# Patient Record
Sex: Male | Born: 1950 | Race: White | Hispanic: No | Marital: Single | Smoking: Current every day smoker
Health system: Southern US, Community
[De-identification: ages and names within clinical notes are randomized; demographics above are authoritative.]

## PROBLEM LIST (undated history)

## (undated) DIAGNOSIS — J45909 Unspecified asthma, uncomplicated: Secondary | ICD-10-CM

## (undated) DIAGNOSIS — D649 Anemia, unspecified: Secondary | ICD-10-CM

## (undated) DIAGNOSIS — M549 Dorsalgia, unspecified: Secondary | ICD-10-CM

## (undated) DIAGNOSIS — Z5189 Encounter for other specified aftercare: Secondary | ICD-10-CM

## (undated) HISTORY — DX: Dorsalgia, unspecified: M54.9

## (undated) HISTORY — DX: Encounter for other specified aftercare: Z51.89

## (undated) HISTORY — DX: Unspecified asthma, uncomplicated: J45.909

## (undated) HISTORY — DX: Anemia, unspecified: D64.9

---

## 1998-10-01 HISTORY — PX: BACK SURGERY: SHX140

## 2006-08-30 ENCOUNTER — Ambulatory Visit: Payer: Self-pay

## 2009-07-15 ENCOUNTER — Emergency Department: Payer: Self-pay | Admitting: Emergency Medicine

## 2012-04-04 DIAGNOSIS — F5104 Psychophysiologic insomnia: Secondary | ICD-10-CM | POA: Insufficient documentation

## 2012-04-04 DIAGNOSIS — F329 Major depressive disorder, single episode, unspecified: Secondary | ICD-10-CM | POA: Insufficient documentation

## 2012-04-04 DIAGNOSIS — G8929 Other chronic pain: Secondary | ICD-10-CM | POA: Insufficient documentation

## 2019-03-30 ENCOUNTER — Inpatient Hospital Stay
Admission: EM | Admit: 2019-03-30 | Discharge: 2019-04-03 | DRG: 812 | Disposition: A | Discharge status: Home / Self Care | Payer: Medicare Other | Attending: Internal Medicine | Admitting: Internal Medicine

## 2019-03-30 ENCOUNTER — Other Ambulatory Visit: Payer: Self-pay

## 2019-03-30 DIAGNOSIS — K621 Rectal polyp: Secondary | ICD-10-CM | POA: Diagnosis present

## 2019-03-30 DIAGNOSIS — F17210 Nicotine dependence, cigarettes, uncomplicated: Secondary | ICD-10-CM | POA: Diagnosis present

## 2019-03-30 DIAGNOSIS — D649 Anemia, unspecified: Secondary | ICD-10-CM | POA: Diagnosis present

## 2019-03-30 DIAGNOSIS — Z79891 Long term (current) use of opiate analgesic: Secondary | ICD-10-CM | POA: Diagnosis not present

## 2019-03-30 DIAGNOSIS — Z79899 Other long term (current) drug therapy: Secondary | ICD-10-CM

## 2019-03-30 DIAGNOSIS — Z880 Allergy status to penicillin: Secondary | ICD-10-CM | POA: Diagnosis not present

## 2019-03-30 DIAGNOSIS — D490 Neoplasm of unspecified behavior of digestive system: Secondary | ICD-10-CM

## 2019-03-30 DIAGNOSIS — R531 Weakness: Secondary | ICD-10-CM | POA: Diagnosis present

## 2019-03-30 DIAGNOSIS — D62 Acute posthemorrhagic anemia: Secondary | ICD-10-CM

## 2019-03-30 DIAGNOSIS — K5730 Diverticulosis of large intestine without perforation or abscess without bleeding: Secondary | ICD-10-CM | POA: Diagnosis not present

## 2019-03-30 DIAGNOSIS — D500 Iron deficiency anemia secondary to blood loss (chronic): Secondary | ICD-10-CM | POA: Diagnosis present

## 2019-03-30 DIAGNOSIS — Z20828 Contact with and (suspected) exposure to other viral communicable diseases: Secondary | ICD-10-CM | POA: Diagnosis present

## 2019-03-30 DIAGNOSIS — K228 Other specified diseases of esophagus: Secondary | ICD-10-CM | POA: Diagnosis present

## 2019-03-30 DIAGNOSIS — K6289 Other specified diseases of anus and rectum: Secondary | ICD-10-CM | POA: Diagnosis not present

## 2019-03-30 DIAGNOSIS — K635 Polyp of colon: Secondary | ICD-10-CM | POA: Diagnosis not present

## 2019-03-30 DIAGNOSIS — D509 Iron deficiency anemia, unspecified: Secondary | ICD-10-CM | POA: Diagnosis not present

## 2019-03-30 LAB — PREPARE RBC (CROSSMATCH)

## 2019-03-30 LAB — CBC WITH DIFFERENTIAL/PLATELET
Abs Immature Granulocytes: 0.04 10*3/uL (ref 0.00–0.07)
Basophils Absolute: 0 10*3/uL (ref 0.0–0.1)
Basophils Relative: 0 %
Eosinophils Absolute: 0 10*3/uL (ref 0.0–0.5)
Eosinophils Relative: 0 %
HCT: 13 % — CL (ref 39.0–52.0)
Hemoglobin: 3.5 g/dL — CL (ref 13.0–17.0)
Immature Granulocytes: 1 %
Lymphocytes Relative: 7 %
Lymphs Abs: 0.5 10*3/uL — ABNORMAL LOW (ref 0.7–4.0)
MCH: 18 pg — ABNORMAL LOW (ref 26.0–34.0)
MCHC: 26.9 g/dL — ABNORMAL LOW (ref 30.0–36.0)
MCV: 67 fL — ABNORMAL LOW (ref 80.0–100.0)
Monocytes Absolute: 0.8 10*3/uL (ref 0.1–1.0)
Monocytes Relative: 11 %
Neutro Abs: 5.7 10*3/uL (ref 1.7–7.7)
Neutrophils Relative %: 81 %
Platelets: 270 10*3/uL (ref 150–400)
RBC: 1.94 MIL/uL — ABNORMAL LOW (ref 4.22–5.81)
RDW: 16.3 % — ABNORMAL HIGH (ref 11.5–15.5)
Smear Review: NORMAL
WBC: 7 10*3/uL (ref 4.0–10.5)
nRBC: 0 % (ref 0.0–0.2)

## 2019-03-30 LAB — COMPREHENSIVE METABOLIC PANEL
ALT: 17 U/L (ref 0–44)
AST: 19 U/L (ref 15–41)
Albumin: 3.5 g/dL (ref 3.5–5.0)
Alkaline Phosphatase: 68 U/L (ref 38–126)
Anion gap: 8 (ref 5–15)
BUN: 12 mg/dL (ref 8–23)
CO2: 22 mmol/L (ref 22–32)
Calcium: 8.3 mg/dL — ABNORMAL LOW (ref 8.9–10.3)
Chloride: 104 mmol/L (ref 98–111)
Creatinine, Ser: 1.07 mg/dL (ref 0.61–1.24)
GFR calc Af Amer: >60 mL/min (ref >60)
GFR calc non Af Amer: >60 mL/min (ref >60)
Glucose, Bld: 119 mg/dL — ABNORMAL HIGH (ref 70–99)
Potassium: 3.7 mmol/L (ref 3.5–5.1)
Sodium: 134 mmol/L — ABNORMAL LOW (ref 135–145)
Total Bilirubin: 0.3 mg/dL (ref 0.3–1.2)
Total Protein: 6.9 g/dL (ref 6.5–8.1)

## 2019-03-30 LAB — ABO/RH: ABO/RH(D): A POS

## 2019-03-30 MED ORDER — SODIUM CHLORIDE 0.9% FLUSH: 3.0000 mL | INTRAVENOUS | Status: DC | PRN

## 2019-03-30 MED ORDER — SODIUM CHLORIDE 0.9 % IV SOLN: INTRAVENOUS | Status: DC

## 2019-03-30 MED ORDER — PNEUMOCOCCAL VAC POLYVALENT 25 MCG/0.5ML IJ INJ: 0.5000 mL | INJECTION | INTRAMUSCULAR | Status: DC

## 2019-03-30 MED ORDER — ONDANSETRON HCL 4 MG/2ML IJ SOLN: 4.0000 mg | Freq: Four times a day (QID) | INTRAMUSCULAR | Status: DC | PRN

## 2019-03-30 MED ORDER — SODIUM CHLORIDE 0.9% FLUSH
3.0000 mL | Freq: Two times a day (BID) | INTRAVENOUS | Status: DC
  Administered 2019-03-30 – 2019-04-03 (×6): 3 mL via INTRAVENOUS

## 2019-03-30 MED ORDER — ONDANSETRON HCL 4 MG PO TABS: 4.0000 mg | ORAL_TABLET | Freq: Four times a day (QID) | ORAL | Status: DC | PRN

## 2019-03-30 MED ORDER — SENNOSIDES-DOCUSATE SODIUM 8.6-50 MG PO TABS: 1.0000 | ORAL_TABLET | Freq: Every evening | ORAL | Status: DC | PRN

## 2019-03-30 MED ORDER — ACETAMINOPHEN 325 MG PO TABS: 650.0000 mg | ORAL_TABLET | Freq: Four times a day (QID) | ORAL | Status: DC | PRN

## 2019-03-30 MED ORDER — ACETAMINOPHEN 325 MG PO TABS
650.0000 mg | ORAL_TABLET | Freq: Once | ORAL | Status: AC
  Administered 2019-03-30: 650 mg via ORAL
  Filled 2019-03-30: qty 2

## 2019-03-30 MED ORDER — ACETAMINOPHEN 650 MG RE SUPP: 650.0000 mg | Freq: Four times a day (QID) | RECTAL | Status: DC | PRN

## 2019-03-30 MED ORDER — PANTOPRAZOLE SODIUM 40 MG IV SOLR
40.0000 mg | Freq: Two times a day (BID) | INTRAVENOUS | Status: DC
  Administered 2019-03-30 – 2019-03-31 (×3): 40 mg via INTRAVENOUS
  Filled 2019-03-30 (×3): qty 40

## 2019-03-30 MED ORDER — SODIUM CHLORIDE 0.9% IV SOLUTION
Freq: Once | INTRAVENOUS | Status: AC
  Administered 2019-03-31: 100 mL via INTRAVENOUS
  Administered 2019-03-31: 150 mL via INTRAVENOUS

## 2019-03-30 MED ORDER — SODIUM CHLORIDE 0.9 % IV SOLN
250.0000 mL | INTRAVENOUS | Status: DC | PRN
  Administered 2019-03-31: 1000 mL via INTRAVENOUS

## 2019-03-30 MED ORDER — DIPHENHYDRAMINE HCL 25 MG PO CAPS
25.0000 mg | ORAL_CAPSULE | Freq: Once | ORAL | Status: AC
  Administered 2019-03-30: 25 mg via ORAL
  Filled 2019-03-30: qty 1

## 2019-03-30 MED ORDER — NICOTINE 21 MG/24HR TD PT24
21.0000 mg | MEDICATED_PATCH | Freq: Every day | TRANSDERMAL | Status: DC
  Administered 2019-03-30 – 2019-04-02 (×4): 21 mg via TRANSDERMAL
  Filled 2019-03-30 (×4): qty 1

## 2019-03-30 MED ORDER — SODIUM CHLORIDE 0.9 % IV SOLN
10.0000 mL/h | Freq: Once | INTRAVENOUS | Status: AC
  Administered 2019-03-30: 10 mL/h via INTRAVENOUS

## 2019-03-30 NOTE — Progress Notes (Signed)
Advanced care plan. Purpose of the Encounter: CODE STATUS Parties in Attendance:Patient Patient's Decision Capacity:Good Subjective/Patient's story: Roberto Collins  is a 68 y.o. male with no significant past medical history presented to the emergency room for generalized weakness.  Patient has fatigue and tiredness.  Also feels dizzy.  He was evaluated in the emergency room hemoglobin is 3.5.  Stool guaiac negative.  No vomiting of blood.  No rectal bleed.  No history of any anemia.  COVID-19 test pending.  No recent travel, sick contacts at home.  No fever chills or cough. Objective/Medical story Needs for blood transfusion intravenously and gastroenterology evaluation.  Will need endoscopy and colonoscopy. Goals of care determination:  Advance care directives and goals of care discussed. Patient wants everything done which includes cpr and intubation and ventilator if need arises CODE STATUS: Full code Time spent discussing advanced care planning: 16 minutes

## 2019-03-30 NOTE — Consult Note (Signed)
[name], MD [address] [place], [place], [place], [address] [id number], [place], [place], [place], [address] Phone: [phone]  Fax: [phone]  Consultation  Referring Provider:     Dr. Estanislado Pandy Primary Care Physician:  Patient, No Pcp Per Reason for Consultation:     Anemia  Date of Admission:  03/30/2019 Date of Consultation:  03/30/2019         HPI:   Roberto Collins is a 68 y.o. male presented with generalized weakness and found to have a hemoglobin of 3.5.  No previous records available. Denies any PMHx. Denies NSAID use.   The patient denies abdominal or flank pain, anorexia, nausea or vomiting, dysphagia, change in bowel habits or black or bloody stools or weight loss.  No prior upper or lower endoscopy.  No family history of colon cancer.  History reviewed. No pertinent past medical history.  Past Surgical History:  Procedure Laterality Date  . BACK SURGERY      Prior to Admission medications   Medication Sig Start Date End Date Taking? Authorizing Provider  chlorproMAZINE (THORAZINE) 100 MG tablet Take 300 mg by mouth Nightly.   Yes [provider]  Cholecalciferol (VITAMIN D-1000 MAX ST) 25 MCG (1000 UT) tablet Take 2,000 Units by mouth daily.   Yes [provider]  cyclobenzaprine (FLEXERIL) 10 MG tablet Take 10 mg by mouth at bedtime.   Yes [provider]  gabapentin (NEURONTIN) 600 MG tablet Take 1,200-1,800 mg by mouth at bedtime.   Yes [provider]  morphine (MS CONTIN) 60 MG 12 hr tablet Take 60 mg by mouth every 12 (twelve) hours.   Yes [provider]  oxyCODONE-acetaminophen (PERCOCET/ROXICET) 5-325 MG tablet Take 1 tablet by mouth 2 (two) times daily as needed. 06/27/09  Yes [provider]    No family history on file.   Social History   Tobacco Use  . Smoking status: Current Some Day Smoker    Packs/day: 0.50    Types: Cigarettes  . Smokeless tobacco: Never Used   Substance Use Topics  . Alcohol use: Never    Frequency: Never  . Drug use: Never    Allergies as of 03/30/2019 - Review Complete 03/30/2019  Allergen Reaction Noted  . Bee venom Anaphylaxis 04/04/2012  . Penicillins  03/30/2019    Review of Systems:    All systems reviewed and negative except where noted in HPI.   Physical Exam:  Vital signs in last 24 hours: Vitals:   03/30/19 1159 03/30/19 1300 03/30/19 1330 03/30/19 1400  BP:  115/60 115/69 118/61  Pulse:  78 73 72  Resp:  13 18 16   SpO2:  100% 100% 100%  Weight: 59 kg     Height: 5\' 9"  (1.753 m)        General:   Pleasant, cooperative in NAD Head:  Normocephalic and atraumatic. Eyes:   No icterus.   Conjunctiva pink. PERRLA. Ears:  Normal auditory acuity. Neck:  Supple; no masses or thyroidomegaly Lungs: Respirations even and unlabored. Lungs clear to auscultation bilaterally.   No wheezes, crackles, or rhonchi.  Abdomen:  Soft, nondistended, nontender. Normal bowel sounds. No appreciable masses or hepatomegaly.  No rebound or guarding.  Neurologic:  Alert and oriented x3;  grossly normal neurologically. Skin:  Intact without significant lesions or rashes. Cervical Nodes:  No significant cervical adenopathy. Psych:  Alert and cooperative. Normal affect.  LAB RESULTS: Recent Labs    03/30/19 1205  WBC 7.0  HGB 3.5*  HCT 13.0*  PLT 270   BMET Recent Labs    03/30/19 1205  NA 134*  K 3.7  CL 104  CO2 22  GLUCOSE 119*  BUN 12  CREATININE 1.07  CALCIUM 8.3*   LFT Recent Labs    03/30/19 1205  PROT 6.9  ALBUMIN 3.5  AST 19  ALT 17  ALKPHOS 68  BILITOT 0.3   PT/INR No results for input(s): LABPROT, INR in the last 72 hours.  STUDIES: No results found.    Impression / Plan:   Roberto Collins is a 68 y.o. y/o male with generalized weakness and admission, found to be acutely anemic with no signs of active GI bleeding  PPI IV twice daily  Continue serial CBCs and transfuse PRN Avoid NSAIDs  Maintain 2 large-bore IV lines Please page GI with any acute hemodynamic changes, or signs of active GI bleeding  Upper endoscopy tomorrow for further evaluation  To prevent dehydration in setting of severe acute anemia, will allow for medical resuscitation with PRBC today before starting colonoscopy prep.  If EGD negative tomorrow, colonoscopy will be needed the next day  I have discussed alternative options, risks & benefits,  which include, but are not limited to, bleeding, infection, perforation,respiratory complication & drug reaction.  The patient agrees with this plan & written consent will be obtained.      Thank you for involving me in the care of this patient.      LOS: 0 days   Virgel Manifold, MD  03/30/2019, 2:41 PM

## 2019-03-30 NOTE — ED Triage Notes (Signed)
Pt reports decreased ability to walk, weakness, dizziness, decreased appetite x2-3 months

## 2019-03-30 NOTE — ED Provider Notes (Addendum)
[facility] Emergency Department Provider Note    ____________________________________________   I have reviewed the triage vital signs and the nursing notes.   HISTORY  Chief Complaint Dizziness and Weakness   History limited by: Not Limited   HPI Roberto Collins is a 68 y.o. male who presents to the emergency department today because of concern for dizziness and weakness. He states the symptoms have been going on for the past 2-3 months. He has found that the symptoms are worse when he tries to walk around. It has been accompanied by shortness of breath. The patient has not had any fevers. Denies any associated chest pain. Has not noticed any bloody or black or tarry stool.    Records reviewed. Per medical record review patient has a history of chronic pain  History reviewed. No pertinent past medical history.  Patient Active Problem List   Diagnosis Date Noted  . Anemia 03/30/2019    Past Surgical History:  Procedure Laterality Date  . BACK SURGERY      Prior to Admission medications   Not on File    Allergies Penicillins  No family history on file.  Social History Social History   Tobacco Use  . Smoking status: Current Some Day Smoker    Packs/day: 0.50    Types: Cigarettes  . Smokeless tobacco: Never Used  Substance Use Topics  . Alcohol use: Never    Frequency: Never  . Drug use: Never    Review of Systems Constitutional: No fever/chills. Positive for weakness.  Eyes: No visual changes. ENT: No sore throat. Cardiovascular: Denies chest pain. Respiratory: Positive for shortness of breath. Gastrointestinal: No abdominal pain.  No nausea, no vomiting.  No diarrhea.   Genitourinary: Negative for dysuria. Musculoskeletal: Positive for chronic back pain. Skin: Negative for rash. Neurological: Negative for headaches, focal weakness or numbness.  ____________________________________________   PHYSICAL EXAM:  VITAL  SIGNS: ED Triage Vitals  Enc Vitals Group     BP 03/30/19 1300 115/60     Pulse Rate 03/30/19 1300 78     Resp 03/30/19 1300 13     Temp --      Temp src --      SpO2 03/30/19 1300 100 %     Weight 03/30/19 1159 130 lb (59 kg)     Height 03/30/19 1159 5\' 9"  (1.753 m)     Head Circumference --      Peak Flow --      Pain Score 03/30/19 1158 7   Constitutional: Alert and oriented.  Eyes: Conjunctivae are normal.  ENT      Head: Normocephalic and atraumatic.      Nose: No congestion/rhinnorhea.      Mouth/Throat: Mucous membranes are moist and pale.       Neck: No stridor. Hematological/Lymphatic/Immunilogical: No cervical lymphadenopathy. Cardiovascular: Normal rate, regular rhythm.  No murmurs, rubs, or gallops. Respiratory: Normal respiratory effort without tachypnea nor retractions. Breath sounds are clear and equal bilaterally. No wheezes/rales/rhonchi. Gastrointestinal: Soft and non tender. No rebound. No guarding.  Rectal: GUIAC negative Musculoskeletal: Normal range of motion in all extremities. No lower extremity edema. Neurologic:  Normal speech and language. No gross focal neurologic deficits are appreciated.  Skin:  Skin is warm, dry and intact. No rash noted. Psychiatric: Mood and affect are normal. Speech and behavior are normal. Patient exhibits appropriate insight and judgment.  ____________________________________________    LABS (pertinent positives/negatives)  CBC wbc 7.0, hgb 3.5, plt 270 CMP na 134,  k 3.7, glu 119, cr 1.07  ____________________________________________   EKG  I, Nance Pear, attending physician, personally viewed and interpreted this EKG  EKG Time: 1201 Rate: 77 Rhythm: sinus rhythm with sinus arrythmia Axis: normal Intervals: qtc 441 QRS: narrow ST changes: no st elevation Impression: abnormal ekg   ____________________________________________     RADIOLOGY  None  ____________________________________________   PROCEDURES  Procedures  CRITICAL CARE Performed by: Nance Pear   Total critical care time: 20 minutes  Critical care time was exclusive of separately billable procedures and treating other patients.  Critical care was necessary to treat or prevent imminent or life-threatening deterioration.  Critical care was time spent personally by me on the following activities: development of treatment plan with patient and/or surrogate as well as nursing, discussions with consultants, evaluation of patient's response to treatment, examination of patient, obtaining history from patient or surrogate, ordering and performing treatments and interventions, ordering and review of laboratory studies, ordering and review of radiographic studies, pulse oximetry and re-evaluation of patient's condition.  ____________________________________________   INITIAL IMPRESSION / ASSESSMENT AND PLAN / ED COURSE  Pertinent labs & imaging results that were available during my care of the patient were reviewed by me and considered in my medical decision making (see chart for details).   Patient presented to the emergency department today because of concerns for weakness and shortness of breath.  Patient's blood work was notable for severe anemia.  Guaiac negative.  Do think this likely is been ongoing for the past 2 to 3 months.  Discussed this finding with the patient.  Did consent patient for blood transfusion.  Will plan on admission. ____________________________________________   FINAL CLINICAL IMPRESSION(S) / ED DIAGNOSES  Final diagnoses:  Weakness  Anemia, unspecified type     Note: This dictation was prepared with Dragon dictation. Any transcriptional errors that result from this process are unintentional     Nance Pear, MD 03/30/19 1353    Nance Pear, MD 04/06/19 [phone]

## 2019-03-30 NOTE — Progress Notes (Signed)
Patient temp of 99.59F was communicated to Dr. Jannifer Franklin post 1 unit of RBC transfusion. Tylenol and Benadryl given per order before giving remaining 2 units of blood.

## 2019-03-30 NOTE — H&P (Addendum)
[facility] [place] NAME: Roberto Collins    MR#:  [id number]  DATE OF BIRTH:  [birth year]  DATE OF ADMISSION:  03/30/2019  PRIMARY CARE PHYSICIAN: No primary care provider on file.   REQUESTING/REFERRING PHYSICIAN:   CHIEF COMPLAINT:   Chief Complaint  Patient presents with  . Dizziness  . Weakness    HISTORY OF PRESENT ILLNESS: Roberto Collins  is a 68 y.o. male with no significant past medical history presented to the emergency room for generalized weakness.  Patient has fatigue and tiredness.  Also feels dizzy.  He was evaluated in the emergency room hemoglobin is 3.5.  Stool guaiac negative.  No vomiting of blood.  No rectal bleed.  No history of any anemia.  COVID-19 test pending.  No recent travel, sick contacts at home.  No fever chills or cough.  PAST MEDICAL HISTORY:  No history of copd, diabetes, hypertension  PAST SURGICAL HISTORY:  Past Surgical History:  Procedure Laterality Date  . BACK SURGERY      SOCIAL HISTORY:  Social History   Tobacco Use  . Smoking status: Current Some Day Smoker    Packs/day: 0.50    Types: Cigarettes  . Smokeless tobacco: Never Used  Substance Use Topics  . Alcohol use: Never    Frequency: Never    FAMILY HISTORY: No family history on file.  DRUG ALLERGIES:  Allergies  Allergen Reactions  . Penicillins     REVIEW OF SYSTEMS:   CONSTITUTIONAL: No fever, has fatigue and weakness.  EYES: No blurred or double vision.  EARS, NOSE, AND THROAT: No tinnitus or ear pain.  RESPIRATORY: No cough, shortness of breath, wheezing or hemoptysis.  CARDIOVASCULAR: No chest pain, orthopnea, edema.  GASTROINTESTINAL: No nausea, vomiting, diarrhea or abdominal pain.  GENITOURINARY: No dysuria, hematuria.  ENDOCRINE: No polyuria, nocturia,  HEMATOLOGY: No anemia, easy bruising or bleeding SKIN: No rash or lesion. MUSCULOSKELETAL: No joint pain or arthritis.   NEUROLOGIC: No tingling, numbness,  weakness.  PSYCHIATRY: No anxiety or depression.   MEDICATIONS AT HOME:  Prior to Admission medications   Not on File      PHYSICAL EXAMINATION:   VITAL SIGNS: Blood pressure 115/69, pulse 73, resp. rate 18, height 5\' 9"  (1.753 m), weight 59 kg, SpO2 100 %.  GENERAL:  69 y.o.-year-old patient lying in the bed with no acute distress.  EYES: Pupils equal, round, reactive to light and accommodation. No scleral icterus. Extraocular muscles intact.  HEENT: Head atraumatic, normocephalic. Oropharynx and nasopharynx clear.  Pallor present. NECK:  Supple, no jugular venous distention. No thyroid enlargement, no tenderness.  LUNGS: Normal breath sounds bilaterally, no wheezing, rales,rhonchi or crepitation. No use of accessory muscles of respiration.  CARDIOVASCULAR: S1, S2 normal. No murmurs, rubs, or gallops.  ABDOMEN: Soft, nontender, nondistended. Bowel sounds present. No organomegaly or mass.  EXTREMITIES: No pedal edema, cyanosis, or clubbing.  NEUROLOGIC: Cranial nerves II through XII are intact. Muscle strength 5/5 in all extremities. Sensation intact. Gait not checked.  PSYCHIATRIC: The patient is alert and oriented x 3.  SKIN: No obvious rash, lesion, or ulcer.   LABORATORY PANEL:   CBC Recent Labs  Lab 03/30/19 1205  WBC 7.0  HGB 3.5*  HCT 13.0*  PLT 270  MCV 67.0*  MCH 18.0*  MCHC 26.9*  RDW 16.3*  LYMPHSABS PENDING  MONOABS PENDING  EOSABS PENDING  BASOSABS PENDING   ------------------------------------------------------------------------------------------------------------------  Chemistries  Recent Labs  Lab 03/30/19 1205  NA 134*  K 3.7  CL 104  CO2 22  GLUCOSE 119*  BUN 12  CREATININE 1.07  CALCIUM 8.3*  AST 19  ALT 17  ALKPHOS 68  BILITOT 0.3   ------------------------------------------------------------------------------------------------------------------ estimated creatinine clearance is 56.7 mL/min (by C-G formula based on SCr of 1.07  mg/dL). ------------------------------------------------------------------------------------------------------------------ No results for input(s): TSH, T4TOTAL, T3FREE, THYROIDAB in the last 72 hours.  Invalid input(s): FREET3   Coagulation profile No results for input(s): INR, PROTIME in the last 168 hours. ------------------------------------------------------------------------------------------------------------------- No results for input(s): DDIMER in the last 72 hours. -------------------------------------------------------------------------------------------------------------------  Cardiac Enzymes No results for input(s): CKMB, TROPONINI, MYOGLOBIN in the last 168 hours.  Invalid input(s): CK ------------------------------------------------------------------------------------------------------------------ Invalid input(s): POCBNP  ---------------------------------------------------------------------------------------------------------------  Urinalysis No results found for: COLORURINE, APPEARANCEUR, LABSPEC, PHURINE, GLUCOSEU, HGBUR, BILIRUBINUR, KETONESUR, PROTEINUR, UROBILINOGEN, NITRITE, LEUKOCYTESUR   RADIOLOGY: No results found.  EKG: Orders placed or performed during the hospital encounter of 03/30/19  . EKG 12-Lead  . EKG 12-Lead  . ED EKG  . ED EKG    IMPRESSION AND PLAN: 67 year old male patient with no significant past medical history presented to the emergency room for generalized weakness.  Patient has fatigue and tiredness.  Also feels dizzy.  -Symptomatic anemia Admit patient to medical floor Transfused PRBC IV Monitor hemoglobin hematocrit Gastroenterology consult Will rule out GI loss Check with endoscopy colonoscopy  -Dizziness and giddiness secondary to anemia  -DVT prophylaxis subcu Lovenox daily  -Tobacco abuse Tobacco cessation counseled to the patient for 6 minutes Nicotine patch offered  -Covid 19 test pending results  All the  records are reviewed and case discussed with ED provider. Management plans discussed with the patient, family and they are in agreement.  CODE STATUS:Full code  TOTAL TIME TAKING CARE OF THIS PATIENT: 52 minutes.    Saundra Shelling M.D on 03/30/2019 at 1:47 PM  Between 7am to 6pm - Pager - [phone]  After 6pm go to [email or web address] - password [email or web address]  [facility]  Office  [phone]  CC: Primary care physician; No primary care provider on file.

## 2019-03-30 NOTE — ED Notes (Signed)
Date and time results received: 03/30/19   Test: hematocrit hemoglobin  Critical Value: hematocrit 13 Hemoglobin-3.5  Name of Provider Notified: goodman MD  Orders Received? Or Actions Taken?: MD notified

## 2019-03-30 NOTE — ED Notes (Signed)
ED TO INPATIENT HANDOFF REPORT  ED Nurse Name and Phone #: Trease Bremner [phone]  S Name/Age/Gender Roberto Collins 68 y.o. male Room/Bed: ED11A/ED11A  Code Status   Code Status: Not on file  Home/SNF/Other home Patient oriented x 4 Is this baseline? yes  Triage Complete: Triage complete  Chief Complaint dizziness, weakness  Triage Note Pt reports decreased ability to walk, weakness, dizziness, decreased appetite x2-3 months    Allergies Allergies  Allergen Reactions  . Bee Venom Anaphylaxis  . Penicillins     Level of Care/Admitting Diagnosis ED Disposition    ED Disposition Condition [facility] Area: [place] [100120]  Level of Care: Med-Surg [16]  Covid Evaluation: Person Under Investigation (PUI)  Isolation Risk Level: Low Risk/Droplet (Less than 4L [place] supplementation)  Diagnosis: Anemia [426834]  Admitting Physician: Saundra Shelling [[id number]]  Attending Physician: Saundra Shelling [[id number]]  Estimated length of stay: past midnight tomorrow  Certification:: I certify this patient will need inpatient services for at least 2 midnights  PT Class (Do Not Modify): Inpatient [101]  PT Acc Code (Do Not Modify): Private [1]       B Medical/Surgery History History reviewed. No pertinent past medical history. Past Surgical History:  Procedure Laterality Date  . BACK SURGERY       A IV Location/Drains/Wounds Patient Lines/Drains/Airways Status   Active Line/Drains/Airways    Name:   Placement date:   Placement time:   Site:   Days:   Peripheral IV 03/30/19 Left Forearm   03/30/19    1307    Forearm   less than 1   Peripheral IV 03/30/19 Right Forearm   03/30/19    1312    Forearm   less than 1          Intake/Output Last 24 hours No intake or output data in the 24 hours ending 03/30/19 1512  Labs/Imaging Results for orders placed or performed during the hospital encounter of 03/30/19 (from the past 48 hour(s))  CBC with  Differential     Status: Abnormal   Collection Time: 03/30/19 12:05 PM  Result Value Ref Range   WBC 7.0 4.0 - 10.5 K/uL   RBC 1.94 (L) 4.22 - 5.81 MIL/uL   Hemoglobin 3.5 (LL) 13.0 - 17.0 g/dL    Comment: Reticulocyte Hemoglobin testing may be clinically indicated, consider ordering this additional test [id number] THIS CRITICAL RESULT HAS VERIFIED AND BEEN CALLED TO LAURIE Maelynn Moroney BY TORITSEJU KPENOSEN ON 06 29 2020 AT 1238, AND HAS BEEN READ BACK.     HCT 13.0 (LL) 39.0 - 52.0 %    Comment: This critical result has verified and been called to LAURIE Isaic Syler by Toritseju Kpenosen on 06 29 2020 at 1238, and has been read back.    MCV 67.0 (L) 80.0 - 100.0 fL   MCH 18.0 (L) 26.0 - 34.0 pg   MCHC 26.9 (L) 30.0 - 36.0 g/dL   RDW 16.3 (H) 11.5 - 15.5 %   Platelets 270 150 - 400 K/uL   nRBC 0.0 0.0 - 0.2 %   Neutrophils Relative % 81 %   Neutro Abs 5.7 1.7 - 7.7 K/uL   Lymphocytes Relative 7 %   Lymphs Abs 0.5 (L) 0.7 - 4.0 K/uL   Monocytes Relative 11 %   Monocytes Absolute 0.8 0.1 - 1.0 K/uL   Eosinophils Relative 0 %   Eosinophils Absolute 0.0 0.0 - 0.5 K/uL   Basophils Relative 0 %   Basophils  Absolute 0.0 0.0 - 0.1 K/uL   WBC Morphology MORPHOLOGY UNREMARKABLE    Smear Review Normal platelet morphology    Immature Granulocytes 1 %   Abs Immature Granulocytes 0.04 0.00 - 0.07 K/uL   Schistocytes PRESENT    Stomatocytes PRESENT     Comment: Performed at [facility], [address] [place], [place] [address]  Comprehensive metabolic panel     Status: Abnormal   Collection Time: 03/30/19 12:05 PM  Result Value Ref Range   Sodium 134 (L) 135 - 145 mmol/L   Potassium 3.7 3.5 - 5.1 mmol/L   Chloride 104 98 - 111 mmol/L   CO2 22 22 - 32 mmol/L   Glucose, Bld 119 (H) 70 - 99 mg/dL   BUN 12 8 - 23 mg/dL   Creatinine, Ser 1.07 0.61 - 1.24 mg/dL   Calcium 8.3 (L) 8.9 - 10.3 mg/dL   Total Protein 6.9 6.5 - 8.1 g/dL   Albumin 3.5 3.5 - 5.0 g/dL   AST 19 15 - 41 U/L   ALT  17 0 - 44 U/L   Alkaline Phosphatase 68 38 - 126 U/L   Total Bilirubin 0.3 0.3 - 1.2 mg/dL   GFR calc non Af Amer >60 >60 mL/min   GFR calc Af Amer >60 >60 mL/min   Anion gap 8 5 - 15    Comment: Performed at [facility], [address], [place], [place] [address]  ABO/Rh     Status: None   Collection Time: 03/30/19 12:05 PM  Result Value Ref Range   ABO/RH(D)      A POS Performed at [facility], [address], [place] [address], [place] [address]   Prepare RBC     Status: None   Collection Time: 03/30/19  1:08 PM  Result Value Ref Range   Order Confirmation      ORDER PROCESSED BY BLOOD BANK Performed at [facility], [address] [place], [place] [address]   Type and screen [place]     Status: None (Preliminary result)   Collection Time: 03/30/19  1:08 PM  Result Value Ref Range   ABO/RH(D) A POS    Antibody Screen NEG    Sample Expiration 04/02/2019,2359    Unit Number [id number]    Blood Component Type RED CELLS,LR    Unit division 00    Status of Unit ALLOCATED    Transfusion Status OK TO TRANSFUSE    Crossmatch Result      Compatible Performed at [facility], [address] [place],  [address]    Unit Number [id number]    Blood Component Type RBC LR PHER2    Unit division 00    Status of Unit ALLOCATED    Transfusion Status OK TO TRANSFUSE    Crossmatch Result Compatible    No results found.  Pending Labs Unresulted Labs (From admission, onward)    Start     Ordered   03/30/19 1308  Novel Coronavirus,NAA,(SEND-OUT TO REF LAB - TAT 24-48 hrs); Hosp Order  (Symptomatic Patients Labs with Precautions )  Once,   STAT    Question:  Current symptoms  Answer:  Other (testing not indicated)   03/30/19 1308   03/30/19 1203  Urinalysis, Complete w Microscopic  ONCE - STAT,   STAT     03/30/19 1204   Signed and Held  HIV antibody (Routine Testing)  Once,   R     Signed and Held  Signed and  Held  Basic metabolic panel  Tomorrow morning,   R     Signed and Held   Signed and Held  CBC  Tomorrow morning,   R     Signed and Held          Vitals/Pain Today's Vitals   03/30/19 1159 03/30/19 1300 03/30/19 1330 03/30/19 1400  BP:  115/60 115/69 118/61  Pulse:  78 73 72  Resp:  13 18 16   SpO2:  100% 100% 100%  Weight: 59 kg     Height: 5\' 9"  (1.753 m)     PainSc:        Isolation Precautions Airborne and Contact precautions  Medications Medications  0.9 %  sodium chloride infusion (has no administration in time range)  nicotine (NICODERM CQ - dosed in mg/24 hours) patch 21 mg (has no administration in time range)  pantoprazole (PROTONIX) injection 40 mg (has no administration in time range)    Mobility High fall risk   Focused Assessments    R Recommendations: See Admitting Provider Note  Report given to:   Additional Notes:

## 2019-03-30 NOTE — ED Notes (Signed)
Attempted to call report, unable to give report at this time  

## 2019-03-31 ENCOUNTER — Inpatient Hospital Stay: Payer: Medicare Other | Admitting: Anesthesiology

## 2019-03-31 ENCOUNTER — Encounter
Admission: EM | Disposition: A | Discharge status: Home / Self Care | Payer: Self-pay | Source: Home / Self Care | Attending: Internal Medicine

## 2019-03-31 ENCOUNTER — Encounter: Payer: Self-pay | Admitting: Anesthesiology

## 2019-03-31 DIAGNOSIS — D509 Iron deficiency anemia, unspecified: Secondary | ICD-10-CM

## 2019-03-31 HISTORY — PX: ESOPHAGOGASTRODUODENOSCOPY: SHX5428

## 2019-03-31 LAB — BASIC METABOLIC PANEL
Anion gap: 5 (ref 5–15)
BUN: 16 mg/dL (ref 8–23)
CO2: 24 mmol/L (ref 22–32)
Calcium: 8 mg/dL — ABNORMAL LOW (ref 8.9–10.3)
Chloride: 105 mmol/L (ref 98–111)
Creatinine, Ser: 0.87 mg/dL (ref 0.61–1.24)
GFR calc Af Amer: >60 mL/min (ref >60)
GFR calc non Af Amer: >60 mL/min (ref >60)
Glucose, Bld: 94 mg/dL (ref 70–99)
Potassium: 3.9 mmol/L (ref 3.5–5.1)
Sodium: 134 mmol/L — ABNORMAL LOW (ref 135–145)

## 2019-03-31 LAB — NOVEL CORONAVIRUS, NAA (HOSP ORDER, SEND-OUT TO REF LAB; TAT 18-24 HRS): SARS-CoV-2, NAA: NOT DETECTED

## 2019-03-31 LAB — CBC
HCT: 21.7 % — ABNORMAL LOW (ref 39.0–52.0)
Hemoglobin: 6.9 g/dL — ABNORMAL LOW (ref 13.0–17.0)
MCH: 24.3 pg — ABNORMAL LOW (ref 26.0–34.0)
MCHC: 31.8 g/dL (ref 30.0–36.0)
MCV: 76.4 fL — ABNORMAL LOW (ref 80.0–100.0)
Platelets: 163 10*3/uL (ref 150–400)
RBC: 2.84 MIL/uL — ABNORMAL LOW (ref 4.22–5.81)
RDW: 21.3 % — ABNORMAL HIGH (ref 11.5–15.5)
WBC: 4.7 10*3/uL (ref 4.0–10.5)
nRBC: 0.6 % — ABNORMAL HIGH (ref 0.0–0.2)

## 2019-03-31 LAB — RETICULOCYTES
Immature Retic Fract: 10 % (ref 2.3–15.9)
RBC.: 2.83 MIL/uL — ABNORMAL LOW (ref 4.22–5.81)
Retic Count, Absolute: 37.4 10*3/uL (ref 19.0–186.0)
Retic Ct Pct: 1.3 % (ref 0.4–3.1)

## 2019-03-31 LAB — IRON AND TIBC
Iron: 343 ug/dL — ABNORMAL HIGH (ref 45–182)
Saturation Ratios: 89 % — ABNORMAL HIGH (ref 17.9–39.5)
TIBC: 383 ug/dL (ref 250–450)
UIBC: 41 ug/dL

## 2019-03-31 LAB — VITAMIN B12: Vitamin B-12: 386 pg/mL (ref 180–914)

## 2019-03-31 LAB — FERRITIN: Ferritin: 10 ng/mL — ABNORMAL LOW (ref 24–336)

## 2019-03-31 LAB — FOLATE: Folate: 6.8 ng/mL (ref >5.9)

## 2019-03-31 LAB — PREPARE RBC (CROSSMATCH)

## 2019-03-31 SURGERY — EGD (ESOPHAGOGASTRODUODENOSCOPY)
Anesthesia: General | Laterality: Left

## 2019-03-31 MED ORDER — PROPOFOL 500 MG/50ML IV EMUL
INTRAVENOUS | Status: AC
  Filled 2019-03-31: qty 50

## 2019-03-31 MED ORDER — SODIUM CHLORIDE 0.9 % IV SOLN
INTRAVENOUS | Status: DC
  Administered 2019-03-31: 14:00:00 via INTRAVENOUS

## 2019-03-31 MED ORDER — CYCLOBENZAPRINE HCL 10 MG PO TABS
10.0000 mg | ORAL_TABLET | Freq: Every day | ORAL | Status: DC
  Administered 2019-03-31 – 2019-04-02 (×3): 10 mg via ORAL
  Filled 2019-03-31 (×3): qty 1

## 2019-03-31 MED ORDER — GABAPENTIN 600 MG PO TABS
1200.0000 mg | ORAL_TABLET | Freq: Every day | ORAL | Status: DC
  Administered 2019-03-31 – 2019-04-02 (×3): 1800 mg via ORAL
  Filled 2019-03-31 (×3): qty 3

## 2019-03-31 MED ORDER — VITAMIN D3 25 MCG (1000 UNIT) PO TABS
2000.0000 [IU] | ORAL_TABLET | Freq: Every day | ORAL | Status: DC
  Administered 2019-04-01 – 2019-04-03 (×3): 2000 [IU] via ORAL
  Filled 2019-03-31 (×6): qty 2

## 2019-03-31 MED ORDER — SODIUM CHLORIDE 0.9 % IV SOLN
200.0000 mg | Freq: Once | INTRAVENOUS | Status: AC
  Administered 2019-03-31: 16:00:00 200 mg via INTRAVENOUS
  Filled 2019-03-31: qty 10

## 2019-03-31 MED ORDER — PEG 3350-KCL-NA BICARB-NACL 420 G PO SOLR
4000.0000 mL | Freq: Once | ORAL | Status: AC
  Administered 2019-03-31: 4000 mL via ORAL
  Filled 2019-03-31: qty 4000

## 2019-03-31 MED ORDER — FOLIC ACID 1 MG PO TABS
1.0000 mg | ORAL_TABLET | Freq: Every day | ORAL | Status: DC
  Administered 2019-03-31 – 2019-04-03 (×4): 1 mg via ORAL
  Filled 2019-03-31 (×4): qty 1

## 2019-03-31 MED ORDER — PROPOFOL 10 MG/ML IV BOLUS
INTRAVENOUS | Status: DC | PRN
  Administered 2019-03-31: 60 mg via INTRAVENOUS
  Administered 2019-03-31: 40 mg via INTRAVENOUS

## 2019-03-31 MED ORDER — CHLORPROMAZINE HCL 50 MG PO TABS
300.0000 mg | ORAL_TABLET | Freq: Every day | ORAL | Status: DC
  Administered 2019-03-31 – 2019-04-02 (×3): 300 mg via ORAL
  Filled 2019-03-31: qty 6
  Filled 2019-03-31: qty 3
  Filled 2019-03-31: qty 6
  Filled 2019-03-31 (×2): qty 3
  Filled 2019-03-31: qty 6

## 2019-03-31 MED ORDER — SODIUM CHLORIDE 0.9% IV SOLUTION
Freq: Once | INTRAVENOUS | Status: AC
  Administered 2019-04-02: 1000 mL via INTRAVENOUS

## 2019-03-31 MED ORDER — OXYCODONE-ACETAMINOPHEN 5-325 MG PO TABS: 1.0000 | ORAL_TABLET | Freq: Four times a day (QID) | ORAL | Status: DC | PRN

## 2019-03-31 MED ORDER — BISACODYL 5 MG PO TBEC
10.0000 mg | DELAYED_RELEASE_TABLET | Freq: Once | ORAL | Status: AC
  Administered 2019-03-31: 14:00:00 10 mg via ORAL
  Filled 2019-03-31 (×2): qty 2

## 2019-03-31 MED ORDER — PROPOFOL 500 MG/50ML IV EMUL
INTRAVENOUS | Status: DC | PRN
  Administered 2019-03-31: 160 ug/kg/min via INTRAVENOUS

## 2019-03-31 MED ORDER — PROPOFOL 10 MG/ML IV BOLUS
INTRAVENOUS | Status: AC
  Filled 2019-03-31: qty 20

## 2019-03-31 MED ORDER — MORPHINE SULFATE ER 15 MG PO TBCR
60.0000 mg | EXTENDED_RELEASE_TABLET | Freq: Two times a day (BID) | ORAL | Status: DC
  Administered 2019-03-31 – 2019-04-03 (×7): 60 mg via ORAL
  Filled 2019-03-31 (×7): qty 4

## 2019-03-31 NOTE — Anesthesia Preprocedure Evaluation (Signed)
Anesthesia Evaluation  Patient identified by MRN, date of birth, ID band Patient awake    Reviewed: Allergy & Precautions, NPO status , Patient's Chart, lab work & pertinent test results, reviewed documented beta blocker date and time   Airway Mallampati: II  TM Distance: >3 FB     Dental  (+) Chipped   Pulmonary Current Smoker,           Cardiovascular      Neuro/Psych    GI/Hepatic   Endo/Other    Renal/GU      Musculoskeletal   Abdominal   Peds  Hematology  (+) anemia ,   Anesthesia Other Findings   Reproductive/Obstetrics                             Anesthesia Physical Anesthesia Plan  ASA: II  Anesthesia Plan: General   Post-op Pain Management:    Induction: Intravenous  PONV Risk Score and Plan:   Airway Management Planned:   Additional Equipment:   Intra-op Plan:   Post-operative Plan:   Informed Consent: I have reviewed the patients History and Physical, chart, labs and discussed the procedure including the risks, benefits and alternatives for the proposed anesthesia with the patient or authorized representative who has indicated his/her understanding and acceptance.       Plan Discussed with: CRNA  Anesthesia Plan Comments:         Anesthesia Quick Evaluation

## 2019-03-31 NOTE — Anesthesia Postprocedure Evaluation (Signed)
Anesthesia Post Note  Patient: Roberto Collins  Procedure(s) Performed: ESOPHAGOGASTRODUODENOSCOPY (EGD) (Left )  Patient location during evaluation: Endoscopy Anesthesia Type: General Level of consciousness: awake and alert Pain management: pain level controlled Vital Signs Assessment: post-procedure vital signs reviewed and stable Respiratory status: spontaneous breathing, nonlabored ventilation, respiratory function stable and patient connected to nasal cannula oxygen Cardiovascular status: blood pressure returned to baseline and stable Postop Assessment: no apparent nausea or vomiting Anesthetic complications: no     Last Vitals:  Vitals:   03/31/19 1058 03/31/19 1205  BP: 124/63 (!) 105/49  Pulse: (!) 59 61  Resp: 20 16  Temp: 36.9 C (!) 36.2 C  SpO2: 98% 99%    Last Pain:  Vitals:   03/31/19 1215  TempSrc:   PainSc: 0-No pain                 Leane Loring S

## 2019-03-31 NOTE — Op Note (Signed)
[facility] Gastroenterology Patient Name: Roberto Collins Procedure Date: 03/31/2019 11:34 AM MRN: [id number] Account #: [id number] Date of Birth: 12-01-1950 Admit Type: Inpatient Age: 68 Room: [facility] ENDO ROOM 1 Gender: Male Note Status: Finalized Procedure:            Upper GI endoscopy Indications:          Unexplained iron deficiency anemia Providers:            Noemy Hallmon B. Bonna Gains MD, MD Referring MD:         Forest Gleason Md, MD (Referring MD) Medicines:            Monitored Anesthesia Care Complications:        No immediate complications. Procedure:            Pre-Anesthesia Assessment:                       - Prior to the procedure, a History and Physical was                        performed, and patient medications, allergies and                        sensitivities were reviewed. The patient's tolerance of                        previous anesthesia was reviewed.                       - The risks and benefits of the procedure and the                        sedation options and risks were discussed with the                        patient. All questions were answered and informed                        consent was obtained.                       - Patient identification and proposed procedure were                        verified prior to the procedure by the physician, the                        nurse, the anesthesiologist, the anesthetist and the                        technician. The procedure was verified in the procedure                        room.                       - ASA Grade Assessment: II - A patient with mild                        systemic disease.  After obtaining informed consent, the endoscope was                        passed under direct vision. Throughout the procedure,                        the patient's blood pressure, pulse, and oxygen                        saturations were monitored continuously. The Endoscope                   was introduced through the mouth, and advanced to the                        second part of duodenum. The upper GI endoscopy was                        accomplished with ease. The patient tolerated the                        procedure well. Findings:      The examined esophagus was normal.      The Z-line was irregular. Guidelines recommend biopsy of salmon colored       mucosa extending 1 cm or more. The Z-line in this case was just       irregular and was not 1 cm or more in length and did not need to be       biopsied.      The entire examined stomach was normal.      The duodenal bulb, second portion of the duodenum and examined duodenum       were normal. Impression:           - Normal esophagus.                       - Z-line irregular.                       - Normal stomach.                       - Normal duodenal bulb, second portion of the duodenum                        and examined duodenum.                       - No specimens collected. Recommendation:       - Perform a colonoscopy tomorrow.                       - Clear liquid diet.                       - Continue present medications.                       - Patient has a contact number available for                        emergencies. The signs and symptoms of potential  delayed complications were discussed with the patient.                        Return to normal activities tomorrow. Written discharge                        instructions were provided to the patient.                       - The findings and recommendations were discussed with                        the patient.                       - Continue Serial CBCs and transfuse PRN                       - D/c PPI Procedure Code(s):    --- Professional ---                       [phone], Esophagogastroduodenoscopy, flexible, transoral;                        diagnostic, including collection of specimen(s) by                         brushing or washing, when performed (separate procedure) Diagnosis Code(s):    --- Professional ---                       K22.8, Other specified diseases of esophagus                       D50.9, Iron deficiency anemia, unspecified CPT copyright 2019 American Medical Association. All rights reserved. The codes documented in this report are preliminary and upon coder review may  be revised to meet current compliance requirements.  [name], MD Margretta Sidle B. Bonna Gains MD, MD 03/31/2019 12:13:24 PM This report has been signed electronically. Number of Addenda: 0 Note Initiated On: 03/31/2019 11:34 AM Estimated Blood Loss: Estimated blood loss: none.      [facility]

## 2019-03-31 NOTE — Anesthesia Post-op Follow-up Note (Signed)
Anesthesia QCDR form completed.        

## 2019-03-31 NOTE — TOC Initial Note (Signed)
Transition of Care [facility]) - Initial/Assessment Note    Patient Details  Name: Roberto Collins MRN: [id number] Date of Birth: 27-Jan-1951  Transition of Care [facility]) CM/SW Contact:    Elza Rafter, RN Phone Number: 03/31/2019, 2:28 PM  Clinical Narrative:   Patient is from home alone.  He states he has been going down hill and is not able to care for himself at home.  He has chronic back problems and is current with his pain doctor-Dr. Luetta Nutting.  [facility] [place] [facility] difficulty.  He does not have any DME at home.  It was reported from RN that patient has bed bugs in the home and his home is being exterminated this week.  His Niece is his support person.  Obtained PASSR and completed FL2.  Pending PT evaluation.                       Patient Goals and CMS Choice        Expected Discharge Plan and Services                                                Prior Living Arrangements/Services                       Activities of Daily Living Home Assistive Devices/Equipment: None ADL Screening (condition at time of admission) Patient's cognitive ability adequate to safely complete daily activities?: Yes Is the patient deaf or have difficulty hearing?: No Does the patient have difficulty seeing, even when wearing glasses/contacts?: No Does the patient have difficulty concentrating, remembering, or making decisions?: No Patient able to express need for assistance with ADLs?: Yes Does the patient have difficulty dressing or bathing?: Yes Independently performs ADLs?: No Communication: Independent Is this a change from baseline?: Change from baseline, expected to last >3 days Dressing (OT): Needs assistance Is this a change from baseline?: Change from baseline, expected to last >3 days Grooming: Needs assistance Is this a change from baseline?: Change from baseline, expected to last >3 days Feeding: Independent Bathing: Needs  assistance Is this a change from baseline?: Change from baseline, expected to last >3 days Toileting: Needs assistance Is this a change from baseline?: Change from baseline, expected to last >3days In/Out Bed: Needs assistance Is this a change from baseline?: Change from baseline, expected to last >3 days Walks in Home: Needs assistance Is this a change from baseline?: Change from baseline, expected to last >3 days Does the patient have difficulty walking or climbing stairs?: Yes Weakness of Legs: Both Weakness of Arms/Hands: None  Permission Sought/Granted                  Emotional Assessment              Admission diagnosis:  Weakness [R53.1] Anemia, unspecified type [D64.9] Patient Active Problem List   Diagnosis Date Noted  . Anemia 03/30/2019   PCP:  Patient, No Pcp Per Pharmacy:   [id number] [place], [place] [place] [address] Phone: [phone] Fax: [phone]     Social Determinants of Health (SDOH) Interventions    Readmission Risk Interventions No flowsheet data found.

## 2019-03-31 NOTE — Progress Notes (Addendum)
Patient was admitted with c/o dizziness and weakness. Blood work revealed H/H=3.5/13. A the beginning of the shift patient has a unit of blood transfusing and was completed . Patient tolerated the transfusion. Per order another 2 units of RBC was transfused. Patient was able to tolerate the transfusion w/o any suspected reactions. All blood was started and completed per policy.  Patient has no acute event overnight, patient remained NPO per order.

## 2019-03-31 NOTE — Plan of Care (Signed)

## 2019-03-31 NOTE — Transfer of Care (Signed)
Immediate Anesthesia Transfer of Care Note  Patient: Roberto Collins  Procedure(s) Performed: ESOPHAGOGASTRODUODENOSCOPY (EGD) (Left )  Patient Location: PACU  Anesthesia Type:General  Level of Consciousness: drowsy  Airway & Oxygen Therapy: Patient Spontanous Breathing and Patient connected to nasal cannula oxygen  Post-op Assessment: Report given to RN and Post -op Vital signs reviewed and stable  Post vital signs: Reviewed and stable  Last Vitals:  Vitals Value Taken Time  BP 105/49 03/31/19 1206  Temp 36.2 C 03/31/19 1205  Pulse 60 03/31/19 1208  Resp 16 03/31/19 1208  SpO2 99 % 03/31/19 1208  Vitals shown include unvalidated device data.  Last Pain:  Vitals:   03/31/19 1205  TempSrc: Tympanic  PainSc:          Complications: No apparent anesthesia complications

## 2019-03-31 NOTE — NC FL2 (Signed)
[place] LEVEL OF CARE SCREENING TOOL     IDENTIFICATION  Patient Name: Roberto Collins Birthdate: 1950/10/03 Sex: male Admission Date (Current Location): 03/30/2019  [place] and [place] Number:  Engineering geologist and Address:  [facility], [address] [place], [place] [address]      Provider Number: [id number]  Attending Physician Name and Address:  [name], MD  Relative Name and Phone Number:  [name] Relative [phone]    Current Level of Care: Hospital Recommended Level of Care: [place] Prior Approval Number:    Date Approved/Denied:   PASRR Number: [id number]  Discharge Plan: SNF    Current Diagnoses: Patient Active Problem List   Diagnosis Date Noted  . Anemia 03/30/2019    Orientation RESPIRATION BLADDER Height & Weight     Self, Time, Situation  Normal Continent Weight: 52.2 kg Height:  5\' 9"  (175.3 cm)  BEHAVIORAL SYMPTOMS/MOOD NEUROLOGICAL BOWEL NUTRITION STATUS      Continent Diet(Currently on Clears)  AMBULATORY STATUS COMMUNICATION OF NEEDS Skin   Limited Assist Verbally Normal                       Personal Care Assistance Level of Assistance  Bathing, Feeding, Dressing Bathing Assistance: Limited assistance Feeding assistance: Independent Dressing Assistance: Limited assistance     Functional Limitations Info  Sight, Hearing, Speech Sight Info: Adequate Hearing Info: Adequate Speech Info: Adequate    SPECIAL CARE FACTORS FREQUENCY  PT (By licensed PT)     PT Frequency: 5x week              Contractures Contractures Info: Not present    Additional Factors Info  Code Status, Allergies Code Status Info: full Allergies Info: bees; PCN           Current Medications (03/31/2019):  This is the current hospital active medication list Current Facility-Administered Medications  Medication Dose Route Frequency Provider Last Rate Last Dose  . 0.9  %  sodium chloride infusion (Manually program via Guardrails IV Fluids)   Intravenous Once [name], MD      . 0.9 %  sodium chloride infusion  250 mL Intravenous PRN Saundra Shelling, MD 50 mL/hr at 03/31/19 1104 1,000 mL at 03/31/19 1104  . acetaminophen (TYLENOL) tablet 650 mg  650 mg Oral Q6H PRN Saundra Shelling, MD       Or  . acetaminophen (TYLENOL) suppository 650 mg  650 mg Rectal Q6H PRN Pyreddy, Reatha Harps, MD      . chlorproMAZINE (THORAZINE) tablet 300 mg  300 mg Oral QHS [name], MD      . Derrill Memo ON 04/01/2019] cholecalciferol (VITAMIN D) tablet 2,000 Units  2,000 Units Oral Daily [name], MD      . cyclobenzaprine (FLEXERIL) tablet 10 mg  10 mg Oral QHS [name], MD      . folic acid (FOLVITE) tablet 1 mg  1 mg Oral Daily [name], MD      . gabapentin (NEURONTIN) tablet 1,200-1,800 mg  1,200-1,800 mg Oral QHS [name], MD      . iron sucrose (VENOFER) 200 mg in sodium chloride 0.9 % 150 mL IVPB  200 mg Intravenous Once [name], MD      . morphine (MS CONTIN) 12 hr tablet 60 mg  60 mg Oral Q12H [name], MD      . nicotine (NICODERM CQ - dosed in mg/24 hours) patch 21 mg  21 mg Transdermal Daily Saundra Shelling, MD   21 mg at 03/30/19 1616  . ondansetron (ZOFRAN) tablet 4 mg  4 mg Oral Q6H PRN Pyreddy, Reatha Harps, MD       Or  . ondansetron (ZOFRAN) injection 4 mg  4 mg Intravenous Q6H PRN Pyreddy, Pavan, MD      . oxyCODONE-acetaminophen (PERCOCET/ROXICET) 5-325 MG per tablet 1 tablet  1 tablet Oral Q6H PRN [name], MD      . pneumococcal 23 valent vaccine (PNU-IMMUNE) injection 0.5 mL  0.5 mL Intramuscular Tomorrow-1000 Pyreddy, Pavan, MD      . polyethylene glycol-electrolytes (NuLYTELY/GoLYTELY) solution 4,000 mL  4,000 mL Oral Once [name] B, MD      . senna-docusate (Senokot-S) tablet 1 tablet  1 tablet Oral QHS PRN Pyreddy, Reatha Harps, MD      . sodium chloride flush (NS) 0.9 % injection 3 mL  3 mL Intravenous Q12H  Saundra Shelling, MD   3 mL at 03/30/19 2119  . sodium chloride flush (NS) 0.9 % injection 3 mL  3 mL Intravenous PRN Saundra Shelling, MD         Discharge Medications: Please see discharge summary for a list of discharge medications.  Relevant Imaging Results:  Relevant Lab Results:   Additional Information Geoffry Paradise, RN  Elza Rafter, RN

## 2019-03-31 NOTE — Progress Notes (Signed)
Initial Nutrition Assessment  DOCUMENTATION CODES:   Underweight  INTERVENTION:   RD will add supplements once diet advanced  Pt likely at refeed risk   NUTRITION DIAGNOSIS:   Inadequate oral intake related to acute illness as evidenced by NPO status  GOAL:   Patient will meet greater than or equal to 90% of their needs  MONITOR:   Diet advancement, Labs, Weight trends, Skin, I & O's  REASON FOR ASSESSMENT:   Malnutrition Screening Tool    ASSESSMENT:   68 y.o. y/o male with generalized weakness and admission, found to be acutely anemic with no signs of active GI bleeding  RD working remotely.  Pt reports decreased appetite and oral intake for the past 2-3 months. Pt currently NPO for EGD/colonoscopy today. Pt eating 100% of meals yesterday prior to NPO. Pt denies any recent weight loss. There is no recent weight history in chart to determine if any significant weight loss. RD will add supplements once diet advanced. Pt likely at refeed risk; recommend monitor electrolytes.   Pt at high risk for malnutrition but unable to diagnose at this time as NFPE cannot be performed.   Medications reviewed and include: nicotine, protonix  Labs reviewed: Na 134(L) Hgb 6.9(L), Hct 21.7(L), MCV 76.4(L), MCH 24.3(L)  Unable to complete Nutrition-Focused physical exam at this time.   Diet Order:   Diet Order            Diet NPO time specified Except for: Sips with Meds  Diet effective midnight             EDUCATION NEEDS:   No education needs have been identified at this time  Skin:  Skin Assessment: Reviewed RN Assessment  Last BM:  6/30- type 4  Height:   Ht Readings from Last 1 Encounters:  03/30/19 5\' 9"  (1.753 m)    Weight:   Wt Readings from Last 1 Encounters:  03/30/19 52.2 kg    Ideal Body Weight:  72.7 kg  BMI:  Body mass index is 17 kg/m.  Estimated Nutritional Needs:   Kcal:  1700-2000kcal/day  Protein:  78-88g/day  Fluid:   >1.3L/day  Koleen Distance MS, RD, LDN Pager #- [phone] Office#- [phone] After Hours Pager: [phone]

## 2019-03-31 NOTE — Progress Notes (Signed)
[place] at [facility]                                                                                                                                                                                  Patient Demographics   Fredi Geiler, is a 68 y.o. male, DOB - 1951/04/08, KTG:[id number]  Admit date - 03/30/2019   Admitting Physician Saundra Shelling, MD  Outpatient Primary MD for the patient is Patient, No Pcp Per   LOS - 1  Subjective: Pt feeling better, denies any chest pain or shortness of breath    Review of Systems:   CONSTITUTIONAL: No documented fever. No fatigue, weakness. No weight gain, no weight loss.  EYES: No blurry or double vision.  ENT: No tinnitus. No postnasal drip. No redness of the oropharynx.  RESPIRATORY: No cough, no wheeze, no hemoptysis. No dyspnea.  CARDIOVASCULAR: No chest pain. No orthopnea. No palpitations. No syncope.  GASTROINTESTINAL: No nausea, no vomiting or diarrhea. No abdominal pain. No melena or hematochezia.  GENITOURINARY: No dysuria or hematuria.  ENDOCRINE: No polyuria or nocturia. No heat or cold intolerance.  HEMATOLOGY: No anemia. No bruising. No bleeding.  INTEGUMENTARY: No rashes. No lesions.  MUSCULOSKELETAL: No arthritis. No swelling. No gout.  NEUROLOGIC: No numbness, tingling, or ataxia. No seizure-type activity.  PSYCHIATRIC: No anxiety. No insomnia. No ADD.    Vitals:   Vitals:   03/31/19 0359 03/31/19 0735 03/31/19 1058 03/31/19 1205  BP: 127/70 120/68 124/63 (!) 105/49  Pulse: (!) 59 65 (!) 59 61  Resp: 18  20 16   Temp: 98.9 F (37.2 C) 97.6 F (36.4 C) 98.4 F (36.9 C) (!) 97.2 F (36.2 C)  TempSrc: Oral Oral Tympanic Tympanic  SpO2: 100% 99% 98% 99%  Weight:      Height:        Wt Readings from Last 3 Encounters:  03/30/19 52.2 kg     Intake/Output Summary (Last 24 hours) at 03/31/2019 1415 Last data filed at 03/31/2019 1151 Gross per 24 hour  Intake 830 ml  Output 401 ml  Net  429 ml    Physical Exam:   GENERAL: Pleasant-appearing in no apparent distress.  HEAD, EYES, EARS, NOSE AND THROAT: Atraumatic, normocephalic. Extraocular muscles are intact. Pupils equal and reactive to light. Sclerae anicteric. No conjunctival injection. No oro-pharyngeal erythema.  NECK: Supple. There is no jugular venous distention. No bruits, no lymphadenopathy, no thyromegaly.  HEART: Regular rate and rhythm,. No murmurs, no rubs, no clicks.  LUNGS: Clear to auscultation bilaterally. No rales or rhonchi. No wheezes.  ABDOMEN: Soft, flat, nontender, nondistended. Has good bowel sounds. No hepatosplenomegaly appreciated.  EXTREMITIES: No evidence  of any cyanosis, clubbing, or peripheral edema.  +2 pedal and radial pulses bilaterally.  NEUROLOGIC: The patient is alert, awake, and oriented x3 with no focal motor or sensory deficits appreciated bilaterally.  SKIN: Moist and warm with no rashes appreciated.  Psych: Not anxious, depressed LN: No inguinal LN enlargement    Antibiotics   Anti-infectives (From admission, onward)   None      Medications   Scheduled Meds: . sodium chloride   Intravenous Once  . chlorproMAZINE  300 mg Oral QHS  . [START ON 04/01/2019] cholecalciferol  2,000 Units Oral Daily  . cyclobenzaprine  10 mg Oral QHS  . gabapentin  1,200-1,800 mg Oral QHS  . morphine  60 mg Oral Q12H  . nicotine  21 mg Transdermal Daily  . pneumococcal 23 valent vaccine  0.5 mL Intramuscular Tomorrow-1000  . polyethylene glycol-electrolytes  4,000 mL Oral Once  . sodium chloride flush  3 mL Intravenous Q12H   Continuous Infusions: . sodium chloride 1,000 mL (03/31/19 1104)   PRN Meds:.sodium chloride, acetaminophen **OR** acetaminophen, ondansetron **OR** ondansetron (ZOFRAN) IV, oxyCODONE-acetaminophen, senna-docusate, sodium chloride flush   Data Review:   Micro Results Recent Results (from the past 240 hour(s))  Novel Coronavirus,NAA,(SEND-OUT TO REF LAB - TAT 24-48  hrs); Hosp Order     Status: None   Collection Time: 03/30/19  1:08 PM   Specimen: Nasopharyngeal Swab; Respiratory  Result Value Ref Range Status   SARS-CoV-2, NAA NOT DETECTED NOT DETECTED Final    Comment: (NOTE) This test was developed and its performance characteristics determined by [employer]. This test has not been FDA cleared or approved. This test has been authorized by FDA under an Emergency Use Authorization (EUA). This test is only authorized for the duration of time the declaration that circumstances exist justifying the authorization of the emergency use of in vitro diagnostic tests for detection of SARS-CoV-2 virus and/or diagnosis of COVID-19 infection under section 564(b)(1) of the Act, 21 U.S.C. 627OJJ-0(K)(9), unless the authorization is terminated or revoked sooner. When diagnostic testing is negative, the possibility of a false negative result should be considered in the context of a patient's recent exposures and the presence of clinical signs and symptoms consistent with COVID-19. An individual without symptoms of COVID-19 and who is not shedding SARS-CoV-2 virus would expect to have a negative (not detected) result in this assay. Performed  At: [facility] [place], [place] [id number] [name] MD JI:[id number]    [place]  Final    Comment: Performed at [facility], [address], [place], [place] [address]    Radiology Reports No results found.   CBC Recent Labs  Lab 03/30/19 1205 03/31/19 0624  WBC 7.0 4.7  HGB 3.5* 6.9*  HCT 13.0* 21.7*  PLT 270 163  MCV 67.0* 76.4*  MCH 18.0* 24.3*  MCHC 26.9* 31.8  RDW 16.3* 21.3*  LYMPHSABS 0.5*  --   MONOABS 0.8  --   EOSABS 0.0  --   BASOSABS 0.0  --     Chemistries  Recent Labs  Lab 03/30/19 1205 03/31/19 0624  NA 134* 134*  K 3.7 3.9  CL 104 105  CO2 22 24  GLUCOSE 119* 94  BUN 12 16  CREATININE 1.07 0.87   CALCIUM 8.3* 8.0*  AST 19  --   ALT 17  --   ALKPHOS 68  --   BILITOT 0.3  --    ------------------------------------------------------------------------------------------------------------------ estimated creatinine clearance is 61.7  mL/min (by C-G formula based on SCr of 0.87 mg/dL). ------------------------------------------------------------------------------------------------------------------ No results for input(s): HGBA1C in the last 72 hours. ------------------------------------------------------------------------------------------------------------------ No results for input(s): CHOL, HDL, LDLCALC, TRIG, CHOLHDL, LDLDIRECT in the last 72 hours. ------------------------------------------------------------------------------------------------------------------ No results for input(s): TSH, T4TOTAL, T3FREE, THYROIDAB in the last 72 hours.  Invalid input(s): FREET3 ------------------------------------------------------------------------------------------------------------------ Recent Labs    03/31/19 0624  FOLATE 6.8  FERRITIN 10*  TIBC 383  IRON 343*  RETICCTPCT 1.3    Coagulation profile No results for input(s): INR, PROTIME in the last 168 hours.  No results for input(s): DDIMER in the last 72 hours.  Cardiac Enzymes No results for input(s): CKMB, TROPONINI, MYOGLOBIN in the last 168 hours.  Invalid input(s): CK ------------------------------------------------------------------------------------------------------------------ Invalid input(s): POCBNP    Assessment & Plan   IMPRESSION AND PLAN: 68 year old male patient with no significant past medical history presented to the emergency room for generalized weakness.  Patient has fatigue and tiredness.  Also feels dizzy.  -Symptomatic anemia Patient is, however EGD.  I have sent an anemia panel. He will need to have 1 more unit of transfusion  -Dizziness and giddiness secondary to anemia  -DVT  prophylaxis subcu Lovenox daily  -Tobacco abuse Tobacco cessation       Code Status Orders  (From admission, onward)         Start     Ordered   03/30/19 1604  Full code  Continuous     03/30/19 1603        Code Status History    This patient has a current code status but no historical code status.   Advance Care Planning Activity           Consults GI  DVT Prophylaxis SCDs Lab Results  Component Value Date   PLT 163 03/31/2019     Time Spent in minutes   35 minutes greater than 50% of time spent in care coordination and counseling patient regarding the condition and plan of care.   [name] M.D on 03/31/2019 at 2:15 PM  Between 7am to 6pm - Pager - [phone]  After 6pm go to [email or web address] - Proofreader  [employer]   Office  [phone]

## 2019-03-31 NOTE — OR Nursing (Signed)
REPORT CALLED TO PRIMARY RN ANGELA ON FLOOR. UPPER GI EXAM WNL. PT SCHEDULED FOR COLONOSCOPY IN AM. TO DRINK COLON PREP THIS EVENING AND WITH RESUME CLEAR LIQUIDS WITH NPO AFTER MIDNIGHT . NURSE AND PT INFORMED

## 2019-04-01 ENCOUNTER — Encounter: Payer: Self-pay | Admitting: Anesthesiology

## 2019-04-01 ENCOUNTER — Encounter
Admission: EM | Disposition: A | Discharge status: Home / Self Care | Payer: Self-pay | Source: Home / Self Care | Attending: Internal Medicine

## 2019-04-01 DIAGNOSIS — D649 Anemia, unspecified: Secondary | ICD-10-CM

## 2019-04-01 LAB — URINALYSIS, COMPLETE (UACMP) WITH MICROSCOPIC
Bacteria, UA: NONE SEEN
Bilirubin Urine: NEGATIVE
Glucose, UA: NEGATIVE mg/dL
Hgb urine dipstick: NEGATIVE
Ketones, ur: NEGATIVE mg/dL
Leukocytes,Ua: NEGATIVE
Nitrite: NEGATIVE
Protein, ur: NEGATIVE mg/dL
Specific Gravity, Urine: 1.009 (ref 1.005–1.030)
Squamous Epithelial / HPF: NONE SEEN (ref 0–5)
pH: 7 (ref 5.0–8.0)

## 2019-04-01 LAB — TYPE AND SCREEN
ABO/RH(D): A POS
Antibody Screen: NEGATIVE
Unit division: 0
Unit division: 0
Unit division: 0
Unit division: 0

## 2019-04-01 LAB — CBC WITH DIFFERENTIAL/PLATELET
Abs Immature Granulocytes: 0.03 10*3/uL (ref 0.00–0.07)
Basophils Absolute: 0 10*3/uL (ref 0.0–0.1)
Basophils Relative: 1 %
Eosinophils Absolute: 0 10*3/uL (ref 0.0–0.5)
Eosinophils Relative: 0 %
HCT: 26.2 % — ABNORMAL LOW (ref 39.0–52.0)
Hemoglobin: 8.3 g/dL — ABNORMAL LOW (ref 13.0–17.0)
Immature Granulocytes: 1 %
Lymphocytes Relative: 21 %
Lymphs Abs: 0.8 10*3/uL (ref 0.7–4.0)
MCH: 24.4 pg — ABNORMAL LOW (ref 26.0–34.0)
MCHC: 31.7 g/dL (ref 30.0–36.0)
MCV: 77.1 fL — ABNORMAL LOW (ref 80.0–100.0)
Monocytes Absolute: 0.5 10*3/uL (ref 0.1–1.0)
Monocytes Relative: 13 %
Neutro Abs: 2.5 10*3/uL (ref 1.7–7.7)
Neutrophils Relative %: 64 %
Platelets: 155 10*3/uL (ref 150–400)
RBC: 3.4 MIL/uL — ABNORMAL LOW (ref 4.22–5.81)
RDW: 20.9 % — ABNORMAL HIGH (ref 11.5–15.5)
WBC: 3.9 10*3/uL — ABNORMAL LOW (ref 4.0–10.5)
nRBC: 0 % (ref 0.0–0.2)

## 2019-04-01 LAB — BPAM RBC
Blood Product Expiration Date: 202006302359
Blood Product Expiration Date: 202006302359
Blood Product Expiration Date: 202007042359
Blood Product Expiration Date: 202007162359
ISSUE DATE / TIME: 202006291644
ISSUE DATE / TIME: 202006292155
ISSUE DATE / TIME: 202006300049
ISSUE DATE / TIME: 202006301511
Unit Type and Rh: 600
Unit Type and Rh: 6200
Unit Type and Rh: 9500
Unit Type and Rh: 9500

## 2019-04-01 LAB — HIV ANTIBODY (ROUTINE TESTING W REFLEX): HIV Screen 4th Generation wRfx: NONREACTIVE

## 2019-04-01 SURGERY — COLONOSCOPY
Anesthesia: General

## 2019-04-01 NOTE — Plan of Care (Signed)
  Problem: Education: Goal: Ability to identify signs and symptoms of gastrointestinal bleeding will improve Outcome: Progressing   Problem: Bowel/Gastric: Goal: Will show no signs and symptoms of gastrointestinal bleeding Outcome: Progressing   Problem: Fluid Volume: Goal: Will show no signs and symptoms of excessive bleeding Outcome: Progressing   Problem: Clinical Measurements: Goal: Complications related to the disease process, condition or treatment will be avoided or minimized Outcome: Progressing   Problem: Education: Goal: Knowledge of General Education information will improve Description: Including pain rating scale, medication(s)/side effects and non-pharmacologic comfort measures Outcome: Progressing   Problem: Health Behavior/Discharge Planning: Goal: Ability to manage health-related needs will improve Outcome: Progressing Note: Patient is now willing to drink his old Golytely prep from last night throughout the remainder of the shift. They will re-attempt his colonoscopy tomorrow AM after a better bowel prep. Patient has made significant progress. Wenda Low [facility]

## 2019-04-01 NOTE — Progress Notes (Signed)
[place] at [facility]                                                                                                                                                                                  Patient Demographics   Roberto Collins, is a 69 y.o. male, DOB - Apr 02, 1951, EXB:[id number]  Admit date - 03/30/2019   Admitting Physician Saundra Shelling, MD  Outpatient Primary MD for the patient is Patient, No Pcp Per   LOS - 2  Subjective: Patient is feeling better trying to drink contrast for his colonoscopy    Review of Systems:   CONSTITUTIONAL: No documented fever. No fatigue, weakness. No weight gain, no weight loss.  EYES: No blurry or double vision.  ENT: No tinnitus. No postnasal drip. No redness of the oropharynx.  RESPIRATORY: No cough, no wheeze, no hemoptysis. No dyspnea.  CARDIOVASCULAR: No chest pain. No orthopnea. No palpitations. No syncope.  GASTROINTESTINAL: No nausea, no vomiting or diarrhea. No abdominal pain. No melena or hematochezia.  GENITOURINARY: No dysuria or hematuria.  ENDOCRINE: No polyuria or nocturia. No heat or cold intolerance.  HEMATOLOGY: No anemia. No bruising. No bleeding.  INTEGUMENTARY: No rashes. No lesions.  MUSCULOSKELETAL: No arthritis. No swelling. No gout.  NEUROLOGIC: No numbness, tingling, or ataxia. No seizure-type activity.  PSYCHIATRIC: No anxiety. No insomnia. No ADD.    Vitals:   Vitals:   03/31/19 1824 03/31/19 2123 04/01/19 0413 04/01/19 0756  BP: 125/66 133/70 (!) 106/59 104/60  Pulse: 70 67 67 79  Resp:  18 16   Temp: 98.5 F (36.9 C) 98.6 F (37 C) 98.5 F (36.9 C) 98.5 F (36.9 C)  TempSrc: Oral Oral Oral Oral  SpO2:  100% 96% 95%  Weight:      Height:        Wt Readings from Last 3 Encounters:  03/30/19 52.2 kg     Intake/Output Summary (Last 24 hours) at 04/01/2019 1429 Last data filed at 04/01/2019 0120 Gross per 24 hour  Intake -  Output 600 ml  Net -600 ml    Physical  Exam:   GENERAL: Pleasant-appearing in no apparent distress.  HEAD, EYES, EARS, NOSE AND THROAT: Atraumatic, normocephalic. Extraocular muscles are intact. Pupils equal and reactive to light. Sclerae anicteric. No conjunctival injection. No oro-pharyngeal erythema.  NECK: Supple. There is no jugular venous distention. No bruits, no lymphadenopathy, no thyromegaly.  HEART: Regular rate and rhythm,. No murmurs, no rubs, no clicks.  LUNGS: Clear to auscultation bilaterally. No rales or rhonchi. No wheezes.  ABDOMEN: Soft, flat, nontender, nondistended. Has good bowel sounds. No hepatosplenomegaly appreciated.  EXTREMITIES: No evidence of any cyanosis, clubbing,  or peripheral edema.  +2 pedal and radial pulses bilaterally.  NEUROLOGIC: The patient is alert, awake, and oriented x3 with no focal motor or sensory deficits appreciated bilaterally.  SKIN: Moist and warm with no rashes appreciated.  Psych: Not anxious, depressed LN: No inguinal LN enlargement    Antibiotics   Anti-infectives (From admission, onward)   None      Medications   Scheduled Meds: . sodium chloride   Intravenous Once  . chlorproMAZINE  300 mg Oral QHS  . cholecalciferol  2,000 Units Oral Daily  . cyclobenzaprine  10 mg Oral QHS  . folic acid  1 mg Oral Daily  . gabapentin  1,200-1,800 mg Oral QHS  . morphine  60 mg Oral Q12H  . nicotine  21 mg Transdermal Daily  . pneumococcal 23 valent vaccine  0.5 mL Intramuscular Tomorrow-1000  . sodium chloride flush  3 mL Intravenous Q12H   Continuous Infusions: . sodium chloride 1,000 mL (03/31/19 1104)   PRN Meds:.sodium chloride, acetaminophen **OR** acetaminophen, ondansetron **OR** ondansetron (ZOFRAN) IV, oxyCODONE-acetaminophen, senna-docusate, sodium chloride flush   Data Review:   Micro Results Recent Results (from the past 240 hour(s))  Novel Coronavirus,NAA,(SEND-OUT TO REF LAB - TAT 24-48 hrs); Hosp Order     Status: None   Collection Time: 03/30/19   1:08 PM   Specimen: Nasopharyngeal Swab; Respiratory  Result Value Ref Range Status   SARS-CoV-2, NAA NOT DETECTED NOT DETECTED Final    Comment: (NOTE) This test was developed and its performance characteristics determined by [employer]. This test has not been FDA cleared or approved. This test has been authorized by FDA under an Emergency Use Authorization (EUA). This test is only authorized for the duration of time the declaration that circumstances exist justifying the authorization of the emergency use of in vitro diagnostic tests for detection of SARS-CoV-2 virus and/or diagnosis of COVID-19 infection under section 564(b)(1) of the Act, 21 U.S.C. 924QAS-3(M)(1), unless the authorization is terminated or revoked sooner. When diagnostic testing is negative, the possibility of a false negative result should be considered in the context of a patient's recent exposures and the presence of clinical signs and symptoms consistent with COVID-19. An individual without symptoms of COVID-19 and who is not shedding SARS-CoV-2 virus would expect to have a negative (not detected) result in this assay. Performed  At: [facility] [place], [place] [id number] [name] MD XQ:[id number]    [place]  Final    Comment: Performed at [facility], [address] [place],  [address]    Radiology Reports No results found.   CBC Recent Labs  Lab 03/30/19 1205 03/31/19 0624 04/01/19 0843  WBC 7.0 4.7 3.9*  HGB 3.5* 6.9* 8.3*  HCT 13.0* 21.7* 26.2*  PLT 270 163 155  MCV 67.0* 76.4* 77.1*  MCH 18.0* 24.3* 24.4*  MCHC 26.9* 31.8 31.7  RDW 16.3* 21.3* 20.9*  LYMPHSABS 0.5*  --  0.8  MONOABS 0.8  --  0.5  EOSABS 0.0  --  0.0  BASOSABS 0.0  --  0.0    Chemistries  Recent Labs  Lab 03/30/19 1205 03/31/19 0624  NA 134* 134*  K 3.7 3.9  CL 104 105  CO2 22 24  GLUCOSE 119* 94  BUN 12 16  CREATININE 1.07  0.87  CALCIUM 8.3* 8.0*  AST 19  --   ALT 17  --   ALKPHOS 68  --   BILITOT 0.3  --    ------------------------------------------------------------------------------------------------------------------  estimated creatinine clearance is 61.7 mL/min (by C-G formula based on SCr of 0.87 mg/dL). ------------------------------------------------------------------------------------------------------------------ No results for input(s): HGBA1C in the last 72 hours. ------------------------------------------------------------------------------------------------------------------ No results for input(s): CHOL, HDL, LDLCALC, TRIG, CHOLHDL, LDLDIRECT in the last 72 hours. ------------------------------------------------------------------------------------------------------------------ No results for input(s): TSH, T4TOTAL, T3FREE, THYROIDAB in the last 72 hours.  Invalid input(s): FREET3 ------------------------------------------------------------------------------------------------------------------ Recent Labs    03/31/19 0624  VITAMINB12 386  FOLATE 6.8  FERRITIN 10*  TIBC 383  IRON 343*  RETICCTPCT 1.3    Coagulation profile No results for input(s): INR, PROTIME in the last 168 hours.  No results for input(s): DDIMER in the last 72 hours.  Cardiac Enzymes No results for input(s): CKMB, TROPONINI, MYOGLOBIN in the last 168 hours.  Invalid input(s): CK ------------------------------------------------------------------------------------------------------------------ Invalid input(s): POCBNP    Assessment & Plan   IMPRESSION AND PLAN: 68 year old male patient with no significant past medical history presented to the emergency room for generalized weakness.  Patient has fatigue and tiredness.  Also feels dizzy.  -Symptomatic anemia EGD is negative for any acute source of GI blood loss Patient will have colonoscopy if negative will need hematology evaluation I have given patient  IV iron  -Dizziness and giddiness secondary to anemia  -DVT prophylaxis subcu Lovenox daily  -Tobacco abuse Tobacco cessation       Code Status Orders  (From admission, onward)         Start     Ordered   03/30/19 1604  Full code  Continuous     03/30/19 1603        Code Status History    This patient has a current code status but no historical code status.   Advance Care Planning Activity           Consults GI  DVT Prophylaxis SCDs Lab Results  Component Value Date   PLT 155 04/01/2019     Time Spent in minutes   35 minutes greater than 50% of time spent in care coordination and counseling patient regarding the condition and plan of care.   [name] M.D on 04/01/2019 at 2:29 PM  Between 7am to 6pm - Pager - [phone]  After 6pm go to [email or web address] - Proofreader  [employer]   Office  [phone]

## 2019-04-01 NOTE — Progress Notes (Signed)
[name], MD [address], [place], [place], [place], [address] [address], [place], [place], [place], [address] Phone: [phone]  Fax: [phone]   Subjective: No signs of active GI bleeding.  Hemoglobin improved   Objective: Exam: Vital signs in last 24 hours: Vitals:   03/31/19 2123 04/01/19 0413 04/01/19 0756 04/01/19 1611  BP: 133/70 (!) 106/59 104/60 118/68  Pulse: 67 67 79 75  Resp: 18 16    Temp: 98.6 F (37 C) 98.5 F (36.9 C) 98.5 F (36.9 C) 97.6 F (36.4 C)  TempSrc: Oral Oral Oral Oral  SpO2: 100% 96% 95% 100%  Weight:      Height:       Weight change:   Intake/Output Summary (Last 24 hours) at 04/01/2019 1718 Last data filed at 04/01/2019 0120 Gross per 24 hour  Intake -  Output 600 ml  Net -600 ml    General: No acute distress, AAO x3 Abd: Soft, NT/ND, No HSM Skin: Warm, no rashes Neck: Supple, Trachea midline   Lab Results: Lab Results  Component Value Date   WBC 3.9 (L) 04/01/2019   HGB 8.3 (L) 04/01/2019   HCT 26.2 (L) 04/01/2019   MCV 77.1 (L) 04/01/2019   PLT 155 04/01/2019   Micro Results: Recent Results (from the past 240 hour(s))  Novel Coronavirus,NAA,(SEND-OUT TO REF LAB - TAT 24-48 hrs); Hosp Order     Status: None   Collection Time: 03/30/19  1:08 PM   Specimen: Nasopharyngeal Swab; Respiratory  Result Value Ref Range Status   SARS-CoV-2, NAA NOT DETECTED NOT DETECTED Final    Comment: (NOTE) This test was developed and its performance characteristics determined by [employer]. This test has not been FDA cleared or approved. This test has been authorized by FDA under an Emergency Use Authorization (EUA). This test is only authorized for the duration of time the declaration that circumstances exist justifying the authorization of the emergency use of in vitro diagnostic tests for detection of SARS-CoV-2 virus and/or diagnosis of COVID-19 infection under section 564(b)(1) of the Act, 21 U.S.C.  614ERX-5(Q)(0), unless the authorization is terminated or revoked sooner. When diagnostic testing is negative, the possibility of a false negative result should be considered in the context of a patient's recent exposures and the presence of clinical signs and symptoms consistent with COVID-19. An individual without symptoms of COVID-19 and who is not shedding SARS-CoV-2 virus would expect to have a negative (not detected) result in this assay. Performed  At: [facility] [place], [place] [id number] [name] MD DT:[id number]    [place]  Final    Comment: Performed at [facility], [address] [place], [place] [address]   Studies/Results: No results found. Medications:  Scheduled Meds: . sodium chloride   Intravenous Once  . chlorproMAZINE  300 mg Oral QHS  . cholecalciferol  2,000 Units Oral Daily  . cyclobenzaprine  10 mg Oral QHS  . folic acid  1 mg Oral Daily  . gabapentin  1,200-1,800 mg Oral QHS  . morphine  60 mg Oral Q12H  . nicotine  21 mg Transdermal Daily  . pneumococcal 23 valent vaccine  0.5 mL Intramuscular Tomorrow-1000  . sodium chloride flush  3 mL Intravenous Q12H   Continuous Infusions: . sodium chloride 1,000 mL (03/31/19 1104)   PRN Meds:.sodium chloride, acetaminophen **OR** acetaminophen, ondansetron **OR** ondansetron (ZOFRAN) IV, oxyCODONE-acetaminophen, senna-docusate, sodium chloride flush   Assessment: Active Problems:   Anemia  Plan: Patient only drank 1 cup of his prep yesterday.  Therefore procedure was canceled today and has been rescheduled to tomorrow  Importance of completing his prep today explained to patient and he verbalized understanding  I have discussed alternative options, risks & benefits,  which include, but are not limited to, bleeding, infection, perforation,respiratory complication & drug reaction.  The patient agrees with this plan & written consent  will be obtained.      LOS: 2 days   [name], MD 04/01/2019, 5:18 PM

## 2019-04-01 NOTE — TOC Progression Note (Signed)
Transition of Care [facility]) - Progression Note    Patient Details  Name: Roberto Collins MRN: [id number] Date of Birth: 05/23/1951  [facility]) CM/SW Contact  Katrina Stack, RN Phone Number: 04/01/2019, 3:13 PM  Clinical Narrative:   Appoint made for July 20 at [facility] to get established with PCP.  Physical therapy has evaluated and no follow up is needed.    Expected Discharge Plan: [place] Barriers to Discharge: Continued Medical Work up  Expected Discharge Plan and Services Expected Discharge Plan: [place]   Discharge Planning Services: CM Consult   Living arrangements for the past 2 months: Single Family Home                                       Social Determinants of Health (SDOH) Interventions    Readmission Risk Interventions No flowsheet data found.

## 2019-04-01 NOTE — TOC Progression Note (Addendum)
Transition of Care [facility]) - Progression Note    Patient Details  Name: Roberto Collins MRN: [id number] Date of Birth: [birth year]  Transition of Care [facility]) CM/SW Contact  Katrina Stack, RN Phone Number: 04/01/2019, 2:10 PM  Clinical Narrative:   Patient had endoscopy 6/30 and for colonoscopy today.  Reported patient had poor bowel prep. prep. Awaiting report regarding whether it will have to be rescheduled. Hemoglobin improved. PT consult pending. Will send FL2 if SNF is recommended.  Informed during progression that patient's home is being fumigated today.  Have asked unit secretary to make initial appointment for patient with [facility] or [facility].    Expected Discharge Plan: [place] Barriers to Discharge: Continued Medical Work up  Expected Discharge Plan and Services Expected Discharge Plan: [place]   Discharge Planning Services: CM Consult   Living arrangements for the past 2 months: Single Family Home                                       Social Determinants of Health (SDOH) Interventions    Readmission Risk Interventions No flowsheet data found.

## 2019-04-01 NOTE — Progress Notes (Signed)
Patient has no acute event overnight..Remained hemodynamically stable. Patient was educated about Golytely drink.

## 2019-04-01 NOTE — Evaluation (Signed)
Physical Therapy Evaluation Patient Details Name: Roberto Collins MRN: [id number] DOB: 21-Nov-1950 Today's Date: 04/01/2019   History of Present Illness  Pt is a 68 y.o. male presenting to hospital 03/30/19 with generalized weaknes, fatigue, and dizzy x2-3 months.  Pt admitted with symptomatic anemia (Hgb 3.5) and s/p multiple blood transfusions.  Pt s/p upper GI endoscopy 03/31/19.  PMH includes back surgery (h/o chronic back problems).  Clinical Impression  Prior to hospital admission, pt was independent but limited with mobility recently d/t weakness and fatigue.  Pt lives alone in 1 level home.  Currently pt is independent with supine to sit bed mobility; independent with transfers; and independent with ambulation 280 feet (no AD).  Overall pt appearing strong and steady without any loss of balance during session's activities.  No acute PT needs identified.  Will discharge pt from PT in house and complete current PT order.  Please re-consult PT if pt's status changes and acute PT needs are identified.    Follow Up Recommendations No PT follow up    Equipment Recommendations  None recommended by PT    Recommendations for Other Services       Precautions / Restrictions Precautions Precautions: Fall Restrictions Weight Bearing Restrictions: No      Mobility  Bed Mobility Overal bed mobility: Independent             General bed mobility comments: Supine to sit without any noted difficulties.  Transfers Overall transfer level: Independent Equipment used: None             General transfer comment: Sit to stand (x1 trial bed and x1 trial recliner) and stand step turn bed to recliner without any noted difficulties; strong stand and controlled descent sitting noted  Ambulation/Gait Ambulation/Gait assistance: Independent Gait Distance (Feet): 280 Feet Assistive device: None Gait Pattern/deviations: WFL(Within Functional Limits)   Gait velocity interpretation: >2.62 ft/sec,  indicative of community ambulatory General Gait Details: steady ambulation noted  Stairs            Wheelchair Mobility    Modified Rankin (Stroke Patients Only)       Balance Overall balance assessment: No apparent balance deficits (not formally assessed)(No loss of balance with ambulation and turning)                                           Pertinent Vitals/Pain Pain Assessment: No/denies pain  Vitals (HR and O2 on room air) stable and WFL throughout treatment session.    Home Living Family/patient expects to be discharged to:: Private residence Living Arrangements: Alone   Type of Home: House Home Access: Stairs to enter Entrance Stairs-Rails: Left Entrance Stairs-Number of Steps: 2 Home Layout: One level Home Equipment: None      Prior Function Level of Independence: Independent         Comments: Pt reports 1 fall about 6 weeks ago d/t weakness (but no injuries)     Hand Dominance        Extremity/Trunk Assessment   Upper Extremity Assessment Upper Extremity Assessment: Overall WFL for tasks assessed    Lower Extremity Assessment Lower Extremity Assessment: Overall WFL for tasks assessed    Cervical / Trunk Assessment Cervical / Trunk Assessment: Normal  Communication   Communication: No difficulties  Cognition Arousal/Alertness: Awake/alert Behavior During Therapy: WFL for tasks assessed/performed Overall Cognitive Status: Within Functional Limits for tasks assessed  General Comments   Nursing cleared pt for participation in physical therapy.  Pt agreeable to PT session.    Exercises     Assessment/Plan    PT Assessment Patent does not need any further PT services  PT Problem List         PT Treatment Interventions      PT Goals (Current goals can be found in the Care Plan section)  Acute Rehab PT Goals Patient Stated Goal: to go home PT Goal  Formulation: With patient Time For Goal Achievement: 04/15/19 Potential to Achieve Goals: Good    Frequency     Barriers to discharge        Co-evaluation               AM-PAC PT "6 Clicks" Mobility  Outcome Measure Help needed turning from your back to your side while in a flat bed without using bedrails?: None Help needed moving from lying on your back to sitting on the side of a flat bed without using bedrails?: None Help needed moving to and from a bed to a chair (including a wheelchair)?: None Help needed standing up from a chair using your arms (e.g., wheelchair or bedside chair)?: None Help needed to walk in hospital room?: None Help needed climbing 3-5 steps with a railing? : None 6 Click Score: 24    End of Session Equipment Utilized During Treatment: Gait belt Activity Tolerance: Patient tolerated treatment well Patient left: in chair;with call bell/phone within reach;with chair alarm set Nurse Communication: Mobility status;Precautions PT Visit Diagnosis: Muscle weakness (generalized) (M62.81)    Time: 1424-1440 PT Time Calculation (min) (ACUTE ONLY): 16 min   Charges:   PT Evaluation $PT Eval Low Complexity: 1 Low         Yajahira Tison, PT 04/01/19, 3:55 PM [phone]

## 2019-04-02 ENCOUNTER — Inpatient Hospital Stay: Payer: Medicare Other | Admitting: Anesthesiology

## 2019-04-02 ENCOUNTER — Encounter: Payer: Self-pay | Admitting: Gastroenterology

## 2019-04-02 ENCOUNTER — Inpatient Hospital Stay: Payer: Medicare Other

## 2019-04-02 ENCOUNTER — Encounter
Admission: EM | Disposition: A | Discharge status: Home / Self Care | Payer: Self-pay | Source: Home / Self Care | Attending: Internal Medicine

## 2019-04-02 DIAGNOSIS — K6289 Other specified diseases of anus and rectum: Secondary | ICD-10-CM

## 2019-04-02 DIAGNOSIS — K635 Polyp of colon: Secondary | ICD-10-CM

## 2019-04-02 DIAGNOSIS — D490 Neoplasm of unspecified behavior of digestive system: Secondary | ICD-10-CM

## 2019-04-02 DIAGNOSIS — K5730 Diverticulosis of large intestine without perforation or abscess without bleeding: Secondary | ICD-10-CM

## 2019-04-02 HISTORY — PX: GIVENS CAPSULE STUDY: SHX5432

## 2019-04-02 HISTORY — PX: COLONOSCOPY WITH PROPOFOL: SHX5780

## 2019-04-02 LAB — CBC
HCT: 25.9 % — ABNORMAL LOW (ref 39.0–52.0)
Hemoglobin: 8.1 g/dL — ABNORMAL LOW (ref 13.0–17.0)
MCH: 24.5 pg — ABNORMAL LOW (ref 26.0–34.0)
MCHC: 31.3 g/dL (ref 30.0–36.0)
MCV: 78.5 fL — ABNORMAL LOW (ref 80.0–100.0)
Platelets: 161 10*3/uL (ref 150–400)
RBC: 3.3 MIL/uL — ABNORMAL LOW (ref 4.22–5.81)
RDW: 22.3 % — ABNORMAL HIGH (ref 11.5–15.5)
WBC: 3.7 10*3/uL — ABNORMAL LOW (ref 4.0–10.5)
nRBC: 0 % (ref 0.0–0.2)

## 2019-04-02 SURGERY — COLONOSCOPY WITH PROPOFOL
Anesthesia: General

## 2019-04-02 MED ORDER — PROPOFOL 500 MG/50ML IV EMUL
INTRAVENOUS | Status: AC
  Filled 2019-04-02: qty 50

## 2019-04-02 MED ORDER — PROPOFOL 500 MG/50ML IV EMUL
INTRAVENOUS | Status: DC | PRN
  Administered 2019-04-02: 100 ug/kg/min via INTRAVENOUS

## 2019-04-02 MED ORDER — IOHEXOL 240 MG/ML SOLN: 25.0000 mL | INTRAMUSCULAR | Status: AC

## 2019-04-02 MED ORDER — MIDAZOLAM HCL 2 MG/2ML IJ SOLN
INTRAMUSCULAR | Status: AC
  Filled 2019-04-02: qty 2

## 2019-04-02 MED ORDER — MIDAZOLAM HCL 2 MG/2ML IJ SOLN
INTRAMUSCULAR | Status: DC | PRN
  Administered 2019-04-02: 1 mg via INTRAVENOUS

## 2019-04-02 MED ORDER — ENSURE ENLIVE PO LIQD: 237.0000 mL | Freq: Two times a day (BID) | ORAL | Status: DC

## 2019-04-02 MED ORDER — FENTANYL CITRATE (PF) 100 MCG/2ML IJ SOLN
INTRAMUSCULAR | Status: DC | PRN
  Administered 2019-04-02: 50 ug via INTRAVENOUS

## 2019-04-02 MED ORDER — FENTANYL CITRATE (PF) 100 MCG/2ML IJ SOLN
INTRAMUSCULAR | Status: AC
  Filled 2019-04-02: qty 2

## 2019-04-02 MED ORDER — ADULT MULTIVITAMIN W/MINERALS CH
1.0000 | ORAL_TABLET | Freq: Every day | ORAL | Status: DC
  Administered 2019-04-03: 1 via ORAL
  Filled 2019-04-02: qty 1

## 2019-04-02 MED ORDER — IOHEXOL 300 MG/ML  SOLN
75.0000 mL | Freq: Once | INTRAMUSCULAR | Status: AC | PRN
  Administered 2019-04-02: 75 mL via INTRAVENOUS

## 2019-04-02 NOTE — Op Note (Signed)
[facility] Patient Name: Roberto Collins Procedure Date: 04/02/2019 9:57 AM MRN: [id number] Account #: [id number] Date of Birth: 1951/05/29 Admit Type: Inpatient Age: 68 Room: [facility] ENDO ROOM 1 Gender: Male Note Status: Finalized Procedure:            Colonoscopy Indications:          Iron deficiency anemia Providers:            Aurel Nguyen B. Bonna Gains MD, MD Referring MD:         Forest Gleason Md, MD (Referring MD) Medicines:            Monitored Anesthesia Care Complications:        No immediate complications. Procedure:            Pre-Anesthesia Assessment:                       - ASA Grade Assessment: III - A patient with severe                        systemic disease.                       - Prior to the procedure, a History and Physical was                        performed, and patient medications, allergies and                        sensitivities were reviewed. The patient's tolerance of                        previous anesthesia was reviewed.                       - The risks and benefits of the procedure and the                        sedation options and risks were discussed with the                        patient. All questions were answered and informed                        consent was obtained.                       - Patient identification and proposed procedure were                        verified prior to the procedure by the physician, the                        nurse, the anesthesiologist, the anesthetist and the                        technician. The procedure was verified in the procedure                        room.                       After  obtaining informed consent, the colonoscope was                        passed under direct vision. Throughout the procedure,                        the patient's blood pressure, pulse, and oxygen                        saturations were monitored continuously. The   Colonoscope was introduced through the anus and                        advanced to the the cecum, identified by appendiceal                        orifice and ileocecal valve. The colonoscopy was                        performed with ease. The patient tolerated the                        procedure well. The quality of the bowel preparation                        was fair. Findings:      The perianal and digital rectal examinations were normal.      5-6 sessile polyps were found in the sigmoid colon, transverse colon and       ascending colon. The polyps were 4 to 7 mm in size. These were not       removed due to patient's sever anemia on this admission.      Multiple diverticula were found in the sigmoid colon and ascending colon.      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       The mass was partially circumferential (involving one-half of the lumen       circumference). No bleeding was present. This was biopsied with a cold       forceps for histology.      The exam was otherwise without abnormality.      Mass also noted on retroflexion      No additional abnormalities were found on retroflexion. Impression:           - Preparation of the colon was fair.                       - 5-6, 4 to 7 mm polyps in the sigmoid colon, in the                        transverse colon and in the ascending colon.                       - Diverticulosis in the sigmoid colon and in the                        ascending colon.                       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                       -  The examination was otherwise normal. Recommendation:       - Refer to a surgeon today.                       - High fiber diet.                       - Advance diet as tolerated.                       - Continue present medications.                       - Await pathology results.                       - Repeat colonoscopy in 3-6 months to remove polyps                         seen after workup for rectal mass is compelte.                       - The findings and recommendations were discussed with                        the patient.                       - Return to primary care physician as previously                        scheduled.                       - Return to my office in 2 weeks. Procedure Code(s):    --- Professional ---                       [phone], Colonoscopy, flexible; with biopsy, single or                        multiple Diagnosis Code(s):    --- Professional ---                       K63.5, Polyp of colon                       D49.0, Neoplasm of unspecified behavior of digestive                        system                       D50.9, Iron deficiency anemia, unspecified                       K57.30, Diverticulosis of large intestine without                        perforation or abscess without bleeding CPT copyright 2019 [employer] Medical [employer]. All rights reserved. The codes documented in this report are preliminary and upon coder review may  be revised to meet current compliance requirements.  [name], MD Margretta Sidle B. Kalonji Zurawski MD, MD 04/02/2019 10:45:24 AM This report  has been signed electronically. Number of Addenda: 0 Note Initiated On: 04/02/2019 9:57 AM Scope Withdrawal Time: 0 hours 14 minutes 48 seconds  Total Procedure Duration: 0 hours 20 minutes 8 seconds  Estimated Blood Loss: Estimated blood loss: none.      [facility]

## 2019-04-02 NOTE — Anesthesia Procedure Notes (Signed)
Performed by: Cook-Martin, Thad Osoria Pre-anesthesia Checklist: Patient identified, Emergency Drugs available, Suction available, Patient being monitored and Timeout performed Patient Re-evaluated:Patient Re-evaluated prior to induction Oxygen Delivery Method: Nasal cannula Preoxygenation: Pre-oxygenation with 100% oxygen Induction Type: IV induction Placement Confirmation: positive ETCO2 and CO2 detector       

## 2019-04-02 NOTE — Anesthesia Post-op Follow-up Note (Signed)
Anesthesia QCDR form completed.        

## 2019-04-02 NOTE — Transfer of Care (Signed)
Immediate Anesthesia Transfer of Care Note  Patient: Roberto Collins  Procedure(s) Performed: COLONOSCOPY WITH PROPOFOL (N/A ) GIVENS CAPSULE STUDY (N/A )  Patient Location: PACU  Anesthesia Type:General  Level of Consciousness: alert  and sedated  Airway & Oxygen Therapy: Patient Spontanous Breathing and Patient connected to nasal cannula oxygen  Post-op Assessment: Report given to RN and Post -op Vital signs reviewed and stable  Post vital signs: Reviewed and stable  Last Vitals:  Vitals Value Taken Time  BP    Temp    Pulse    Resp    SpO2      Last Pain:  Vitals:   04/02/19 0850  TempSrc: Tympanic  PainSc: 0-No pain         Complications: No apparent anesthesia complications

## 2019-04-02 NOTE — Progress Notes (Signed)
[place] at [facility]                                                                                                                                                                                  Patient Demographics   Roberto Collins, is a 68 y.o. male, DOB - 07-17-1951, CXK:[id number]  Admit date - 03/30/2019   Admitting Physician Saundra Shelling, MD  Outpatient Primary MD for the patient is Patient, No Pcp Per   LOS - 3  Subjective: Patient had his colonoscopy which shows a tumor in the rectum which is biopsied    Review of Systems:   CONSTITUTIONAL: No documented fever. No fatigue, weakness. No weight gain, no weight loss.  EYES: No blurry or double vision.  ENT: No tinnitus. No postnasal drip. No redness of the oropharynx.  RESPIRATORY: No cough, no wheeze, no hemoptysis. No dyspnea.  CARDIOVASCULAR: No chest pain. No orthopnea. No palpitations. No syncope.  GASTROINTESTINAL: No nausea, no vomiting or diarrhea. No abdominal pain. No melena or hematochezia.  GENITOURINARY: No dysuria or hematuria.  ENDOCRINE: No polyuria or nocturia. No heat or cold intolerance.  HEMATOLOGY: No anemia. No bruising. No bleeding.  INTEGUMENTARY: No rashes. No lesions.  MUSCULOSKELETAL: No arthritis. No swelling. No gout.  NEUROLOGIC: No numbness, tingling, or ataxia. No seizure-type activity.  PSYCHIATRIC: No anxiety. No insomnia. No ADD.    Vitals:   Vitals:   04/02/19 1030 04/02/19 1040 04/02/19 1050 04/02/19 1100  BP: (!) 92/56 106/61 106/61 113/65  Pulse: (!) 56 (!) 56 (!) 54 60  Resp: 10 10 (!) 9 12  Temp: (!) 96.8 F (36 C)     TempSrc: Tympanic     SpO2: 100% 100% 100% 100%  Weight:      Height:        Wt Readings from Last 3 Encounters:  04/02/19 52.9 kg     Intake/Output Summary (Last 24 hours) at 04/02/2019 1426 Last data filed at 04/02/2019 1033 Gross per 24 hour  Intake 1180 ml  Output 150 ml  Net 1030 ml    Physical Exam:   GENERAL:  Pleasant-appearing in no apparent distress.  HEAD, EYES, EARS, NOSE AND THROAT: Atraumatic, normocephalic. Extraocular muscles are intact. Pupils equal and reactive to light. Sclerae anicteric. No conjunctival injection. No oro-pharyngeal erythema.  NECK: Supple. There is no jugular venous distention. No bruits, no lymphadenopathy, no thyromegaly.  HEART: Regular rate and rhythm,. No murmurs, no rubs, no clicks.  LUNGS: Clear to auscultation bilaterally. No rales or rhonchi. No wheezes.  ABDOMEN: Soft, flat, nontender, nondistended. Has good bowel sounds. No hepatosplenomegaly appreciated.  EXTREMITIES: No evidence of any cyanosis, clubbing,  or peripheral edema.  +2 pedal and radial pulses bilaterally.  NEUROLOGIC: The patient is alert, awake, and oriented x3 with no focal motor or sensory deficits appreciated bilaterally.  SKIN: Moist and warm with no rashes appreciated.  Psych: Not anxious, depressed LN: No inguinal LN enlargement    Antibiotics   Anti-infectives (From admission, onward)   None      Medications   Scheduled Meds: . chlorproMAZINE  300 mg Oral QHS  . cholecalciferol  2,000 Units Oral Daily  . cyclobenzaprine  10 mg Oral QHS  . feeding supplement (ENSURE ENLIVE)  237 mL Oral BID BM  . folic acid  1 mg Oral Daily  . gabapentin  1,200-1,800 mg Oral QHS  . iohexol  25 mL Oral Q1 Hr x 2  . morphine  60 mg Oral Q12H  . [START ON 04/03/2019] multivitamin with minerals  1 tablet Oral Daily  . nicotine  21 mg Transdermal Daily  . pneumococcal 23 valent vaccine  0.5 mL Intramuscular Tomorrow-1000  . sodium chloride flush  3 mL Intravenous Q12H   Continuous Infusions: . sodium chloride 1,000 mL (03/31/19 1104)   PRN Meds:.sodium chloride, acetaminophen **OR** acetaminophen, ondansetron **OR** ondansetron (ZOFRAN) IV, oxyCODONE-acetaminophen, senna-docusate, sodium chloride flush   Data Review:   Micro Results Recent Results (from the past 240 hour(s))  Novel  Coronavirus,NAA,(SEND-OUT TO REF LAB - TAT 24-48 hrs); Hosp Order     Status: None   Collection Time: 03/30/19  1:08 PM   Specimen: Nasopharyngeal Swab; Respiratory  Result Value Ref Range Status   SARS-CoV-2, NAA NOT DETECTED NOT DETECTED Final    Comment: (NOTE) This test was developed and its performance characteristics determined by [employer]. This test has not been FDA cleared or approved. This test has been authorized by FDA under an Emergency Use Authorization (EUA). This test is only authorized for the duration of time the declaration that circumstances exist justifying the authorization of the emergency use of in vitro diagnostic tests for detection of SARS-CoV-2 virus and/or diagnosis of COVID-19 infection under section 564(b)(1) of the Act, 21 U.S.C. 433IRJ-1(O)(8), unless the authorization is terminated or revoked sooner. When diagnostic testing is negative, the possibility of a false negative result should be considered in the context of a patient's recent exposures and the presence of clinical signs and symptoms consistent with COVID-19. An individual without symptoms of COVID-19 and who is not shedding SARS-CoV-2 virus would expect to have a negative (not detected) result in this assay. Performed  At: [facility] [place], [place] [id number] [name] MD SW:[id number]    [place]  Final    Comment: Performed at [facility], [address], [place], [place] [address]    Radiology Reports No results found.   CBC Recent Labs  Lab 03/30/19 1205 03/31/19 0624 04/01/19 0843 04/02/19 0552  WBC 7.0 4.7 3.9* 3.7*  HGB 3.5* 6.9* 8.3* 8.1*  HCT 13.0* 21.7* 26.2* 25.9*  PLT 270 163 155 161  MCV 67.0* 76.4* 77.1* 78.5*  MCH 18.0* 24.3* 24.4* 24.5*  MCHC 26.9* 31.8 31.7 31.3  RDW 16.3* 21.3* 20.9* 22.3*  LYMPHSABS 0.5*  --  0.8  --   MONOABS 0.8  --  0.5  --   EOSABS 0.0  --  0.0  --    BASOSABS 0.0  --  0.0  --     Chemistries  Recent Labs  Lab 03/30/19 1205 03/31/19 0624  NA 134* 134*  K 3.7  3.9  CL 104 105  CO2 22 24  GLUCOSE 119* 94  BUN 12 16  CREATININE 1.07 0.87  CALCIUM 8.3* 8.0*  AST 19  --   ALT 17  --   ALKPHOS 68  --   BILITOT 0.3  --    ------------------------------------------------------------------------------------------------------------------ estimated creatinine clearance is 62.5 mL/min (by C-G formula based on SCr of 0.87 mg/dL). ------------------------------------------------------------------------------------------------------------------ No results for input(s): HGBA1C in the last 72 hours. ------------------------------------------------------------------------------------------------------------------ No results for input(s): CHOL, HDL, LDLCALC, TRIG, CHOLHDL, LDLDIRECT in the last 72 hours. ------------------------------------------------------------------------------------------------------------------ No results for input(s): TSH, T4TOTAL, T3FREE, THYROIDAB in the last 72 hours.  Invalid input(s): FREET3 ------------------------------------------------------------------------------------------------------------------ Recent Labs    03/31/19 0624  VITAMINB12 386  FOLATE 6.8  FERRITIN 10*  TIBC 383  IRON 343*  RETICCTPCT 1.3    Coagulation profile No results for input(s): INR, PROTIME in the last 168 hours.  No results for input(s): DDIMER in the last 72 hours.  Cardiac Enzymes No results for input(s): CKMB, TROPONINI, MYOGLOBIN in the last 168 hours.  Invalid input(s): CK ------------------------------------------------------------------------------------------------------------------ Invalid input(s): POCBNP    Assessment & Plan   IMPRESSION AND PLAN: 68 year old male patient with no significant past medical history presented to the emergency room for generalized weakness.  Patient has fatigue and  tiredness.  Also feels dizzy.  -Symptomatic anemia is due to chronic lower GI bleed EGD is negative for any acute source of GI blood loss Patient with rectal tumor GI has recommended surgery I will asked them to see I spoke to surgery they recommend a CT of the abdomen  -Dizziness and giddiness secondary to anemia now improved  -DVT prophylaxis subcu Lovenox daily  -Tobacco abuse Tobacco cessation       Code Status Orders  (From admission, onward)         Start     Ordered   03/30/19 1604  Full code  Continuous     03/30/19 1603        Code Status History    This patient has a current code status but no historical code status.   Advance Care Planning Activity           Consults GI  DVT Prophylaxis SCDs Lab Results  Component Value Date   PLT 161 04/02/2019     Time Spent in minutes   35 minutes greater than 50% of time spent in care coordination and counseling patient regarding the condition and plan of care.   [name] M.D on 04/02/2019 at 2:26 PM  Between 7am to 6pm - Pager - [phone]  After 6pm go to [email or web address] - Proofreader  [employer]   Office  [phone]

## 2019-04-02 NOTE — Care Management Important Message (Signed)
Important Message  Patient Details  Name: Roberto Collins MRN: [id number] Date of Birth: Dec 27, 1950   Medicare Important Message Given:  Yes     Dannette Barbara 04/02/2019, 1:25 PM

## 2019-04-02 NOTE — Consult Note (Addendum)
[facility] SURGICAL CONSULTATION NOTE (initial) - cpt: 92330   HISTORY OF PRESENT ILLNESS (HPI):  68 y.o. male presented to [facility] ED on 06/29 for evaluation of weakness. Patient reported a 2-3 month history of weakness and dizziness that was progressively worsening. Ambulating made these symptoms worse. He endorsed associated SOB with these symptoms. He denied any fever, chills, nausea, emesis, CP, abdominal pain, or bloody stools. He was worked up in the ED and was guaiac negative although his hemoglobin was profoundly low at 3.5. He was transfused in the ED and has received 5 units total which he has responded accordingly. He was evaluated by GI and underwent endoscopy on 06/30 with Dr Bonna Gains, MD which was unremarkable. He underwent colonoscopy today (07/02) which was concerning for rectal mass (biopsied) as well as multiple polyps throughout the colon (not biopsied) and general surgery consultation was recommended. Patient denied any history of colon CA in his family. No prior colonoscopy before today. No previous abdominal surgeries.   Surgery is consulted by hospitalist physician Dr. Posey Pronto, MD in this context for evaluation and management of rectal mass.   PAST MEDICAL HISTORY ([facility]):  History reviewed. No pertinent past medical history.   PAST SURGICAL HISTORY ([place]):  Past Surgical History:  Procedure Laterality Date  . BACK SURGERY    . ESOPHAGOGASTRODUODENOSCOPY Left 03/31/2019   Procedure: ESOPHAGOGASTRODUODENOSCOPY (EGD);  Surgeon: Virgel Manifold, MD;  Location[facility] ENDOSCOPY;  Service: Endoscopy;  Laterality: Left;     MEDICATIONS:  Prior to Admission medications   Medication Sig Start Date End Date Taking? Authorizing Provider  chlorproMAZINE (THORAZINE) 100 MG tablet Take 300 mg by mouth Nightly.   Yes [provider]  Cholecalciferol (VITAMIN D-1000 MAX ST) 25 MCG (1000 UT) tablet Take 2,000 Units by mouth daily.   Yes [provider]   cyclobenzaprine (FLEXERIL) 10 MG tablet Take 10 mg by mouth at bedtime.   Yes [provider]  gabapentin (NEURONTIN) 600 MG tablet Take 1,200-1,800 mg by mouth at bedtime.   Yes [provider]  morphine (MS CONTIN) 60 MG 12 hr tablet Take 60 mg by mouth every 12 (twelve) hours.   Yes [provider]  oxyCODONE-acetaminophen (PERCOCET/ROXICET) 5-325 MG tablet Take 1 tablet by mouth 2 (two) times daily as needed. 06/27/09  Yes [provider]     ALLERGIES:  Allergies  Allergen Reactions  . Bee Venom Anaphylaxis  . Penicillins Swelling     SOCIAL HISTORY:  Social History   Socioeconomic History  . Marital status: Single    Spouse name: Not on file  . Number of children: Not on file  . Years of education: Not on file  . Highest education level: Not on file  Occupational History  . Occupation: disabled  Social Needs  . Financial resource strain: Not very hard  . Food insecurity    Worry: Never true    Inability: Never true  . Transportation needs    Medical: No    Non-medical: No  Tobacco Use  . Smoking status: Current Some Day Smoker    Packs/day: 0.50    Types: Cigarettes  . Smokeless tobacco: Never Used  Substance and Sexual Activity  . Alcohol use: Never    Frequency: Never  . Drug use: Never  . Sexual activity: Not on file  Lifestyle  . Physical activity    Days per week: 0 days    Minutes per session: 0 min  . Stress: Very much  Relationships  .  Social Herbalist on phone: Once a week    Gets together: Once a week    Attends religious service: Never    Active member of club or organization: No    Attends meetings of clubs or organizations: Never    Relationship status: Not on file  . Intimate partner violence    Fear of current or ex partner: No    Emotionally abused: No    Physically abused: No    Forced sexual activity: No  Other Topics Concern  . Not on file  Social History Narrative  . Not on file      FAMILY HISTORY:  History reviewed. No pertinent family history.    REVIEW OF SYSTEMS:  Review of Systems  Constitutional: Negative for chills, fever and weight loss.  HENT: Negative for congestion and sore throat.   Respiratory: Negative for cough and shortness of breath.   Cardiovascular: Negative for chest pain and palpitations.  Gastrointestinal: Negative for abdominal pain, diarrhea, nausea and vomiting.  Genitourinary: Negative for dysuria and urgency.  Neurological: Positive for dizziness and weakness.  All other systems reviewed and are negative.   VITAL SIGNS:  Temp:  [96.8 F (36 C)-98.4 F (36.9 C)] 96.8 F (36 C) (07/02 1030) Pulse Rate:  [54-75] 60 (07/02 1100) Resp:  [9-20] 12 (07/02 1100) BP: (92-118)/(56-76) 113/65 (07/02 1100) SpO2:  [96 %-100 %] 100 % (07/02 1100) Weight:  [52.9 kg] 52.9 kg (07/02 0347)     Height: 5\' 9"  (175.3 cm) Weight: 52.9 kg BMI (Calculated): 17.21   INTAKE/OUTPUT:  07/01 0701 - 07/02 0700 In: 67 [P.O.:780] Out: 150 [Urine:150]  PHYSICAL EXAM:  Physical Exam Vitals signs and nursing note reviewed.  Constitutional:      General: He is not in acute distress.    Appearance: He is not ill-appearing.  HENT:     Head: Normocephalic and atraumatic.  Eyes:     General: No scleral icterus.    Conjunctiva/sclera: Conjunctivae normal.  Cardiovascular:     Rate and Rhythm: Normal rate and regular rhythm.  Pulmonary:     Effort: Pulmonary effort is normal. No respiratory distress.     Breath sounds: No wheezing.  Abdominal:     General: Abdomen is flat. There is no distension.     Tenderness: There is no abdominal tenderness. There is no guarding or rebound.  Musculoskeletal: Normal range of motion.     Right lower leg: No edema.     Left lower leg: No edema.  Skin:    General: Skin is warm and dry.     Findings: No erythema.  Neurological:     General: No focal deficit present.     Mental Status: He is alert. He is  disoriented.  Psychiatric:        Mood and Affect: Mood normal.        Behavior: Behavior normal.      Labs:  CBC Latest Ref Rng & Units 04/02/2019 04/01/2019 03/31/2019  WBC 4.0 - 10.5 K/uL 3.7(L) 3.9(L) 4.7  Hemoglobin 13.0 - 17.0 g/dL 8.1(L) 8.3(L) 6.9(L)  Hematocrit 39.0 - 52.0 % 25.9(L) 26.2(L) 21.7(L)  Platelets 150 - 400 K/uL 161 155 163   CMP Latest Ref Rng & Units 03/31/2019 03/30/2019  Glucose 70 - 99 mg/dL 94 119(H)  BUN 8 - 23 mg/dL 16 12  Creatinine 0.61 - 1.24 mg/dL 0.87 1.07  Sodium 135 - 145 mmol/L 134(L) 134(L)  Potassium 3.5 - 5.1 mmol/L 3.9  3.7  Chloride 98 - 111 mmol/L 105 104  CO2 22 - 32 mmol/L 24 22  Calcium 8.9 - 10.3 mg/dL 8.0(L) 8.3(L)  Total Protein 6.5 - 8.1 g/dL - 6.9  Total Bilirubin 0.3 - 1.2 mg/dL - 0.3  Alkaline Phos 38 - 126 U/L - 68  AST 15 - 41 U/L - 19  ALT 0 - 44 U/L - 17     Imaging studies:   CT Chest/Abdomen/Pelvis for staging pending   Assessment/Plan: (ICD-10's: K77.89) 68 y.o. male who presented with weakness and dizziness who was found to be profoundly anemic most likely attributable to rectal mass concerning for malignancy on colonoscopy today.   - Due to location of rectal mass, would benefit from surgical management with colorectal specialities. Patient will need referral to colorectal surgeon as this is not available at this facility.   - Continue staging work up; CEA; CT Chest/Abdomen/Pelvis  - Pain control prn  - monitor abdominal examination  - Monitor hgb; transfuse as needed  - further management per primary service  All of the above findings and recommendations were discussed with the patient and the medical team, and all of patient's questions were answered to his expressed satisfaction.  Thank you for the opportunity to participate in this patient's care. We will sign off for now.  -- Edison Simon, PA-C [facility] 04/02/2019, 2:05 PM [phone] M-F: 7am - 4pm  I saw and evaluated the  patient.  I agree with the above documentation, exam, and plan, which I have edited where appropriate. In light of the low location of the lesion, a colon and rectal surgeon is recommended, pending biopsy results and staging studies. He also has multiple polyps throughout the colon and may require further investigation/biopsies/genetics counseling.   Fredirick Maudlin  2:53 PM

## 2019-04-02 NOTE — Anesthesia Postprocedure Evaluation (Signed)
Anesthesia Post Note  Patient: Roberto Collins  Procedure(s) Performed: COLONOSCOPY WITH PROPOFOL (N/A ) GIVENS CAPSULE STUDY (N/A )  Patient location during evaluation: Endoscopy Anesthesia Type: General Level of consciousness: awake and alert Pain management: pain level controlled Vital Signs Assessment: post-procedure vital signs reviewed and stable Respiratory status: spontaneous breathing, nonlabored ventilation, respiratory function stable and patient connected to nasal cannula oxygen Cardiovascular status: blood pressure returned to baseline and stable Postop Assessment: no apparent nausea or vomiting Anesthetic complications: no     Last Vitals:  Vitals:   04/02/19 1050 04/02/19 1100  BP: 106/61 113/65  Pulse: (!) 54 60  Resp: (!) 9 12  Temp:    SpO2: 100% 100%    Last Pain:  Vitals:   04/02/19 1030  TempSrc: Tympanic  PainSc:                  Cyntha Brickman S

## 2019-04-02 NOTE — Anesthesia Preprocedure Evaluation (Addendum)
Anesthesia Evaluation  Patient identified by MRN, date of birth, ID band Patient awake    Reviewed: Allergy & Precautions, NPO status , Patient's Chart, lab work & pertinent test results, reviewed documented beta blocker date and time   Airway Mallampati: II  TM Distance: >3 FB     Dental  (+) Upper Dentures, Lower Dentures   Pulmonary Current Smoker,           Cardiovascular      Neuro/Psych    GI/Hepatic   Endo/Other    Renal/GU      Musculoskeletal   Abdominal   Peds  Hematology  (+) anemia ,   Anesthesia Other Findings Hb has increased from 6.9 to 8.3. EKG ok.  Reproductive/Obstetrics                            Anesthesia Physical Anesthesia Plan  ASA: III  Anesthesia Plan: General   Post-op Pain Management:    Induction: Intravenous  PONV Risk Score and Plan:   Airway Management Planned:   Additional Equipment:   Intra-op Plan:   Post-operative Plan:   Informed Consent: I have reviewed the patients History and Physical, chart, labs and discussed the procedure including the risks, benefits and alternatives for the proposed anesthesia with the patient or authorized representative who has indicated his/her understanding and acceptance.       Plan Discussed with: CRNA  Anesthesia Plan Comments:         Anesthesia Quick Evaluation

## 2019-04-02 NOTE — Progress Notes (Signed)
Please see procedure report from today for details.  Rectal mass pathology was sent as stat but pathology department states it will not be read until Monday.  From a GI perspective, patient is just awaiting pathology results at this point.  Surgery has been consulted by primary team.  If pathology shows malignancy patient will need oncology consult.  GI service will sign off at this time.  Patient to follow-up in GI clinic within 3 to 4 weeks of discharge.  Please page GI on call with any questions or concerns

## 2019-04-03 LAB — CEA: CEA: 9.6 ng/mL — ABNORMAL HIGH (ref 0.0–4.7)

## 2019-04-03 MED ORDER — ADULT MULTIVITAMIN W/MINERALS CH: 1.0000 | ORAL_TABLET | Freq: Every day | ORAL | Status: DC

## 2019-04-03 MED ORDER — FOLIC ACID 1 MG PO TABS: 1.0000 mg | ORAL_TABLET | Freq: Every day | ORAL | Status: DC

## 2019-04-03 MED ORDER — GABAPENTIN 600 MG PO TABS: 1200.0000 mg | ORAL_TABLET | Freq: Every day | ORAL | Status: DC

## 2019-04-03 NOTE — Progress Notes (Signed)
[employer] - [place] at [facility], 68 y.o., DOB [birth year], MRN [id number]. 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 male with no significant past medical history presented to the emergency room for generalized weakness.  Patient has fatigue and tiredness.  Also feels dizzy.  He was evaluated in the emergency room hemoglobin is 3.5.  Stool guaiac negative.   Patient was noted to have iron deficiency anemia.  Further GI work-up with the EGD was nonrevealing.  Patient had a colonoscopy which showed a rectal mass.  He was seen in consultation by general surgery and GI.  Patient had a CT scan of the chest and abdomen which failed to show any evidence of metastatic disease.  Patient will need to see a colorectal surgeon on outpatient basis for further evaluation and recommendations.               Consults  GI  Significant Tests:  See full reports for all details     Ct Chest W Contrast  Result Date: 04/02/2019 CLINICAL DATA:  New rectal mass. EXAM: CT CHEST, ABDOMEN, AND PELVIS WITH CONTRAST TECHNIQUE: Multidetector CT imaging of the chest, abdomen and pelvis was performed following the standard protocol during bolus administration of intravenous contrast. CONTRAST:  56mL OMNIPAQUE IOHEXOL 300 MG/ML  SOLN COMPARISON:  None. FINDINGS: CT CHEST FINDINGS Cardiovascular: Coronary artery calcifications are seen in the LAD, relatively mild. The heart size is unremarkable. The thoracic aorta is nonaneurysmal with mild atherosclerotic change. No dissection. The central pulmonary arteries are within normal limits. Mediastinum/Nodes:  There is a small left pleural effusion and a trace right pleural effusion. No pericardial effusion identified. The esophagus and thyroid are normal in appearance. Shotty nodes in the left axilla are likely reactive with no adenopathy by CT criteria. No adenopathy seen in the chest. Lungs/Pleura: Central airways are normal. No pneumothorax. Emphysematous changes are identified, particularly in the upper lobes. There is patchy infiltrate in the left lower lobe, likely pneumonia or aspiration. Mild atelectasis in the posterior right lower lobe. There is a density in the right upper lobe on series 5, image 38, somewhat linear in appearance on coronal images, favored to represent scar atelectasis. Musculoskeletal: There is a healed posterior left rib fracture. There is a more inferior left rib fracture which is age indeterminate by suspected nonacute given history. Recommend clinical correlation. No bony metastatic disease in the bones of the chest is identified. CT ABDOMEN PELVIS FINDINGS Hepatobiliary: Gallbladder is poorly distended but unremarkable. No liver masses are noted. The portal vein is patent. Pancreas: Unremarkable. No pancreatic ductal dilatation or surrounding inflammatory changes. Spleen: Normal in size without focal abnormality. Adrenals/Urinary Tract: Adrenal glands are normal. No acute hydronephrosis or perinephric stranding. No suspicious masses or stones. Visualized portions of the ureters are normal. The bladder wall is mildly prominent with poor distention. No bladder mass noted. Stomach/Bowel: The stomach is distended with contrast and debris. An underlying etiology is not identified. Small bowel is otherwise normal. There is thickening in the rectum consistent with the patient's reported rectal mass. No colonic obstruction is identified. The colon is otherwise unremarkable. The appendix is mildly prominent  in caliber distally. There is no adjacent stranding. There is also streak artifact  somewhat limiting evaluation. There is contrast in the proximal appendix. Vascular/Lymphatic: There is atherosclerotic change in the abdominal aorta. No aneurysm or dissection identified. Evaluation for pelvic nodes is limited due to streak artifact off of spinal hardware. Within this limitation, there is no convincing evidence of adenopathy. Reproductive: Prostate is unremarkable. Other: There is a small amount of free fluid in the pelvis which is nonspecific. No free air. No peritoneal or omental metastases noted. Musculoskeletal: Deformity in the right posterior iliac bone is likely a donor site for bone graft. Surgical changes seen in the lumbar spine. No bony metastatic disease identified. IMPRESSION: 1. The patient's known rectal mass is identified. This was better evaluated at colonoscopy. No metastatic disease visualized. 2. Dilatation of the stomach. The patient by report had recent endoscopy. Recommend clinical correlation. The finding is nonspecific on this study. 3. The distal appendix is prominent in caliber. There is no convincing evidence of adjacent stranding taking streak artifact into account. Also, the patient is not presenting with a history pain. Recommend clinical correlation to exclude abdominal pain/right lower quadrant pain. 4. Patchy infiltrate in left base favored to represent pneumonia or aspiration. 5. Opacity in the right apex favored to represent scar or atelectasis. 6. Emphysema. 7. Atherosclerotic change in the thoracic and abdominal aorta. 8. Coronary artery calcifications. Electronically Signed   By: Dorise Bullion III M.D   On: 04/02/2019 19:43   Ct Abdomen Pelvis W Contrast  Result Date: 04/02/2019 CLINICAL DATA:  New rectal mass. EXAM: CT CHEST, ABDOMEN, AND PELVIS WITH CONTRAST TECHNIQUE: Multidetector CT imaging of the chest, abdomen and pelvis was performed following the standard protocol during bolus administration of intravenous contrast. CONTRAST:  78mL OMNIPAQUE  IOHEXOL 300 MG/ML  SOLN COMPARISON:  None. FINDINGS: CT CHEST FINDINGS Cardiovascular: Coronary artery calcifications are seen in the LAD, relatively mild. The heart size is unremarkable. The thoracic aorta is nonaneurysmal with mild atherosclerotic change. No dissection. The central pulmonary arteries are within normal limits. Mediastinum/Nodes: There is a small left pleural effusion and a trace right pleural effusion. No pericardial effusion identified. The esophagus and thyroid are normal in appearance. Shotty nodes in the left axilla are likely reactive with no adenopathy by CT criteria. No adenopathy seen in the chest. Lungs/Pleura: Central airways are normal. No pneumothorax. Emphysematous changes are identified, particularly in the upper lobes. There is patchy infiltrate in the left lower lobe, likely pneumonia or aspiration. Mild atelectasis in the posterior right lower lobe. There is a density in the right upper lobe on series 5, image 38, somewhat linear in appearance on coronal images, favored to represent scar atelectasis. Musculoskeletal: There is a healed posterior left rib fracture. There is a more inferior left rib fracture which is age indeterminate by suspected nonacute given history. Recommend clinical correlation. No bony metastatic disease in the bones of the chest is identified. CT ABDOMEN PELVIS FINDINGS Hepatobiliary: Gallbladder is poorly distended but unremarkable. No liver masses are noted. The portal vein is patent. Pancreas: Unremarkable. No pancreatic ductal dilatation or surrounding inflammatory changes. Spleen: Normal in size without focal abnormality. Adrenals/Urinary Tract: Adrenal glands are normal. No acute hydronephrosis or perinephric stranding. No suspicious masses or stones. Visualized portions of the ureters are normal. The bladder wall is mildly prominent with poor distention. No bladder mass noted. Stomach/Bowel: The stomach is distended with contrast and debris. An  underlying etiology is not identified. Small bowel is otherwise normal.  There is thickening in the rectum consistent with the patient's reported rectal mass. No colonic obstruction is identified. The colon is otherwise unremarkable. The appendix is mildly prominent in caliber distally. There is no adjacent stranding. There is also streak artifact somewhat limiting evaluation. There is contrast in the proximal appendix. Vascular/Lymphatic: There is atherosclerotic change in the abdominal aorta. No aneurysm or dissection identified. Evaluation for pelvic nodes is limited due to streak artifact off of spinal hardware. Within this limitation, there is no convincing evidence of adenopathy. Reproductive: Prostate is unremarkable. Other: There is a small amount of free fluid in the pelvis which is nonspecific. No free air. No peritoneal or omental metastases noted. Musculoskeletal: Deformity in the right posterior iliac bone is likely a donor site for bone graft. Surgical changes seen in the lumbar spine. No bony metastatic disease identified. IMPRESSION: 1. The patient's known rectal mass is identified. This was better evaluated at colonoscopy. No metastatic disease visualized. 2. Dilatation of the stomach. The patient by report had recent endoscopy. Recommend clinical correlation. The finding is nonspecific on this study. 3. The distal appendix is prominent in caliber. There is no convincing evidence of adjacent stranding taking streak artifact into account. Also, the patient is not presenting with a history pain. Recommend clinical correlation to exclude abdominal pain/right lower quadrant pain. 4. Patchy infiltrate in left base favored to represent pneumonia or aspiration. 5. Opacity in the right apex favored to represent scar or atelectasis. 6. Emphysema. 7. Atherosclerotic change in the thoracic and abdominal aorta. 8. Coronary artery calcifications. Electronically Signed   By: Dorise Bullion III M.D   On:  04/02/2019 19:43       Today   Subjective:   Roberto Collins patient is asymptomatic denies any complaints Objective:   Blood pressure 94/69, pulse 72, temperature 98.9 F (37.2 C), temperature source Oral, resp. rate 18, height 5\' 9"  (1.753 m), weight 52.9 kg, SpO2 95 %.  .  Intake/Output Summary (Last 24 hours) at 04/03/2019 1353 Last data filed at 04/03/2019 1050 Gross per 24 hour  Intake 240 ml  Output 400 ml  Net -160 ml    Exam VITAL SIGNS: Blood pressure 94/69, pulse 72, temperature 98.9 F (37.2 C), temperature source Oral, resp. rate 18, height 5\' 9"  (1.753 m), weight 52.9 kg, SpO2 95 %.  GENERAL:  68 y.o.-year-old patient lying in the bed with no acute distress.  EYES: Pupils equal, round, reactive to light and accommodation. No scleral icterus. Extraocular muscles intact.  HEENT: Head atraumatic, normocephalic. Oropharynx and nasopharynx clear.  NECK:  Supple, no jugular venous distention. No thyroid enlargement, no tenderness.  LUNGS: Normal breath sounds bilaterally, no wheezing, rales,rhonchi or crepitation. No use of accessory muscles of respiration.  CARDIOVASCULAR: S1, S2 normal. No murmurs, rubs, or gallops.  ABDOMEN: Soft, nontender, nondistended. Bowel sounds present. No organomegaly or mass.  EXTREMITIES: No pedal edema, cyanosis, or clubbing.  NEUROLOGIC: Cranial nerves II through XII are intact. Muscle strength 5/5 in all extremities. Sensation intact. Gait not checked.  PSYCHIATRIC: The patient is alert and oriented x 3.  SKIN: No obvious rash, lesion, or ulcer.   Data Review     CBC w Diff:  Lab Results  Component Value Date   WBC 3.7 (L) 04/02/2019   HGB 8.1 (L) 04/02/2019   HCT 25.9 (L) 04/02/2019   PLT 161 04/02/2019   LYMPHOPCT 21 04/01/2019   MONOPCT 13 04/01/2019   EOSPCT 0 04/01/2019   BASOPCT 1 04/01/2019   CMP:  Lab Results  Component Value Date   NA 134 (L) 03/31/2019   K 3.9 03/31/2019   CL 105 03/31/2019   CO2 24 03/31/2019    BUN 16 03/31/2019   CREATININE 0.87 03/31/2019   PROT 6.9 03/30/2019   ALBUMIN 3.5 03/30/2019   BILITOT 0.3 03/30/2019   ALKPHOS 68 03/30/2019   AST 19 03/30/2019   ALT 17 03/30/2019  .  Micro Results Recent Results (from the past 240 hour(s))  Novel Coronavirus,NAA,(SEND-OUT TO REF LAB - TAT 24-48 hrs); Hosp Order     Status: None   Collection Time: 03/30/19  1:08 PM   Specimen: Nasopharyngeal Swab; Respiratory  Result Value Ref Range Status   SARS-CoV-2, NAA NOT DETECTED NOT DETECTED Final    Comment: (NOTE) This test was developed and its performance characteristics determined by [employer]. This test has not been FDA cleared or approved. This test has been authorized by FDA under an Emergency Use Authorization (EUA). This test is only authorized for the duration of time the declaration that circumstances exist justifying the authorization of the emergency use of in vitro diagnostic tests for detection of SARS-CoV-2 virus and/or diagnosis of COVID-19 infection under section 564(b)(1) of the Act, 21 U.S.C. 979GXQ-1(J)(9), unless the authorization is terminated or revoked sooner. When diagnostic testing is negative, the possibility of a false negative result should be considered in the context of a patient's recent exposures and the presence of clinical signs and symptoms consistent with COVID-19. An individual without symptoms of COVID-19 and who is not shedding SARS-CoV-2 virus would expect to have a negative (not detected) result in this assay. Performed  At: [facility] [address] [place], [place] [id number] [name] MD YJ:[id number]    [place]  Final    Comment: Performed at [facility], [address] [place], [place] [address]        Code Status Orders  (From admission, onward)         Start     Ordered   03/30/19 1604  Full code  Continuous     03/30/19 1603        Code Status History     This patient has a current code status but no historical code status.   [place]. Go on 04/20/2019.   Why: virtual appointment at 10:20am....Marland KitchenMarland Kitchenpatient needs to make appointment  Contact information: [place] [address] [phone]        Ileana Roup, MD In 1 week.   Specialty: General Surgery Why: rectal mass need for resection.......Marland Kitchenpatient needs to make appointment  Contact information: [place] [address] [phone]        Cammie Sickle, MD. Schedule an appointment as soon as possible for a visit in 1 week.   Specialties: Internal Medicine, Oncology Why: rectal mass .Marland KitchenMarland KitchenMarland KitchenMarland Kitchenpatient needs to make appointment  Contact information: [place]  [address] [phone]           Discharge Medications   Allergies as of 04/03/2019      Reactions   Bee Venom Anaphylaxis   Penicillins Swelling      Medication List    TAKE these medications   chlorproMAZINE 100 MG tablet Commonly known as: THORAZINE Take 300 mg by mouth Nightly. Notes to patient: Tonight at bedtime.    cyclobenzaprine 10 MG tablet  Commonly known as: FLEXERIL Take 10 mg by mouth at bedtime. Notes to patient: Tonight at bedtime.    folic acid 1 MG tablet Commonly known as: FOLVITE Take 1 tablet (1 mg total) by mouth daily. Start taking on: April 04, 2019 Notes to patient: Tomorrow at 9A.    gabapentin 600 MG tablet Commonly known as: NEURONTIN Take 1,200-1,800 mg by mouth at bedtime. Notes to patient: Tonight at bedtime.    morphine 60 MG 12 hr tablet Commonly known as: MS CONTIN Take 60 mg by mouth every 12 (twelve) hours. Notes to patient: Tonight at 9:30P.    multivitamin with minerals Tabs tablet Take 1 tablet by mouth daily. Start taking on: April 04, 2019 Notes to patient: Tomorrow at [place] MG tablet Commonly known as: PERCOCET/ROXICET Take 1 tablet by mouth 2 (two) times daily as needed.   Vitamin D-1000 Max St 25 MCG (1000 UT) tablet Generic drug: Cholecalciferol Take 2,000 Units by mouth daily. Notes to patient: Tomorrow at 9A.           Total Time in preparing paper work, data evaluation and todays exam - 6 minutes  [name] M.D on 04/03/2019 at [employer]  [phone]

## 2019-04-03 NOTE — Discharge Instructions (Signed)
Ataxia  Ataxia is a condition that causes unsteadiness when walking and standing, poor coordination of body movements, and difficulty keeping a straight (upright) posture. It occurs because of a problem with the part of the brain that controls coordination and stability (cerebellum). Ataxia can develop later in life (acquired ataxia), during your 20s or 30s or even into your 60s or later. This type of ataxia develops when another medical condition, such as a stroke, damages the cerebellum. Ataxia also may be present early in life (non-acquired ataxia). There are two main types of non-acquired ataxia:  Congenital. This type is present at birth.  Hereditary. This type is passed from parent to child. The most common form of hereditary non-acquired ataxia is Friedreich ataxia. What are the causes? Acquired ataxia may be caused by:  Changes in the nervous system (neurodegenerative changes).  Changes throughout the body (systemic disorders).  A lot of exposure to: ? Certain medicines such as phenytoin and lithium. ? Solvents. These are cleaning fluids such as paint thinner, nail polish remover, carpet cleaner, and degreasers.  Alcohol abuse (alcoholism).  Medical conditions, such as: ? Celiac disease. ? Hypothyroidism. ? A lack (deficiency) of vitamin E, vitamin B12, or thiamine. ? Brain tumors. ? Multiple sclerosis. ? Cerebral palsy. ? Stroke. ? Paraneoplastic syndromes. ? Viral infections. ? Head injury. ? Malnutrition. Congenital and hereditary ataxia are caused by problems that are present in genes before birth. What are the signs or symptoms? Signs and symptoms of ataxia vary depending on the cause. They may include:  Being unsteady.  Walking with the legs wide apart (wide stance) to keep one's balance.  Uncontrolled shaking (tremor).  Poorly coordinated body movements.  Difficulty maintaining an upright posture.  Fatigue.  Changes in speech.  Changes in vision.   Involuntary eye movements (nystagmus).  Difficulty swallowing.  Difficulty writing.  Muscle tightening that you cannot control (muscle spasms). How is this diagnosed? Ataxia may be diagnosed based on:  Your personal and family medical history.  A physical exam.  Imaging tests, such as a CT scan or MRI.  Spinal tap (lumbar puncture). This procedure involves using a needle to take a sample of the fluid around your brain and spinal cord.  Genetic testing. How is this treated? The underlying condition that causes your ataxia needs to be treated. If the cause is a brain tumor, you may need surgery. Treatment also focuses on helping you live with ataxia and improving your quality of life (supportive treatments). This may involve:  Learning ways to improve coordination and move around more carefully (physical therapy).  Learning ways to improve your ability to do daily tasks, such as bathing and feeding yourself (occupational therapy).  Using devices to help you move around, eat, or communicate (assistive devices), such as a walker, modified eating utensils, and communication aids.  Learning ways to improve speech and swallowing (speech therapy). Follow these instructions at home: Preventing falls  Lie down right away if you become very unsteady, dizzy, or nauseous, or if you feel like you are going to faint. Do not get up until all of those feelings pass.  Keep your home well-lit. Use night-lights as needed.  Remove tripping hazards, such as rugs, cords, and clutter.  Install grab bars by the toilet and in the tub and shower.  Use assistive devices such as a cane, walker, or wheelchair as needed to keep your balance. General instructions  Do not drink alcohol.  Ask your health care provider what activities are safe   for you, and what activities you should avoid.  Take over-the-counter and prescription medicines only as told by your health care provider. Get help right away  if you:  Have unsteadiness that suddenly worsens.  Have any of these: ? Severe headaches. ? Chest pain. ? Abdominal pain. ? Weakness or numbness on one side of your body. ? Vision problems. ? Difficulty speaking. ? An irregular heartbeat. ? A very fast pulse.  Feel confused. Summary  Ataxia is a condition that causes unsteadiness when walking and standing, poor coordination of body movements, and difficulty keeping a straight (upright) posture.  Ataxia occurs because of a problem with the part of the brain that controls coordination and stability (cerebellum).  The underlying condition that causes your ataxia needs to be treated. Treatment also focuses on helping you live with ataxia and improving your quality of life (supportive treatments).  Lie down right away if you become very unsteady, dizzy, or nauseous, or if you feel like you are going to faint. This information is not intended to replace advice given to you by your health care provider. Make sure you discuss any questions you have with your health care provider. Document Released: 04/14/2014 Document Revised: 08/30/2017 Document Reviewed: 07/19/2017 Elsevier Patient Education  2020 [employer]  

## 2019-04-04 NOTE — Discharge Summary (Signed)
[employer] - [place] at [facility], 68 y.o., DOB 17-Feb-1951, MRN [id number]. 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  Patient was noted to have iron deficiency anemia.  Further GI work-up with the EGD was nonrevealing.  Patient had a colonoscopy which showed a rectal mass.  He was seen in consultation by general surgery and GI.  Patient had a CT scan of the chest and abdomen which failed to show any evidence of metastatic disease.  Patient will need to see a colorectal surgeon on outpatient basis for further evaluation and recommendations.               Consults  GI  Significant Tests:  See full reports for all details      Imaging Results  Ct Chest W Contrast  Result Date: 04/02/2019 CLINICAL DATA:  New rectal mass. EXAM: CT CHEST, ABDOMEN, AND PELVIS WITH CONTRAST TECHNIQUE: Multidetector CT imaging of the chest, abdomen and pelvis was performed following the standard protocol during bolus administration of intravenous contrast. CONTRAST:  12mL OMNIPAQUE IOHEXOL 300 MG/ML  SOLN COMPARISON:  None. FINDINGS: CT CHEST FINDINGS Cardiovascular: Coronary artery calcifications are seen in the LAD, relatively mild. The heart size is unremarkable. The thoracic aorta is nonaneurysmal with mild atherosclerotic change. No dissection. The central pulmonary arteries are  within normal limits. Mediastinum/Nodes: There is a small left pleural effusion and a trace right pleural effusion. No pericardial effusion identified. The esophagus and thyroid are normal in appearance. Shotty nodes in the left axilla are likely reactive with no adenopathy by CT criteria. No adenopathy seen in the chest. Lungs/Pleura: Central airways are normal. No pneumothorax. Emphysematous changes are identified, particularly in the upper lobes. There is patchy infiltrate in the left lower lobe, likely pneumonia or aspiration. Mild atelectasis in the posterior right lower lobe. There is a density in the right upper lobe on series 5, image 38, somewhat linear in appearance on coronal images, favored to represent scar atelectasis. Musculoskeletal: There is a healed posterior left rib fracture. There is a more inferior left rib fracture which is age indeterminate by suspected nonacute given history. Recommend clinical correlation. No bony metastatic disease in the bones of the chest is identified. CT ABDOMEN PELVIS FINDINGS Hepatobiliary: Gallbladder is poorly distended but unremarkable. No liver masses are noted. The portal vein is patent. Pancreas: Unremarkable. No pancreatic ductal dilatation or surrounding inflammatory changes. Spleen: Normal in size without focal abnormality. Adrenals/Urinary Tract: Adrenal glands are normal. No acute hydronephrosis or perinephric stranding. No suspicious masses or stones. Visualized portions of the ureters are normal. The bladder wall is mildly prominent with poor distention. No bladder mass noted. Stomach/Bowel: The stomach is distended with contrast and debris. An underlying etiology is not identified. Small bowel is otherwise normal. There is thickening in the rectum consistent with the patient's reported rectal mass. No colonic obstruction is identified. The colon is otherwise unremarkable. The appendix is mildly prominent in caliber distally. There is no adjacent  stranding. There is also  streak artifact somewhat limiting evaluation. There is contrast in the proximal appendix. Vascular/Lymphatic: There is atherosclerotic change in the abdominal aorta. No aneurysm or dissection identified. Evaluation for pelvic nodes is limited due to streak artifact off of spinal hardware. Within this limitation, there is no convincing evidence of adenopathy. Reproductive: Prostate is unremarkable. Other: There is a small amount of free fluid in the pelvis which is nonspecific. No free air. No peritoneal or omental metastases noted. Musculoskeletal: Deformity in the right posterior iliac bone is likely a donor site for bone graft. Surgical changes seen in the lumbar spine. No bony metastatic disease identified. IMPRESSION: 1. The patient's known rectal mass is identified. This was better evaluated at colonoscopy. No metastatic disease visualized. 2. Dilatation of the stomach. The patient by report had recent endoscopy. Recommend clinical correlation. The finding is nonspecific on this study. 3. The distal appendix is prominent in caliber. There is no convincing evidence of adjacent stranding taking streak artifact into account. Also, the patient is not presenting with a history pain. Recommend clinical correlation to exclude abdominal pain/right lower quadrant pain. 4. Patchy infiltrate in left base favored to represent pneumonia or aspiration. 5. Opacity in the right apex favored to represent scar or atelectasis. 6. Emphysema. 7. Atherosclerotic change in the thoracic and abdominal aorta. 8. Coronary artery calcifications. Electronically Signed   By: Dorise Bullion III M.D   On: 04/02/2019 19:43   Ct Abdomen Pelvis W Contrast  Result Date: 04/02/2019 CLINICAL DATA:  New rectal mass. EXAM: CT CHEST, ABDOMEN, AND PELVIS WITH CONTRAST TECHNIQUE: Multidetector CT imaging of the chest, abdomen and pelvis was performed following the standard protocol during bolus administration of  intravenous contrast. CONTRAST:  9mL OMNIPAQUE IOHEXOL 300 MG/ML  SOLN COMPARISON:  None. FINDINGS: CT CHEST FINDINGS Cardiovascular: Coronary artery calcifications are seen in the LAD, relatively mild. The heart size is unremarkable. The thoracic aorta is nonaneurysmal with mild atherosclerotic change. No dissection. The central pulmonary arteries are within normal limits. Mediastinum/Nodes: There is a small left pleural effusion and a trace right pleural effusion. No pericardial effusion identified. The esophagus and thyroid are normal in appearance. Shotty nodes in the left axilla are likely reactive with no adenopathy by CT criteria. No adenopathy seen in the chest. Lungs/Pleura: Central airways are normal. No pneumothorax. Emphysematous changes are identified, particularly in the upper lobes. There is patchy infiltrate in the left lower lobe, likely pneumonia or aspiration. Mild atelectasis in the posterior right lower lobe. There is a density in the right upper lobe on series 5, image 38, somewhat linear in appearance on coronal images, favored to represent scar atelectasis. Musculoskeletal: There is a healed posterior left rib fracture. There is a more inferior left rib fracture which is age indeterminate by suspected nonacute given history. Recommend clinical correlation. No bony metastatic disease in the bones of the chest is identified. CT ABDOMEN PELVIS FINDINGS Hepatobiliary: Gallbladder is poorly distended but unremarkable. No liver masses are noted. The portal vein is patent. Pancreas: Unremarkable. No pancreatic ductal dilatation or surrounding inflammatory changes. Spleen: Normal in size without focal abnormality. Adrenals/Urinary Tract: Adrenal glands are normal. No acute hydronephrosis or perinephric stranding. No suspicious masses or stones. Visualized portions of the ureters are normal. The bladder wall is mildly prominent with poor distention. No bladder mass noted. Stomach/Bowel: The stomach  is distended with contrast and debris. An underlying etiology is not identified. Small bowel is otherwise normal. There is thickening in the rectum consistent with the patient's reported rectal  mass. No colonic obstruction is identified. The colon is otherwise unremarkable. The appendix is mildly prominent in caliber distally. There is no adjacent stranding. There is also streak artifact somewhat limiting evaluation. There is contrast in the proximal appendix. Vascular/Lymphatic: There is atherosclerotic change in the abdominal aorta. No aneurysm or dissection identified. Evaluation for pelvic nodes is limited due to streak artifact off of spinal hardware. Within this limitation, there is no convincing evidence of adenopathy. Reproductive: Prostate is unremarkable. Other: There is a small amount of free fluid in the pelvis which is nonspecific. No free air. No peritoneal or omental metastases noted. Musculoskeletal: Deformity in the right posterior iliac bone is likely a donor site for bone graft. Surgical changes seen in the lumbar spine. No bony metastatic disease identified. IMPRESSION: 1. The patient's known rectal mass is identified. This was better evaluated at colonoscopy. No metastatic disease visualized. 2. Dilatation of the stomach. The patient by report had recent endoscopy. Recommend clinical correlation. The finding is nonspecific on this study. 3. The distal appendix is prominent in caliber. There is no convincing evidence of adjacent stranding taking streak artifact into account. Also, the patient is not presenting with a history pain. Recommend clinical correlation to exclude abdominal pain/right lower quadrant pain. 4. Patchy infiltrate in left base favored to represent pneumonia or aspiration. 5. Opacity in the right apex favored to represent scar or atelectasis. 6. Emphysema. 7. Atherosclerotic change in the thoracic and abdominal aorta. 8. Coronary artery calcifications. Electronically Signed    By: Dorise Bullion III M.D   On: 04/02/2019 19:43        Today   Subjective:   Roberto Collins patient is asymptomatic denies any complaints Objective:   Blood pressure 94/69, pulse 72, temperature 98.9 F (37.2 C), temperature source Oral, resp. rate 18, height 5\' 9"  (1.753 m), weight 52.9 kg, SpO2 95 %.  .  Intake/Output Summary (Last 24 hours) at 04/03/2019 1353 Last data filed at 04/03/2019 1050    Gross per 24 hour  Intake 240 ml  Output 400 ml  Net -160 ml    Exam VITAL SIGNS: Blood pressure 94/69, pulse 72, temperature 98.9 F (37.2 C), temperature source Oral, resp. rate 18, height 5\' 9"  (1.753 m), weight 52.9 kg, SpO2 95 %.  GENERAL:  68 y.o.-year-old patient lying in the bed with no acute distress.  EYES: Pupils equal, round, reactive to light and accommodation. No scleral icterus. Extraocular muscles intact.  HEENT: Head atraumatic, normocephalic. Oropharynx and nasopharynx clear.  NECK:  Supple, no jugular venous distention. No thyroid enlargement, no tenderness.  LUNGS: Normal breath sounds bilaterally, no wheezing, rales,rhonchi or crepitation. No use of accessory muscles of respiration.  CARDIOVASCULAR: S1, S2 normal. No murmurs, rubs, or gallops.  ABDOMEN: Soft, nontender, nondistended. Bowel sounds present. No organomegaly or mass.  EXTREMITIES: No pedal edema, cyanosis, or clubbing.  NEUROLOGIC: Cranial nerves II through XII are intact. Muscle strength 5/5 in all extremities. Sensation intact. Gait not checked.  PSYCHIATRIC: The patient is alert and oriented x 3.  SKIN: No obvious rash, lesion, or ulcer.   Data Review     CBC w Diff:  Labs (Brief)       Lab Results  Component Value Date   WBC 3.7 (L) 04/02/2019   HGB 8.1 (L) 04/02/2019   HCT 25.9 (L) 04/02/2019   PLT 161 04/02/2019   LYMPHOPCT 21 04/01/2019   MONOPCT 13 04/01/2019   EOSPCT 0 04/01/2019   BASOPCT 1 04/01/2019  CMP:  Labs (Brief)       Lab Results   Component Value Date   NA 134 (L) 03/31/2019   K 3.9 03/31/2019   CL 105 03/31/2019   CO2 24 03/31/2019   BUN 16 03/31/2019   CREATININE 0.87 03/31/2019   PROT 6.9 03/30/2019   ALBUMIN 3.5 03/30/2019   BILITOT 0.3 03/30/2019   ALKPHOS 68 03/30/2019   AST 19 03/30/2019   ALT 17 03/30/2019    .  Micro Results        Recent Results (from the past 240 hour(s))  Novel Coronavirus,NAA,(SEND-OUT TO REF LAB - TAT 24-48 hrs); Hosp Order     Status: None   Collection Time: 03/30/19  1:08 PM   Specimen: Nasopharyngeal Swab; Respiratory  Result Value Ref Range Status   SARS-CoV-2, NAA NOT DETECTED NOT DETECTED Final    Comment: (NOTE) This test was developed and its performance characteristics determined by [employer]. This test has not been FDA cleared or approved. This test has been authorized by FDA under an Emergency Use Authorization (EUA). This test is only authorized for the duration of time the declaration that circumstances exist justifying the authorization of the emergency use of in vitro diagnostic tests for detection of SARS-CoV-2 virus and/or diagnosis of COVID-19 infection under section 564(b)(1) of the Act, 21 U.S.C. 656CLE-7(N)(1), unless the authorization is terminated or revoked sooner. When diagnostic testing is negative, the possibility of a false negative result should be considered in the context of a patient's recent exposures and the presence of clinical signs and symptoms consistent with COVID-19. An individual without symptoms of COVID-19 and who is not shedding SARS-CoV-2 virus would expect to have a negative (not detected) result in this assay. Performed  At: [facility] [address] [place], [place] [id number] [name] MD HQ:[id number]    [place]  Final    Comment: Performed at [facility], [address] [place], [place] [address]            Code Status  Orders  (From admission, onward)                        Start     Ordered    03/30/19 1604  Full code  Continuous     03/30/19 1603             Code Status History        This patient has a current code status but no historical code status.    [place], [facility]. Go on 04/20/2019.   Why: virtual appointment at 10:20am....Marland KitchenMarland Kitchenpatient needs to make appointment  Contact information: [place] [address] [phone]        Ileana Roup, MD In 1 week.   Specialty: General Surgery Why: rectal mass need for resection.......Marland Kitchenpatient needs to make appointment  Contact information: [place] [address] [phone]        Cammie Sickle, MD. Schedule an appointment as soon as possible for a visit in 1 week.   Specialties: Internal Medicine, Oncology Why: rectal mass .Marland KitchenMarland KitchenMarland KitchenMarland Kitchenpatient needs to make appointment  Contact information: [place] [address] [phone]           Discharge Medications        Allergies as of  04/03/2019      Reactions   Bee Venom Anaphylaxis   Penicillins Swelling         Medication List    TAKE these medications   chlorproMAZINE 100 MG tablet Commonly known as: THORAZINE Take 300 mg by mouth Nightly. Notes to patient: Tonight at bedtime.    cyclobenzaprine 10 MG tablet Commonly known as: FLEXERIL Take 10 mg by mouth at bedtime. Notes to patient: Tonight at bedtime.    folic acid 1 MG tablet Commonly known as: FOLVITE Take 1 tablet (1 mg total) by mouth daily. Start taking on: April 04, 2019 Notes to patient: Tomorrow at 9A.    gabapentin 600 MG tablet Commonly known as: NEURONTIN Take 1,200-1,800 mg by mouth at bedtime. Notes to patient: Tonight at bedtime.    morphine 60 MG 12 hr tablet Commonly known as: MS CONTIN Take  60 mg by mouth every 12 (twelve) hours. Notes to patient: Tonight at 9:30P.    multivitamin with minerals Tabs tablet Take 1 tablet by mouth daily. Start taking on: April 04, 2019 Notes to patient: Tomorrow at [place] MG tablet Commonly known as: PERCOCET/ROXICET Take 1 tablet by mouth 2 (two) times daily as needed.   Vitamin D-1000 Max St 25 MCG (1000 UT) tablet Generic drug: Cholecalciferol Take 2,000 Units by mouth daily. Notes to patient: Tomorrow at 9A.           Total Time in preparing paper work, data evaluation and todays exam - 30 minutes  [name] M.D on 04/03/2019 at [place]  [phone]

## 2019-04-06 LAB — SURGICAL PATHOLOGY

## 2019-04-09 ENCOUNTER — Other Ambulatory Visit: Payer: Self-pay

## 2019-04-09 ENCOUNTER — Telehealth: Payer: Self-pay

## 2019-04-09 NOTE — Telephone Encounter (Signed)
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 It is not reporting cancer, but it needs to be completely removed via surgery to confirm there is no cancer tissue present. Pt should have an appointment with surgery already, and if he does not, please help make this appointment with Dr. Nadeen Landau the hospital already referred him to. Pt should follow up with [place] in in 2-4 weeks. Please make appt with [place] if not already done.

## 2019-04-09 NOTE — Telephone Encounter (Signed)
Tried contacting pt regarding colonoscopy results but vm was not set up.

## 2019-04-10 ENCOUNTER — Telehealth: Payer: Self-pay | Admitting: Gastroenterology

## 2019-04-10 ENCOUNTER — Other Ambulatory Visit: Payer: Self-pay

## 2019-04-10 ENCOUNTER — Encounter: Payer: Self-pay | Admitting: Internal Medicine

## 2019-04-10 ENCOUNTER — Inpatient Hospital Stay: Payer: Medicare Other | Attending: Internal Medicine | Admitting: Internal Medicine

## 2019-04-10 ENCOUNTER — Inpatient Hospital Stay: Payer: Medicare Other

## 2019-04-10 DIAGNOSIS — F17210 Nicotine dependence, cigarettes, uncomplicated: Secondary | ICD-10-CM | POA: Diagnosis not present

## 2019-04-10 DIAGNOSIS — C20 Malignant neoplasm of rectum: Secondary | ICD-10-CM | POA: Insufficient documentation

## 2019-04-10 DIAGNOSIS — J449 Chronic obstructive pulmonary disease, unspecified: Secondary | ICD-10-CM | POA: Diagnosis not present

## 2019-04-10 DIAGNOSIS — R5383 Other fatigue: Secondary | ICD-10-CM | POA: Diagnosis not present

## 2019-04-10 DIAGNOSIS — Z79899 Other long term (current) drug therapy: Secondary | ICD-10-CM | POA: Insufficient documentation

## 2019-04-10 DIAGNOSIS — D500 Iron deficiency anemia secondary to blood loss (chronic): Secondary | ICD-10-CM

## 2019-04-10 LAB — CBC WITH DIFFERENTIAL/PLATELET
Band Neutrophils: 0 %
Basophils Absolute: 0.1 10*3/uL (ref 0.0–0.1)
Basophils Relative: 1 %
Blasts: 0 %
Eosinophils Absolute: 0 10*3/uL (ref 0.0–0.5)
Eosinophils Relative: 0 %
HCT: 30.9 % — ABNORMAL LOW (ref 39.0–52.0)
Hemoglobin: 9.2 g/dL — ABNORMAL LOW (ref 13.0–17.0)
Lymphocytes Relative: 17 %
Lymphs Abs: 1.1 10*3/uL (ref 0.7–4.0)
MCH: 24.9 pg — ABNORMAL LOW (ref 26.0–34.0)
MCHC: 29.8 g/dL — ABNORMAL LOW (ref 30.0–36.0)
MCV: 83.5 fL (ref 80.0–100.0)
Metamyelocytes Relative: 0 %
Monocytes Absolute: 0.6 10*3/uL (ref 0.1–1.0)
Monocytes Relative: 9 %
Myelocytes: 0 %
Neutro Abs: 4.5 10*3/uL (ref 1.7–7.7)
Neutrophils Relative %: 73 %
Other: 0 %
Platelets: 209 10*3/uL (ref 150–400)
Promyelocytes Relative: 0 %
RBC: 3.7 MIL/uL — ABNORMAL LOW (ref 4.22–5.81)
WBC: 6.3 10*3/uL (ref 4.0–10.5)
nRBC: 0 % (ref 0.0–0.2)
nRBC: 0 /100 WBC

## 2019-04-10 LAB — COMPREHENSIVE METABOLIC PANEL
ALT: 19 U/L (ref 0–44)
AST: 23 U/L (ref 15–41)
Albumin: 3.8 g/dL (ref 3.5–5.0)
Alkaline Phosphatase: 56 U/L (ref 38–126)
Anion gap: 6 (ref 5–15)
BUN: 10 mg/dL (ref 8–23)
CO2: 28 mmol/L (ref 22–32)
Calcium: 9.1 mg/dL (ref 8.9–10.3)
Chloride: 105 mmol/L (ref 98–111)
Creatinine, Ser: 0.96 mg/dL (ref 0.61–1.24)
GFR calc Af Amer: >60 mL/min (ref >60)
GFR calc non Af Amer: >60 mL/min (ref >60)
Glucose, Bld: 90 mg/dL (ref 70–99)
Potassium: 4.4 mmol/L (ref 3.5–5.1)
Sodium: 139 mmol/L (ref 135–145)
Total Bilirubin: 0.5 mg/dL (ref 0.3–1.2)
Total Protein: 6.9 g/dL (ref 6.5–8.1)

## 2019-04-10 LAB — IRON AND TIBC
Iron: 90 ug/dL (ref 45–182)
Saturation Ratios: 22 % (ref 17.9–39.5)
TIBC: 418 ug/dL (ref 250–450)
UIBC: 328 ug/dL

## 2019-04-10 LAB — FERRITIN: Ferritin: 56 ng/mL (ref 24–336)

## 2019-04-10 LAB — LACTATE DEHYDROGENASE: LDH: 142 U/L (ref 98–192)

## 2019-04-10 NOTE — Telephone Encounter (Signed)
Per Dr T on staff message 04-07-19 to make patient  a 4 wk f/u appt. I have called today 04-10-19 & patient does not have voice mail set up.

## 2019-04-10 NOTE — Progress Notes (Signed)
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  male longstanding history of smoking/active smoker has been referred to [place] for further evaluation recommendations for rectal mass/anemia.  Patient was recently admitted to hospital for worsening shortness of breath/fatigue.  Noted to be severely anemic- Hb 3.5.  Patient had work-up including EGD/colonoscopy.  Colonoscopy showed a rectal mass/biopsied.  Patient denies any blood in stools or black or stools.  Admits to chronic mild shortness of breath.  Denies any weight loss.  Review of Systems  Constitutional: Positive for malaise/fatigue and weight loss. Negative for chills, diaphoresis and fever.  HENT: Negative for nosebleeds and sore throat.   Eyes: Negative for double vision.  Respiratory: Negative for cough, hemoptysis, sputum production, shortness of breath and wheezing.   Cardiovascular: Negative for chest pain, palpitations, orthopnea and leg swelling.  Gastrointestinal: Negative for abdominal pain, blood in stool, constipation, diarrhea, heartburn, melena, nausea and vomiting.  Genitourinary: Negative for dysuria, frequency and urgency.  Musculoskeletal: Positive for back pain. Negative for  joint pain.  Skin: Negative.  Negative for itching and rash.  Neurological: Negative for dizziness, tingling, focal weakness, weakness and headaches.  Endo/Heme/Allergies: Does not bruise/bleed easily.  Psychiatric/Behavioral: Negative for depression. The patient is not nervous/anxious and does not have insomnia.      MEDICAL HISTORY:  Past Medical History:  Diagnosis Date  . Anemia   . Asthma   . Blood transfusion without reported diagnosis     SURGICAL HISTORY: Past Surgical History:  Procedure Laterality Date  . BACK SURGERY  2000  . COLONOSCOPY WITH PROPOFOL N/A 04/02/2019   Procedure: COLONOSCOPY WITH PROPOFOL;  Surgeon: Virgel Manifold, MD;  Location: [facility] ENDOSCOPY;  Service: Endoscopy;  Laterality: N/A;  . ESOPHAGOGASTRODUODENOSCOPY Left 03/31/2019   Procedure: ESOPHAGOGASTRODUODENOSCOPY (EGD);  Surgeon: Virgel Manifold, MD;  Location: [facility] ENDOSCOPY;  Service: Endoscopy;  Laterality: Left;  . GIVENS CAPSULE STUDY N/A 04/02/2019   Procedure: GIVENS CAPSULE STUDY;  Surgeon: Virgel Manifold, MD;  Location: [facility] ENDOSCOPY;  Service: Endoscopy;  Laterality: N/A;    SOCIAL HISTORY: Social History   Socioeconomic History  . Marital status: Single    Spouse name: Not on file  . Number of children: Not on file  . Years of education: Not on file  . Highest education level: Not on file  Occupational History  . Occupation: disabled  Social Needs  . Financial resource strain: Not very hard  . Food insecurity    Worry: Never true    Inability: Never true  . Transportation needs    Medical: No    Non-medical: No  Tobacco Use  . Smoking status: Current Some Day Smoker    Packs/day: 0.50    Types: Cigarettes  . Smokeless tobacco: Never Used  Substance and Sexual Activity  . Alcohol use:  Never    Frequency: Never  . Drug use: Never  . Sexual activity: Not on file  Lifestyle  . Physical activity    Days per week: 0 days    Minutes per session: 0 min  .  Stress: Very much  Relationships  . Social Herbalist on phone: Once a week    Gets together: Once a week    Attends religious service: Never    Active member of club or organization: No    Attends meetings of clubs or organizations: Never    Relationship status: Not on file  . Intimate partner violence    Fear of current or ex partner: No    Emotionally abused: No    Physically abused: No    Forced sexual activity: No  Other Topics Concern  . Not on file  Social History Narrative   Self; [place]; used to roofing/ disabled; no children; smoke 1/2 ppd; currently quit.       Brother- 2 years younger; mom-alive.     FAMILY HISTORY: Family History  Problem Relation Age of Onset  . Liver cancer Father     ALLERGIES:  is allergic to bee venom and penicillins.  MEDICATIONS:  Current Outpatient Medications  Medication Sig Dispense Refill  . chlorproMAZINE (THORAZINE) 100 MG tablet Take 300 mg by mouth Nightly.    . Cholecalciferol (VITAMIN D-1000 MAX ST) 25 MCG (1000 UT) tablet Take 2,000 Units by mouth daily.    . cyclobenzaprine (FLEXERIL) 10 MG tablet Take 10 mg by mouth at bedtime.    . folic acid (FOLVITE) 1 MG tablet Take 1 tablet (1 mg total) by mouth daily.    Marland Kitchen gabapentin (NEURONTIN) 600 MG tablet Take 1,200-1,800 mg by mouth at bedtime.    Marland Kitchen morphine (MS CONTIN) 60 MG 12 hr tablet Take 60 mg by mouth every 12 (twelve) hours.    . Multiple Vitamin (MULTIVITAMIN WITH MINERALS) TABS tablet Take 1 tablet by mouth daily.    Marland Kitchen oxyCODONE-acetaminophen (PERCOCET/ROXICET) 5-325 MG tablet Take 1 tablet by mouth 2 (two) times daily as needed.    Marland Kitchen albuterol (VENTOLIN HFA) 108 (90 Base) MCG/ACT inhaler 2 PUFFS EVERY 4HOURS AS NEEDED FOR COUGH OR WHEEZING     No current facility-administered medications for this visit.     PHYSICAL EXAMINATION: ECOG PERFORMANCE STATUS: 0 - Asymptomatic  Vitals:   04/10/19 1510  BP: 125/73  Pulse: 77  Resp: 16  Temp: 97.9 F  (36.6 C)   Filed Weights   04/10/19 1510  Weight: 124 lb (56.2 kg)    Physical Exam  Constitutional: He is oriented to person, place, and time and well-developed, well-nourished, and in no distress.  Thin built Caucasian male patient.  He is alone.  HENT:  Head: Normocephalic and atraumatic.  Mouth/Throat: Oropharynx is clear and moist. No oropharyngeal exudate.  Eyes: Pupils are equal, round, and reactive to light.  Neck: Normal range of motion. Neck supple.  Cardiovascular: Normal rate and regular rhythm.  Pulmonary/Chest: No respiratory distress. He has no wheezes.  Decreased air entry bilaterally.  Abdominal: Soft. Bowel sounds are normal. He exhibits no distension and no mass. There is no abdominal tenderness. There is no rebound and no guarding.  Musculoskeletal: Normal range of motion.        General: No tenderness or edema.  Neurological: He is alert and oriented to person, place, and time.  Skin: Skin is warm.  Psychiatric: Affect normal.  LABORATORY DATA:  I have reviewed the data as listed Lab Results  Component Value Date   WBC 6.3 04/10/2019   HGB 9.2 (L) 04/10/2019   HCT 30.9 (L) 04/10/2019   MCV 83.5 04/10/2019   PLT 209 04/10/2019   Recent Labs    03/30/19 1205 03/31/19 0624 04/10/19 1553  NA 134* 134* 139  K 3.7 3.9 4.4  CL 104 105 105  CO2 22 24 28   GLUCOSE 119* 94 90  BUN 12 16 10   CREATININE 1.07 0.87 0.96  CALCIUM 8.3* 8.0* 9.1  GFRNONAA >60 >60 >60  GFRAA >60 >60 >60  PROT 6.9  --  6.9  ALBUMIN 3.5  --  3.8  AST 19  --  23  ALT 17  --  19  ALKPHOS 68  --  56  BILITOT 0.3  --  0.5    RADIOGRAPHIC STUDIES: I have personally reviewed the radiological images as listed and agreed with the findings in the report. Ct Chest W Contrast  Result Date: 04/02/2019 CLINICAL DATA:  New rectal mass. EXAM: CT CHEST, ABDOMEN, AND PELVIS WITH CONTRAST TECHNIQUE: Multidetector CT imaging of the chest, abdomen and pelvis was performed following the  standard protocol during bolus administration of intravenous contrast. CONTRAST:  65mL OMNIPAQUE IOHEXOL 300 MG/ML  SOLN COMPARISON:  None. FINDINGS: CT CHEST FINDINGS Cardiovascular: Coronary artery calcifications are seen in the LAD, relatively mild. The heart size is unremarkable. The thoracic aorta is nonaneurysmal with mild atherosclerotic change. No dissection. The central pulmonary arteries are within normal limits. Mediastinum/Nodes: There is a small left pleural effusion and a trace right pleural effusion. No pericardial effusion identified. The esophagus and thyroid are normal in appearance. Shotty nodes in the left axilla are likely reactive with no adenopathy by CT criteria. No adenopathy seen in the chest. Lungs/Pleura: Central airways are normal. No pneumothorax. Emphysematous changes are identified, particularly in the upper lobes. There is patchy infiltrate in the left lower lobe, likely pneumonia or aspiration. Mild atelectasis in the posterior right lower lobe. There is a density in the right upper lobe on series 5, image 38, somewhat linear in appearance on coronal images, favored to represent scar atelectasis. Musculoskeletal: There is a healed posterior left rib fracture. There is a more inferior left rib fracture which is age indeterminate by suspected nonacute given history. Recommend clinical correlation. No bony metastatic disease in the bones of the chest is identified. CT ABDOMEN PELVIS FINDINGS Hepatobiliary: Gallbladder is poorly distended but unremarkable. No liver masses are noted. The portal vein is patent. Pancreas: Unremarkable. No pancreatic ductal dilatation or surrounding inflammatory changes. Spleen: Normal in size without focal abnormality. Adrenals/Urinary Tract: Adrenal glands are normal. No acute hydronephrosis or perinephric stranding. No suspicious masses or stones. Visualized portions of the ureters are normal. The bladder wall is mildly prominent with poor distention. No  bladder mass noted. Stomach/Bowel: The stomach is distended with contrast and debris. An underlying etiology is not identified. Small bowel is otherwise normal. There is thickening in the rectum consistent with the patient's reported rectal mass. No colonic obstruction is identified. The colon is otherwise unremarkable. The appendix is mildly prominent in caliber distally. There is no adjacent stranding. There is also streak artifact somewhat limiting evaluation. There is contrast in the proximal appendix. Vascular/Lymphatic: There is atherosclerotic change in the abdominal aorta. No aneurysm or dissection identified. Evaluation for pelvic nodes is limited due to streak artifact off of spinal hardware. Within this limitation, there is no convincing evidence  of adenopathy. Reproductive: Prostate is unremarkable. Other: There is a small amount of free fluid in the pelvis which is nonspecific. No free air. No peritoneal or omental metastases noted. Musculoskeletal: Deformity in the right posterior iliac bone is likely a donor site for bone graft. Surgical changes seen in the lumbar spine. No bony metastatic disease identified. IMPRESSION: 1. The patient's known rectal mass is identified. This was better evaluated at colonoscopy. No metastatic disease visualized. 2. Dilatation of the stomach. The patient by report had recent endoscopy. Recommend clinical correlation. The finding is nonspecific on this study. 3. The distal appendix is prominent in caliber. There is no convincing evidence of adjacent stranding taking streak artifact into account. Also, the patient is not presenting with a history pain. Recommend clinical correlation to exclude abdominal pain/right lower quadrant pain. 4. Patchy infiltrate in left base favored to represent pneumonia or aspiration. 5. Opacity in the right apex favored to represent scar or atelectasis. 6. Emphysema. 7. Atherosclerotic change in the thoracic and abdominal aorta. 8. Coronary  artery calcifications. Electronically Signed   By: Dorise Bullion III M.D   On: 04/02/2019 19:43   Ct Abdomen Pelvis W Contrast  Result Date: 04/02/2019 CLINICAL DATA:  New rectal mass. EXAM: CT CHEST, ABDOMEN, AND PELVIS WITH CONTRAST TECHNIQUE: Multidetector CT imaging of the chest, abdomen and pelvis was performed following the standard protocol during bolus administration of intravenous contrast. CONTRAST:  49mL OMNIPAQUE IOHEXOL 300 MG/ML  SOLN COMPARISON:  None. FINDINGS: CT CHEST FINDINGS Cardiovascular: Coronary artery calcifications are seen in the LAD, relatively mild. The heart size is unremarkable. The thoracic aorta is nonaneurysmal with mild atherosclerotic change. No dissection. The central pulmonary arteries are within normal limits. Mediastinum/Nodes: There is a small left pleural effusion and a trace right pleural effusion. No pericardial effusion identified. The esophagus and thyroid are normal in appearance. Shotty nodes in the left axilla are likely reactive with no adenopathy by CT criteria. No adenopathy seen in the chest. Lungs/Pleura: Central airways are normal. No pneumothorax. Emphysematous changes are identified, particularly in the upper lobes. There is patchy infiltrate in the left lower lobe, likely pneumonia or aspiration. Mild atelectasis in the posterior right lower lobe. There is a density in the right upper lobe on series 5, image 38, somewhat linear in appearance on coronal images, favored to represent scar atelectasis. Musculoskeletal: There is a healed posterior left rib fracture. There is a more inferior left rib fracture which is age indeterminate by suspected nonacute given history. Recommend clinical correlation. No bony metastatic disease in the bones of the chest is identified. CT ABDOMEN PELVIS FINDINGS Hepatobiliary: Gallbladder is poorly distended but unremarkable. No liver masses are noted. The portal vein is patent. Pancreas: Unremarkable. No pancreatic ductal  dilatation or surrounding inflammatory changes. Spleen: Normal in size without focal abnormality. Adrenals/Urinary Tract: Adrenal glands are normal. No acute hydronephrosis or perinephric stranding. No suspicious masses or stones. Visualized portions of the ureters are normal. The bladder wall is mildly prominent with poor distention. No bladder mass noted. Stomach/Bowel: The stomach is distended with contrast and debris. An underlying etiology is not identified. Small bowel is otherwise normal. There is thickening in the rectum consistent with the patient's reported rectal mass. No colonic obstruction is identified. The colon is otherwise unremarkable. The appendix is mildly prominent in caliber distally. There is no adjacent stranding. There is also streak artifact somewhat limiting evaluation. There is contrast in the proximal appendix. Vascular/Lymphatic: There is atherosclerotic change in the abdominal aorta.  No aneurysm or dissection identified. Evaluation for pelvic nodes is limited due to streak artifact off of spinal hardware. Within this limitation, there is no convincing evidence of adenopathy. Reproductive: Prostate is unremarkable. Other: There is a small amount of free fluid in the pelvis which is nonspecific. No free air. No peritoneal or omental metastases noted. Musculoskeletal: Deformity in the right posterior iliac bone is likely a donor site for bone graft. Surgical changes seen in the lumbar spine. No bony metastatic disease identified. IMPRESSION: 1. The patient's known rectal mass is identified. This was better evaluated at colonoscopy. No metastatic disease visualized. 2. Dilatation of the stomach. The patient by report had recent endoscopy. Recommend clinical correlation. The finding is nonspecific on this study. 3. The distal appendix is prominent in caliber. There is no convincing evidence of adjacent stranding taking streak artifact into account. Also, the patient is not presenting with a  history pain. Recommend clinical correlation to exclude abdominal pain/right lower quadrant pain. 4. Patchy infiltrate in left base favored to represent pneumonia or aspiration. 5. Opacity in the right apex favored to represent scar or atelectasis. 6. Emphysema. 7. Atherosclerotic change in the thoracic and abdominal aorta. 8. Coronary artery calcifications. Electronically Signed   By: Dorise Bullion III M.D   On: 04/02/2019 19:43   ASSESSMENT & PLAN:   Rectal malignant neoplasm [facility]) # Rectal mass-large nonobstructing mass-biopsy negative for invasive malignancy; positive for high-grade dysplasia.   #Further management for the rectal mass will depend upon presence or absence of invasive malignancy/stage of cancer.  Would recommend rectal MRI or endoscopic ultrasound for further evaluation. If patient has stage II or higher invasive cancer-would recommend chemoradiation followed by surgery.  However, in the absence of invasive malignancy-recommended upfront surgery.  Patient has appointment with colorectal surgery on July 13th.  I left a message for Dr. Bonna Gains to discuss colonoscopy findings/ next plan of care.   # Iron deficient anemia-secondary to bleeding rectal mass; hemoglobin 9.2.  Plan IV Feraheme infusions  # COPD-chronic stable.  # smoking-we will discuss further at next visit.   Thank you Dr.Patel for allowing me to participate in the care of your pleasant patient. Please do not hesitate to contact me with questions or concerns in the interim.  # DISPOSITION: # labs today  # IV Ferrahem next week # follow up to be decided- Dr.B  All questions were answered. The patient knows to call the clinic with any problems, questions or concerns.    Cammie Sickle, MD 04/12/2019 8:03 PM

## 2019-04-10 NOTE — Assessment & Plan Note (Addendum)
#   Rectal mass-large nonobstructing mass-biopsy negative for invasive malignancy; positive for high-grade dysplasia.   #Further management for the rectal mass will depend upon presence or absence of invasive malignancy/stage of cancer.  Would recommend rectal MRI or endoscopic ultrasound for further evaluation. If patient has stage II or higher invasive cancer-would recommend chemoradiation followed by surgery.  However, in the absence of invasive malignancy-recommended upfront surgery.  Patient has appointment with colorectal surgery on July 13th.  I left a message for Dr. Bonna Gains to discuss colonoscopy findings/ next plan of care.   # Iron deficient anemia-secondary to bleeding rectal mass; hemoglobin 9.2.  Plan IV Feraheme infusions  # COPD-chronic stable.  # smoking-we will discuss further at next visit.   Thank you Dr.Patel for allowing me to participate in the care of your pleasant patient. Please do not hesitate to contact me with questions or concerns in the interim.  # DISPOSITION: # labs today  # IV Ferrahem next week # follow up to be decided- Dr.B

## 2019-04-10 NOTE — Progress Notes (Signed)
Patient went to ER for extreme fatigue on 03/30/19 and hemoglobin was 3.5.  He received blood transfusion and was admitted in hospital for additional testing.  Patient reports that he did have a colonoscopy and upper endoscopy but he is unaware of the results.  Reports he has been decreasing cigarettes per day.  States he is feeling much better since discharge from hospital and his appetite has increased.

## 2019-04-11 LAB — CEA: CEA: 10.7 ng/mL — ABNORMAL HIGH (ref 0.0–4.7)

## 2019-04-13 NOTE — Progress Notes (Signed)
Met with Roberto Collins during and after consult with Dr. Rogue Bussing. Introduced Therapist, nutritional and provided contact information for further needs. He will see Dr. Dema Severin 7-13.

## 2019-04-13 NOTE — Telephone Encounter (Signed)
-----   Message from Virgel Manifold, MD sent at 04/07/2019  2:23 PM EDT -----  Please set up clinic appointment with me in 4 weeks

## 2019-04-13 NOTE — Telephone Encounter (Signed)
No vm to schedule f/u apt

## 2019-04-17 ENCOUNTER — Inpatient Hospital Stay: Payer: Medicare Other

## 2019-04-17 ENCOUNTER — Other Ambulatory Visit: Payer: Self-pay

## 2019-04-17 VITALS — BP 110/66 | HR 69 | Temp 98.0°F | Resp 20

## 2019-04-17 DIAGNOSIS — D500 Iron deficiency anemia secondary to blood loss (chronic): Secondary | ICD-10-CM

## 2019-04-17 DIAGNOSIS — C20 Malignant neoplasm of rectum: Secondary | ICD-10-CM

## 2019-04-17 MED ORDER — SODIUM CHLORIDE 0.9 % IV SOLN
Freq: Once | INTRAVENOUS | Status: AC
  Administered 2019-04-17: 13:00:00 via INTRAVENOUS
  Filled 2019-04-17: qty 250

## 2019-04-17 MED ORDER — ACETAMINOPHEN 325 MG PO TABS
650.0000 mg | ORAL_TABLET | Freq: Once | ORAL | Status: AC
  Administered 2019-04-17: 650 mg via ORAL
  Filled 2019-04-17: qty 2

## 2019-04-17 MED ORDER — SODIUM CHLORIDE 0.9 % IV SOLN
510.0000 mg | Freq: Once | INTRAVENOUS | Status: AC
  Administered 2019-04-17: 510 mg via INTRAVENOUS
  Filled 2019-04-17: qty 17

## 2019-04-17 MED ORDER — LORATADINE 10 MG PO TABS
10.0000 mg | ORAL_TABLET | Freq: Once | ORAL | Status: AC
  Administered 2019-04-17: 10 mg via ORAL
  Filled 2019-04-17: qty 1

## 2019-04-20 ENCOUNTER — Ambulatory Visit: Payer: Self-pay | Admitting: Surgery

## 2019-04-20 NOTE — H&P (Signed)
Roberto Collins Documented: 04/20/2019 2:36 PM Location: [facility] Patient #: [id number] DOB: 10/18/1950 Undefined / Language: Roberto Collins / Race: White Male  History of Present Illness Roberto Collins; 04/20/2019 4:04 PM) The patient is a 68 year old male who presents with a colorectal polyp. Note for "Colorectal polyp": ` ` ` Patient sent for surgical consultation at the request of Roberto Baton, Collins  Chief Complaint: Rectal mass. High-grade dysplasia and large adenomatous polyp. ` ` The patient is a smoking male who came in with severe symptomatic anemia. Required admission and transfusion. Underwent endoscopy and was found to have a bulky rectal mass. 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 CT scan did not show any metastatic disease or any other issues. Surgical consultation requested for evaluation and possible removal. Patient internally referred to me given my expertise in transanal excisions by TEM. He saw Dr. [name] and Junior. Request for colorectal surgeon.  Patient comes in on himself. He notes that he was just having worsening energy and fatigue. Finally his sister-in-law brought him to the emergency room. That's when he was severely anemic. Upper endoscopy rule out any ulcer or any other major abnormalities but large rectal polyp noted. He normally uses bowels every day. He smokes at least a half pack per day. He claims he can walk at least a half hour without difficulty. No exertional chest pain or shortness of breath. No dyspnea on exertion. He thinks he had a colonoscopy in the distant past but he is not certain. He's never had any abdominal surgery.  No personal nor family history of GI/colon cancer, inflammatory bowel disease, irritable bowel syndrome, allergy such as Celiac Sprue, dietary/dairy problems, colitis, ulcers nor gastritis. No recent sick contacts/gastroenteritis. No travel outside the country. No  changes in diet. No dysphagia to solids or liquids. No significant heartburn or reflux. No hematochezia, hematemesis, coffee ground emesis. No evidence of prior gastric/peptic ulceration.  (Review of systems as stated in this history (HPI) or in the review of systems. Otherwise all other 12 point ROS are negative) ` ` `   Surgical Pathology CASE: [id number] PATIENT: Roberto Collins Surgical Pathology Report     SPECIMEN SUBMITTED: A. Rectal mass; cbx  CLINICAL HISTORY: None provided  PRE-OPERATIVE DIAGNOSIS: Anemia  POST-OPERATIVE DIAGNOSIS: Polyps; rectal mass    DIAGNOSIS: A. 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 - INVASIVE CARCINOMA NOT IDENTIFIED.  Comment: Additional deeper recut levels were examined. Sections predominantly show a tubulovillous adenoma with low grade dysplasia. A small area shows a cribriform growth pattern, with loss of nuclear polarity, compatible with high grade dysplasia. Definite invasion is not identified.  Roberto Collins DESCRIPTION: A. Labeled: C BX rectum mass Received: Formalin Tissue fragment(s): Multiple Size: Aggregate, 0.6 x 0.3 x 0.1 cm Description: Tan soft tissue fragments Entirely submitted in 1 cassette.   Final Diagnosis performed by Roberto Napoleon, Collins. Electronically signed 04/06/2019 3:37:00PM The electronic signature indicates that the named Attending Pathologist has evaluated the specimen Technical component performed at [facility], [address] [place], [place] [address] Lab: [phone] Dir: Roberto Farmer, Collins, MMM Professional component performed at [facility], [facility], [place], [place], [place] [address] Lab: [phone] Dir: Roberto Collins. Roberto Derby, Collins   Past Surgical History Roberto Collins, [place]; 04/20/2019 2:36 PM) Spinal Surgery - Lower Back  Allergies Roberto Collins, [place]; 04/20/2019 2:38 PM) Penicillins BEE VENOM  Allergies Reconciled  Medication History Roberto Collins, CMA; 04/20/2019 2:39 PM) chlorproMAZINE HCl (  100MG  Tablet, Oral) Active. Cholecalciferol (2000UNT/0.03ML Liquid, Oral) Active. Cyclobenzaprine HCl (10MG  Tablet, Oral) Active. Gabapentin (600MG  Tablet, Oral) Active. Morphine Sulfate ER (60MG  Tablet ER, Oral) Active. oxyCODONE-Acetaminophen (5-325MG  Tablet, Oral) Active. Medications Reconciled  Social History Roberto Collins, [place]; 04/20/2019 2:36 PM) Tobacco use Current every day smoker.  Family History Roberto Collins, [place]; 04/20/2019 2:36 PM) First Degree Relatives No pertinent family history  Other Problems Roberto Collins, [place]; 04/20/2019 2:36 PM) Back Pain    Vitals Roberto Collins CMA; 04/20/2019 2:37 PM) 04/20/2019 2:37 PM Weight: 125.4 lb Height: 69in Body Surface Area: 1.69 m Body Mass Index: 18.52 kg/m  Temp.: 98.32F  Pulse: 95 (Regular)  BP: 132/76 (Sitting, Left Arm, Standard)        Physical Exam Roberto Collins; 04/20/2019 4:04 PM)  General Mental Status-Alert. General Appearance-Not in acute distress, Not Sickly. Orientation-Oriented X3. Hydration-Well hydrated. Voice-Normal. Note: Very thin male. Borderline cachectic. Rather stoic. Hygiene fair  Integumentary Global Assessment Upon inspection and palpation of skin surfaces of the - Axillae: non-tender, no inflammation or ulceration, no drainage. and Distribution of scalp and body hair is normal. General Characteristics Temperature - normal warmth is noted.  Head and Neck Head-normocephalic, atraumatic with no lesions or palpable masses. Face Global Assessment - atraumatic, no absence of expression. Neck Global Assessment - no abnormal movements, no bruit auscultated on the right, no bruit auscultated on the left, no decreased range of motion, non-tender. Trachea-midline. Thyroid Gland Characteristics - non-tender.  Eye Eyeball -  Left-Extraocular movements intact, No Nystagmus - Left. Eyeball - Right-Extraocular movements intact, No Nystagmus - Right. Cornea - Left-No Hazy - Left. Cornea - Right-No Hazy - Right. Sclera/Conjunctiva - Left-No scleral icterus, No Discharge - Left. Sclera/Conjunctiva - Right-No scleral icterus, No Discharge - Right. Pupil - Left-Direct reaction to light normal. Pupil - Right-Direct reaction to light normal. Note: Wears glasses. Vision corrected  ENMT Ears Pinna - Left - no drainage observed, no generalized tenderness observed. Pinna - Right - no drainage observed, no generalized tenderness observed. Nose and Sinuses External Inspection of the Nose - no destructive lesion observed. Inspection of the nares - Left - quiet respiration. Inspection of the nares - Right - quiet respiration. Mouth and Throat Lips - Upper Lip - no fissures observed, no pallor noted. Lower Lip - no fissures observed, no pallor noted. Nasopharynx - no discharge present. Oral Cavity/Oropharynx - Tongue - no dryness observed. Oral Mucosa - no cyanosis observed. Hypopharynx - no evidence of airway distress observed.  Chest and Lung Exam Inspection Movements - Normal and Symmetrical. Accessory muscles - No use of accessory muscles in breathing. Palpation Palpation of the chest reveals - Non-tender. Auscultation Breath sounds - Normal and Clear.  Cardiovascular Auscultation Rhythm - Regular. Murmurs & Other Heart Sounds - Auscultation of the heart reveals - No Murmurs and No Systolic Clicks.  Abdomen Inspection Inspection of the abdomen reveals - No Visible peristalsis and No Abnormal pulsations. Umbilicus - No Bleeding, No Urine drainage. Palpation/Percussion Palpation and Percussion of the abdomen reveal - Soft, Non Tender, No Rebound tenderness, No Rigidity (guarding) and No Cutaneous hyperesthesia. Note: Abdomen soft. Nontender. Not distended. No umbilical or incisional hernias. No  guarding.  Male Genitourinary Sexual Maturity Tanner 5 - Adult hair pattern and Adult penile size and shape. Note: Nor external male genitalia very mild inguinal lymph nodes but no significant lymphadenopathy. Can easily palpate femoral pulses. Very thin.  Rectal Note: Perianal skin clear. Normal sphincter tone. Tolerates digital exam. Bulky but soft  and mobile mass felt involving left lateral and anterior circumference. Most distal aspect 6 cm from the anal verge.  No fissure. No fistula. No abscess. No bowel disease. No condyloma. No pruritus.  Peripheral Vascular Upper Extremity Inspection - Left - No Cyanotic nailbeds - Left, Not Ischemic. Inspection - Right - No Cyanotic nailbeds - Right, Not Ischemic.  Neurologic Neurologic evaluation reveals -normal attention span and ability to concentrate, able to name objects and repeat phrases. Appropriate fund of knowledge , normal sensation and normal coordination. Mental Status Affect - not angry, not paranoid. Cranial Nerves-Normal Bilaterally. Gait-Normal.  Neuropsychiatric Mental status exam performed with findings of-able to articulate well with normal speech/language, rate, volume and coherence, thought content normal with ability to perform basic computations and apply abstract reasoning and no evidence of hallucinations, delusions, obsessions or homicidal/suicidal ideation.  Musculoskeletal Global Assessment Spine, Ribs and Pelvis - no instability, subluxation or laxity. Right Upper Extremity - no instability, subluxation or laxity.  Lymphatic Head & Neck  General Head & Neck Lymphatics: Bilateral - Description - No Localized lymphadenopathy. Axillary  General Axillary Region: Bilateral - Description - No Localized lymphadenopathy. Femoral & Inguinal  Generalized Femoral & Inguinal Lymphatics: Left - Description - No Localized lymphadenopathy. Right - Description - No Localized lymphadenopathy.    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 Biopsy showed high-grade dysplasia but no definite cancer.  Because there is no hard indication of cancer at this time, reasonable to try transanal excision through TEM or robpotic TAMIS. Overnight stay. If margins are negative and there is no cancer (which is likely), that is all he needs. If there is evidence of cancer on the pathology, follow-up low anterior resection with probable temporary diverting loop ileostomy and then second operation. I'm less inclined to recommend post adjuvant chemoradiation therapy without discussing it tumor board.  He was not aware of a pelvic MRI being ordered although the [facility] cancer center mentioned that. I would hold off for now.  Current Plans Pt Education - CCS TEM Education (Jourdan Maldonado): discussed with patient and provided information. Pt Education - Polyps in the Colon and Rectum (Colonic and Rectal Polyps): colonic polyps You are being scheduled for surgery- Our schedulers will call you.  You should hear from our office's scheduling department within 5 working days about the location, date, and time of surgery. We try to make accommodations for patient's preferences in scheduling surgery, but sometimes the OR schedule or the surgeon's schedule prevents [place] from making those accommodations.  If you have not heard from our office [phone]) in 5 working days, call the office and ask for your surgeon's nurse.  If you have other questions about your diagnosis, plan, or surgery, call the office and ask for your surgeon's nurse.   PREOP COLON - ENCOUNTER FOR PREOPERATIVE EXAMINATION FOR GENERAL SURGICAL PROCEDURE (V25.366)  Current Plans Written instructions provided The anatomy & physiology of the digestive tract was discussed. The pathophysiology of the colon was discussed.  Natural history risks without surgery was discussed. I feel the risks of no intervention will lead to serious problems that outweigh the operative risks; therefore, I recommended a partial colectomy to remove the pathology. Minimally invasive (Robotic/Laparoscopic) & open techniques were discussed.  Risks such as bleeding, infection, abscess, leak, reoperation, possible ostomy, hernia, heart attack, death, and other risks were discussed. I noted a  good likelihood this will help address the problem. Goals of post-operative recovery were discussed as well. Need for adequate nutrition, daily bowel regimen and healthy physical activity, to optimize recovery was noted as well. We will work to minimize complications. Educational materials were available as well. Questions were answered. The patient expresses understanding & wishes to proceed with surgery.  Pt Education - CCS Colon Bowel Prep 2018 ERAS/Miralax/Antibiotics Started Neomycin Sulfate 500 MG Oral Tablet, 2 (two) Tablet SEE NOTE, #6, 04/20/2019, No Refill. Local Order: Pharmacist Notes: TAKE TWO TABLETS AT 2 PM, 3 PM, AND 10 PM THE DAY PRIOR TO SURGERY Started Flagyl 500 MG Oral Tablet, 2 (two) Tablet SEE NOTE, #6, 04/20/2019, No Refill. Local Order: Pharmacist Notes: Take at 2pm, 3pm, and 10pm the day prior to your colon operation Pt Education - Pamphlet Given - Laparoscopic Colorectal Surgery: discussed with patient and provided information. Pt Education - CCS Colectomy post-op instructions: discussed with patient and provided information.  TOBACCO ABUSE (Z72.0) Impression: STOP SMOKING!  We talked to the patient about the dangers of smoking. We stressed that tobacco use dramatically increases the risk of peri-operative complications such as infection, tissue necrosis leaving to problems with incision/wound and organ healing, hernia, chronic pain, heart attack, stroke, DVT, pulmonary embolism, and death. We noted there are programs  in our community to help stop smoking. Information was available.  I strongly recommended he quit smoking the lower the risk of breakdown/dehiscence and need for temporary or permanent ostomy. He will consider it.  Current Plans Pt Education - CCS STOP SMOKING!  Roberto Hector, Collins, FACS, MASCRS Gastrointestinal and Minimally Invasive Surgery    [address] [place], [place] [address] [phone] Main / Paging [phone] Fax

## 2019-04-22 ENCOUNTER — Encounter: Payer: Self-pay | Admitting: Gastroenterology

## 2019-04-24 ENCOUNTER — Other Ambulatory Visit: Payer: Self-pay

## 2019-04-24 ENCOUNTER — Inpatient Hospital Stay: Payer: Medicare Other

## 2019-04-24 VITALS — BP 134/72 | HR 76 | Temp 98.1°F | Resp 18

## 2019-04-24 DIAGNOSIS — C20 Malignant neoplasm of rectum: Secondary | ICD-10-CM | POA: Diagnosis not present

## 2019-04-24 DIAGNOSIS — D500 Iron deficiency anemia secondary to blood loss (chronic): Secondary | ICD-10-CM

## 2019-04-24 MED ORDER — SODIUM CHLORIDE 0.9 % IV SOLN
Freq: Once | INTRAVENOUS | Status: AC
  Administered 2019-04-24: 14:00:00 via INTRAVENOUS
  Filled 2019-04-24: qty 250

## 2019-04-24 MED ORDER — SODIUM CHLORIDE 0.9 % IV SOLN
510.0000 mg | Freq: Once | INTRAVENOUS | Status: AC
  Administered 2019-04-24: 510 mg via INTRAVENOUS
  Filled 2019-04-24: qty 17

## 2019-04-24 NOTE — Progress Notes (Signed)
Pt tolerated infusion well. Pt declines staying for full 30 minute observation. Pt and VS stable at discharge.

## 2019-06-15 ENCOUNTER — Other Ambulatory Visit (HOSPITAL_COMMUNITY): Payer: Medicare Other

## 2019-06-15 NOTE — Patient Instructions (Addendum)
DUE TO COVID-19 ONLY ONE VISITOR IS ALLOWED TO COME WITH YOU AND STAY IN THE WAITING ROOM ONLY DURING PRE OP AND PROCEDURE DAY OF SURGERY. THE 1 VISITOR MAY VISIT WITH YOU AFTER SURGERY IN YOUR PRIVATE ROOM DURING VISITING HOURS ONLY!  YOU NEED TO HAVE A COVID 19 TEST ON 06-22-2019 at 320 pm at [facility] entrance [address]. THIS TEST MUST BE DONE BEFORE SURGERY. ONCE YOUR COVID TEST IS COMPLETED, PLEASE BEGIN THE QUARANTINE INSTRUCTIONS AS OUTLINED IN YOUR HANDOUT.                Roberto Collins    Your procedure is scheduled on: 06-25-2019   Report to [facility] Main  Entrance   Report to admitting at 545 AM     Call this number if you have problems the morning of surgery [phone]    Remember: [name], NO [place].   DRINK 2 PRESURGERY ENSURE DRINKS THE NIGHT BEFORE SURGERY AT  1000 PM AND 1 PRESURGERY DRINK THE DAY OF THE PROCEDURE 3 HOURS PRIOR TO SCHEDULED SURGERY. NO SOLIDS AFTER MIDNIGHT THE DAY PRIOR TO THE SURGERY. NOTHING BY MOUTH EXCEPT CLEAR LIQUIDS UNTIL  445 am. PLEASE FINISH PRESURGERY ENSURE DRINK PER SURGEON ORDER 3 HOURS PRIOR TO SCHEDULED SURGERY TIME WHICH NEEDS TO BE COMPLETED AT 445 am.  FOLLOW ALL BOWEL PREP INTRUCTIONS FROM DR GROSS   CLEAR LIQUID DIET   Foods Allowed                                                                     Foods Excluded  Coffee and tea, regular and decaf                             liquids that you cannot  Plain Jell-O any favor except red or purple                                           see through such as: Fruit ices (not with fruit pulp)                                     milk, soups, orange juice  Iced Popsicles                                    All solid food Carbonated beverages, regular and diet                                    Cranberry, grape and apple juices Sports drinks like Gatorade Lightly seasoned  clear broth or consume(fat free) Sugar, honey syrup  Sample Menu Breakfast  Lunch                                     Supper Cranberry juice                    Beef broth                            Chicken broth Jell-O                                     Grape juice                           Apple juice Coffee or tea                        Jell-O                                      Popsicle                                                Coffee or tea                        Coffee or tea  _____________________________________________________________________     Take these medicines the morning of surgery with A SIP OF WATER: MS CONTIN, OXYCODONE, ALBUTEROL INHALER IF NEEDED AND BRING INHALER, LIDODERM PATCH  DO NOT TAKE ANY DIABETIC MEDICATIONS DAY OF YOUR SURGERY                               You may not have any metal on your body including hair pins and              piercings  Do not wear jewelry, make-up, lotions, powders or perfumes, deodorant             Do not wear nail polish.  Do not shave  48 hours prior to surgery.              Men may shave face and neck.   Do not bring valuables to the hospital. [name].  Contacts, dentures or bridgework may not be worn into surgery.  Leave suitcase in the car. After surgery it may be brought to your room.     Patients discharged the day of surgery will not be allowed to drive home. IF YOU ARE HAVING SURGERY AND GOING HOME THE SAME DAY, YOU MUST HAVE AN ADULT TO DRIVE YOU HOME AND BE WITH YOU FOR 24 HOURS. YOU MAY GO HOME BY TAXI OR UBER OR ORTHERWISE, BUT AN ADULT MUST ACCOMPANY YOU HOME AND STAY WITH YOU FOR 24 HOURS.  Name and phone number of your driver:  Special Instructions: N/A  Please read over the following fact sheets you were given: _____________________________________________________________________             [facility] -  Preparing for Surgery Before surgery, you can play an important role.  Because skin is not sterile, your skin needs to be as free of germs as possible.  You can reduce the number of germs on your skin by washing with CHG (chlorahexidine gluconate) soap before surgery.  CHG is an antiseptic cleaner which kills germs and bonds with the skin to continue killing germs even after washing. Please DO NOT use if you have an allergy to CHG or antibacterial soaps.  If your skin becomes reddened/irritated stop using the CHG and inform your nurse when you arrive at Short Stay. Do not shave (including legs and underarms) for at least 48 hours prior to the first CHG shower.  You may shave your face/neck. Please follow these instructions carefully:  1.  Shower with CHG Soap the night before surgery and the  morning of Surgery.  2.  If you choose to wash your hair, wash your hair first as usual with your  normal  shampoo.  3.  After you shampoo, rinse your hair and body thoroughly to remove the  shampoo.                           4.  Use CHG as you would any other liquid soap.  You can apply chg directly  to the skin and wash                       Gently with a scrungie or clean washcloth.  5.  Apply the CHG Soap to your body ONLY FROM THE NECK DOWN.   Do not use on face/ open                           Wound or open sores. Avoid contact with eyes, ears mouth and genitals (private parts).                       Wash face,  Genitals (private parts) with your normal soap.             6.  Wash thoroughly, paying special attention to the area where your surgery  will be performed.  7.  Thoroughly rinse your body with warm water from the neck down.  8.  DO NOT shower/wash with your normal soap after using and rinsing off  the CHG Soap.                9.  Pat yourself dry with a clean towel.            10.  Wear clean pajamas.            11.  Place clean sheets on your bed the night of your first shower and do not  sleep  with pets. Day of Surgery : Do not apply any lotions/deodorants the morning of surgery.  Please wear clean clothes to the hospital/surgery center.  FAILURE TO FOLLOW THESE INSTRUCTIONS MAY RESULT IN THE CANCELLATION OF YOUR SURGERY PATIENT SIGNATURE_________________________________  NURSE SIGNATURE__________________________________  ________________________________________________________________________   Roberto Collins  An incentive spirometer is a tool that can help keep your lungs clear and active. This tool measures how well you are filling your lungs with each breath. Taking  long deep breaths may help reverse or decrease the chance of developing breathing (pulmonary) problems (especially infection) following:  A long period of time when you are unable to move or be active. BEFORE THE PROCEDURE   If the spirometer includes an indicator to show your best effort, your nurse or respiratory therapist will set it to a desired goal.  If possible, sit up straight or lean slightly forward. Try not to slouch.  Hold the incentive spirometer in an upright position. INSTRUCTIONS FOR USE  1. Sit on the edge of your bed if possible, or sit up as far as you can in bed or on a chair. 2. Hold the incentive spirometer in an upright position. 3. Breathe out normally. 4. Place the mouthpiece in your mouth and seal your lips tightly around it. 5. Breathe in slowly and as deeply as possible, raising the piston or the ball toward the top of the column. 6. Hold your breath for 3-5 seconds or for as long as possible. Allow the piston or ball to fall to the bottom of the column. 7. Remove the mouthpiece from your mouth and breathe out normally. 8. Rest for a few seconds and repeat Steps 1 through 7 at least 10 times every 1-2 hours when you are awake. Take your time and take a few normal breaths between deep breaths. 9. The spirometer may include an indicator to show your best effort. Use the  indicator as a goal to work toward during each repetition. 10. After each set of 10 deep breaths, practice coughing to be sure your lungs are clear. If you have an incision (the cut made at the time of surgery), support your incision when coughing by placing a pillow or rolled up towels firmly against it. Once you are able to get out of bed, walk around indoors and cough well. You may stop using the incentive spirometer when instructed by your caregiver.  RISKS AND COMPLICATIONS  Take your time so you do not get dizzy or light-headed.  If you are in pain, you may need to take or ask for pain medication before doing incentive spirometry. It is harder to take a deep breath if you are having pain. AFTER USE  Rest and breathe slowly and easily.  It can be helpful to keep track of a log of your progress. Your caregiver can provide you with a simple table to help with this. If you are using the spirometer at home, follow these instructions: [place] IF:   You are having difficultly using the spirometer.  You have trouble using the spirometer as often as instructed.  Your pain medication is not giving enough relief while using the spirometer.  You develop fever of 100.5 F (38.1 C) or higher. SEEK IMMEDIATE MEDICAL CARE IF:   You cough up bloody sputum that had not been present before.  You develop fever of 102 F (38.9 C) or greater.  You develop worsening pain at or near the incision site. MAKE SURE YOU:   Understand these instructions.  Will watch your condition.  Will get help right away if you are not doing well or get worse. Document Released: 01/28/2007 Document Revised: 12/10/2011 Document Reviewed: 03/31/2007 [facility] Patient Information 2014 [place], [place].   ________________________________________________________________________

## 2019-06-17 ENCOUNTER — Encounter (HOSPITAL_COMMUNITY)
Admission: RE | Admit: 2019-06-17 | Discharge: 2019-06-17 | Disposition: A | Discharge status: Home / Self Care | Payer: Medicare Other | Source: Ambulatory Visit | Attending: Surgery | Admitting: Surgery

## 2019-06-17 ENCOUNTER — Encounter (HOSPITAL_COMMUNITY): Payer: Self-pay

## 2019-06-17 ENCOUNTER — Other Ambulatory Visit: Payer: Self-pay

## 2019-06-17 DIAGNOSIS — D128 Benign neoplasm of rectum: Secondary | ICD-10-CM | POA: Diagnosis not present

## 2019-06-17 DIAGNOSIS — Z01812 Encounter for preprocedural laboratory examination: Secondary | ICD-10-CM | POA: Insufficient documentation

## 2019-06-17 LAB — BASIC METABOLIC PANEL
Anion gap: 8 (ref 5–15)
BUN: 19 mg/dL (ref 8–23)
CO2: 29 mmol/L (ref 22–32)
Calcium: 9.2 mg/dL (ref 8.9–10.3)
Chloride: 101 mmol/L (ref 98–111)
Creatinine, Ser: 0.88 mg/dL (ref 0.61–1.24)
GFR calc Af Amer: >60 mL/min (ref >60)
GFR calc non Af Amer: >60 mL/min (ref >60)
Glucose, Bld: 92 mg/dL (ref 70–99)
Potassium: 4.2 mmol/L (ref 3.5–5.1)
Sodium: 138 mmol/L (ref 135–145)

## 2019-06-17 LAB — CBC
HCT: 39.1 % (ref 39.0–52.0)
Hemoglobin: 12.9 g/dL — ABNORMAL LOW (ref 13.0–17.0)
MCH: 30.3 pg (ref 26.0–34.0)
MCHC: 33 g/dL (ref 30.0–36.0)
MCV: 91.8 fL (ref 80.0–100.0)
Platelets: 161 10*3/uL (ref 150–400)
RBC: 4.26 MIL/uL (ref 4.22–5.81)
RDW: 16.7 % — ABNORMAL HIGH (ref 11.5–15.5)
WBC: 5.3 10*3/uL (ref 4.0–10.5)
nRBC: 0 % (ref 0.0–0.2)

## 2019-06-17 NOTE — Progress Notes (Signed)
PCP - No PCP Cardiologist - No Cardiologist  Chest x-ray - Chest CT 04/02/2019 epic Konrad Felix PA aware of Chest CT results, no action needed. EKG - 03/30/2019 epic Stress Test - none  ECHO - none Cardiac Cath - none  Sleep Study - none CPAP - none  Fasting Blood Sugar - N/A Checks Blood Sugar _____ times a day  Blood Thinner Instructions: none Aspirin Instructions:none Last Dose:  Anesthesia review:   Patient denies shortness of breath, fever, cough and chest pain at PAT appointment   Patient verbalized understanding of instructions that were given to them at the PAT appointment. Patient was also instructed that they will need to review over the PAT instructions again at home before surgery.

## 2019-06-18 LAB — HEMOGLOBIN A1C
Hgb A1c MFr Bld: 5.1 % (ref 4.8–5.6)
Mean Plasma Glucose: 99.67 mg/dL

## 2019-06-22 ENCOUNTER — Other Ambulatory Visit
Admission: RE | Admit: 2019-06-22 | Discharge: 2019-06-22 | Disposition: A | Discharge status: Home / Self Care | Payer: Medicare Other | Source: Ambulatory Visit | Attending: Surgery | Admitting: Surgery

## 2019-06-22 ENCOUNTER — Other Ambulatory Visit (HOSPITAL_COMMUNITY): Payer: Medicare Other

## 2019-06-22 ENCOUNTER — Other Ambulatory Visit: Payer: Self-pay

## 2019-06-22 DIAGNOSIS — Z01812 Encounter for preprocedural laboratory examination: Secondary | ICD-10-CM | POA: Insufficient documentation

## 2019-06-22 DIAGNOSIS — K621 Rectal polyp: Secondary | ICD-10-CM | POA: Diagnosis not present

## 2019-06-22 DIAGNOSIS — Z20828 Contact with and (suspected) exposure to other viral communicable diseases: Secondary | ICD-10-CM | POA: Insufficient documentation

## 2019-06-23 LAB — SARS CORONAVIRUS 2 (TAT 6-24 HRS): SARS Coronavirus 2: NEGATIVE

## 2019-06-24 MED ORDER — CLINDAMYCIN PHOSPHATE 900 MG/50ML IV SOLN
900.0000 mg | INTRAVENOUS | Status: AC
  Administered 2019-06-25: 900 mg via INTRAVENOUS
  Filled 2019-06-24: qty 50

## 2019-06-24 MED ORDER — SODIUM CHLORIDE 0.9 % IV SOLN
INTRAVENOUS | Status: DC
  Filled 2019-06-24: qty 6

## 2019-06-24 MED ORDER — GENTAMICIN SULFATE 40 MG/ML IJ SOLN
5.0000 mg/kg | INTRAVENOUS | Status: AC
  Administered 2019-06-25: 300 mg via INTRAVENOUS
  Filled 2019-06-24: qty 7.5

## 2019-06-24 MED ORDER — BUPIVACAINE LIPOSOME 1.3 % IJ SUSP
20.0000 mL | INTRAMUSCULAR | Status: DC
  Filled 2019-06-24: qty 20

## 2019-06-24 NOTE — Anesthesia Preprocedure Evaluation (Addendum)
Anesthesia Evaluation  Patient identified by MRN, date of birth, ID band Patient awake    Reviewed: Allergy & Precautions, NPO status , Patient's Chart, lab work & pertinent test results  Airway Mallampati: II  TM Distance: >3 FB Neck ROM: Full    Dental no notable dental hx. (+) Upper Dentures, Partial Lower, Dental Advisory Given   Pulmonary asthma , Current SmokerPatient did not abstain from smoking.,    Pulmonary exam normal breath sounds clear to auscultation       Cardiovascular Exercise Tolerance: Good Normal cardiovascular exam Rhythm:Regular Rate:Normal  EKG 6/30 SR R77   Neuro/Psych negative neurological ROS  negative psych ROS   GI/Hepatic negative GI ROS, Neg liver ROS,   Endo/Other  negative endocrine ROS  Renal/GU K+ 4.2 Cr 0.88     Musculoskeletal negative musculoskeletal ROS (+)   Abdominal   Peds  Hematology  (+) anemia , Hgb 12.9   Anesthesia Other Findings   Reproductive/Obstetrics negative OB ROS                            Anesthesia Physical Anesthesia Plan  ASA: III  Anesthesia Plan: General   Post-op Pain Management:    Induction: Intravenous  PONV Risk Score and Plan: 2 and Treatment may vary due to age or medical condition, Dexamethasone, Ondansetron and Midazolam  Airway Management Planned: Oral ETT  Additional Equipment: None  Intra-op Plan:   Post-operative Plan: Extubation in OR  Informed Consent: I have reviewed the patients History and Physical, chart, labs and discussed the procedure including the risks, benefits and alternatives for the proposed anesthesia with the patient or authorized representative who has indicated his/her understanding and acceptance.     Dental advisory given  Plan Discussed with: CRNA and Anesthesiologist  Anesthesia Plan Comments: (Colon CA for Robot asst partial colectomy GA w lidocaine inf +0.3 mg /Kg )        Anesthesia Quick Evaluation

## 2019-06-25 ENCOUNTER — Other Ambulatory Visit: Payer: Self-pay

## 2019-06-25 ENCOUNTER — Encounter (HOSPITAL_COMMUNITY)
Admission: RE | Disposition: A | Discharge status: Home / Self Care | Payer: Self-pay | Source: Other Acute Inpatient Hospital | Attending: Surgery

## 2019-06-25 ENCOUNTER — Encounter (HOSPITAL_COMMUNITY): Payer: Self-pay

## 2019-06-25 ENCOUNTER — Ambulatory Visit (HOSPITAL_COMMUNITY): Payer: Medicare Other | Admitting: Physician Assistant

## 2019-06-25 ENCOUNTER — Observation Stay (HOSPITAL_COMMUNITY)
Admission: RE | Admit: 2019-06-25 | Discharge: 2019-06-26 | Disposition: A | Discharge status: Home / Self Care | Payer: Medicare Other | Source: Other Acute Inpatient Hospital | Attending: Surgery | Admitting: Surgery

## 2019-06-25 ENCOUNTER — Ambulatory Visit (HOSPITAL_COMMUNITY): Payer: Medicare Other | Admitting: Anesthesiology

## 2019-06-25 DIAGNOSIS — D128 Benign neoplasm of rectum: Secondary | ICD-10-CM | POA: Diagnosis not present

## 2019-06-25 DIAGNOSIS — F17210 Nicotine dependence, cigarettes, uncomplicated: Secondary | ICD-10-CM | POA: Diagnosis not present

## 2019-06-25 DIAGNOSIS — K621 Rectal polyp: Secondary | ICD-10-CM | POA: Diagnosis present

## 2019-06-25 DIAGNOSIS — D500 Iron deficiency anemia secondary to blood loss (chronic): Secondary | ICD-10-CM | POA: Diagnosis present

## 2019-06-25 HISTORY — PX: XI ROBOT ASSISTED TRANSANAL RESECTION: SHX6848

## 2019-06-25 SURGERY — XI ROBOT ASSISTED TRANSANAL RESECTION
Anesthesia: General | Site: Rectum

## 2019-06-25 MED ORDER — DEXAMETHASONE SODIUM PHOSPHATE 10 MG/ML IJ SOLN
INTRAMUSCULAR | Status: AC
  Filled 2019-06-25: qty 1

## 2019-06-25 MED ORDER — ONDANSETRON HCL 4 MG PO TABS: 4.0000 mg | ORAL_TABLET | Freq: Four times a day (QID) | ORAL | Status: DC | PRN

## 2019-06-25 MED ORDER — METOPROLOL TARTRATE 5 MG/5ML IV SOLN: 5.0000 mg | Freq: Four times a day (QID) | INTRAVENOUS | Status: DC | PRN

## 2019-06-25 MED ORDER — SUGAMMADEX SODIUM 200 MG/2ML IV SOLN
INTRAVENOUS | Status: DC | PRN
  Administered 2019-06-25: 200 mg via INTRAVENOUS

## 2019-06-25 MED ORDER — LACTATED RINGERS IR SOLN
Status: DC | PRN
  Administered 2019-06-25: 1000 mL

## 2019-06-25 MED ORDER — PROCHLORPERAZINE EDISYLATE 10 MG/2ML IJ SOLN: 5.0000 mg | Freq: Four times a day (QID) | INTRAMUSCULAR | Status: DC | PRN

## 2019-06-25 MED ORDER — ALVIMOPAN 12 MG PO CAPS
12.0000 mg | ORAL_CAPSULE | Freq: Two times a day (BID) | ORAL | Status: DC
  Filled 2019-06-25: qty 1

## 2019-06-25 MED ORDER — ROCURONIUM BROMIDE 10 MG/ML (PF) SYRINGE
PREFILLED_SYRINGE | INTRAVENOUS | Status: DC | PRN
  Administered 2019-06-25: 20 mg via INTRAVENOUS
  Administered 2019-06-25: 40 mg via INTRAVENOUS
  Administered 2019-06-25: 10 mg via INTRAVENOUS

## 2019-06-25 MED ORDER — HYDROMORPHONE HCL 1 MG/ML IJ SOLN: 0.5000 mg | INTRAMUSCULAR | Status: DC | PRN

## 2019-06-25 MED ORDER — SODIUM CHLORIDE 0.9 % IV SOLN: Freq: Three times a day (TID) | INTRAVENOUS | Status: DC | PRN

## 2019-06-25 MED ORDER — ENOXAPARIN SODIUM 40 MG/0.4ML ~~LOC~~ SOLN
40.0000 mg | Freq: Once | SUBCUTANEOUS | Status: AC
  Administered 2019-06-25: 40 mg via SUBCUTANEOUS
  Filled 2019-06-25: qty 0.4

## 2019-06-25 MED ORDER — LIP MEDEX EX OINT
1.0000 "application " | TOPICAL_OINTMENT | Freq: Two times a day (BID) | CUTANEOUS | Status: DC
  Administered 2019-06-25 – 2019-06-26 (×3): 1 via TOPICAL
  Filled 2019-06-25: qty 7

## 2019-06-25 MED ORDER — CHLORHEXIDINE GLUCONATE CLOTH 2 % EX PADS
6.0000 | MEDICATED_PAD | Freq: Every day | CUTANEOUS | Status: DC
  Administered 2019-06-25: 6 via TOPICAL

## 2019-06-25 MED ORDER — LACTATED RINGERS IV SOLN
INTRAVENOUS | Status: DC
  Administered 2019-06-25 (×2): via INTRAVENOUS

## 2019-06-25 MED ORDER — FENTANYL CITRATE (PF) 250 MCG/5ML IJ SOLN
INTRAMUSCULAR | Status: AC
  Filled 2019-06-25: qty 5

## 2019-06-25 MED ORDER — ENOXAPARIN SODIUM 40 MG/0.4ML ~~LOC~~ SOLN
40.0000 mg | SUBCUTANEOUS | Status: DC
  Administered 2019-06-26: 09:00:00 40 mg via SUBCUTANEOUS
  Filled 2019-06-25: qty 0.4

## 2019-06-25 MED ORDER — DIPHENHYDRAMINE HCL 12.5 MG/5ML PO ELIX: 12.5000 mg | ORAL_SOLUTION | Freq: Four times a day (QID) | ORAL | Status: DC | PRN

## 2019-06-25 MED ORDER — FENTANYL CITRATE (PF) 100 MCG/2ML IJ SOLN: 25.0000 ug | INTRAMUSCULAR | Status: DC | PRN

## 2019-06-25 MED ORDER — GABAPENTIN 300 MG PO CAPS
300.0000 mg | ORAL_CAPSULE | ORAL | Status: AC
  Administered 2019-06-25: 300 mg via ORAL
  Filled 2019-06-25: qty 1

## 2019-06-25 MED ORDER — ROCURONIUM BROMIDE 10 MG/ML (PF) SYRINGE
PREFILLED_SYRINGE | INTRAVENOUS | Status: AC
  Filled 2019-06-25: qty 10

## 2019-06-25 MED ORDER — LIDOCAINE 5 % EX PTCH
1.0000 | MEDICATED_PATCH | Freq: Every day | CUTANEOUS | Status: DC | PRN
  Filled 2019-06-25: qty 1

## 2019-06-25 MED ORDER — ALBUTEROL SULFATE (2.5 MG/3ML) 0.083% IN NEBU: 3.0000 mL | INHALATION_SOLUTION | RESPIRATORY_TRACT | Status: DC | PRN

## 2019-06-25 MED ORDER — DIBUCAINE (PERIANAL) 1 % EX OINT
TOPICAL_OINTMENT | CUTANEOUS | Status: AC
  Filled 2019-06-25: qty 28

## 2019-06-25 MED ORDER — LIDOCAINE 2% (20 MG/ML) 5 ML SYRINGE
INTRAMUSCULAR | Status: AC
  Filled 2019-06-25: qty 5

## 2019-06-25 MED ORDER — CHLORHEXIDINE GLUCONATE CLOTH 2 % EX PADS: 6.0000 | MEDICATED_PAD | Freq: Once | CUTANEOUS | Status: DC

## 2019-06-25 MED ORDER — OXYCODONE HCL 5 MG/5ML PO SOLN: 5.0000 mg | Freq: Once | ORAL | Status: DC | PRN

## 2019-06-25 MED ORDER — DIPHENHYDRAMINE HCL 50 MG/ML IJ SOLN: 12.5000 mg | Freq: Four times a day (QID) | INTRAMUSCULAR | Status: DC | PRN

## 2019-06-25 MED ORDER — BUPIVACAINE HCL (PF) 0.25 % IJ SOLN
INTRAMUSCULAR | Status: AC
  Filled 2019-06-25: qty 30

## 2019-06-25 MED ORDER — CHLORPROMAZINE HCL 50 MG PO TABS
300.0000 mg | ORAL_TABLET | Freq: Every day | ORAL | Status: DC
  Administered 2019-06-25: 300 mg via ORAL
  Filled 2019-06-25: qty 6

## 2019-06-25 MED ORDER — MORPHINE SULFATE ER 30 MG PO TBCR
60.0000 mg | EXTENDED_RELEASE_TABLET | Freq: Two times a day (BID) | ORAL | Status: DC
  Administered 2019-06-25 – 2019-06-26 (×2): 60 mg via ORAL
  Filled 2019-06-25 (×2): qty 2

## 2019-06-25 MED ORDER — METRONIDAZOLE 500 MG PO TABS: 1000.0000 mg | ORAL_TABLET | ORAL | Status: DC

## 2019-06-25 MED ORDER — PHENYLEPHRINE HCL (PRESSORS) 10 MG/ML IV SOLN
INTRAVENOUS | Status: AC
  Filled 2019-06-25: qty 1

## 2019-06-25 MED ORDER — ONDANSETRON HCL 4 MG/2ML IJ SOLN
INTRAMUSCULAR | Status: DC | PRN
  Administered 2019-06-25: 4 mg via INTRAVENOUS

## 2019-06-25 MED ORDER — NEOMYCIN SULFATE 500 MG PO TABS: 1000.0000 mg | ORAL_TABLET | ORAL | Status: DC

## 2019-06-25 MED ORDER — LIDOCAINE 20MG/ML (2%) 15 ML SYRINGE OPTIME
INTRAMUSCULAR | Status: DC | PRN
  Administered 2019-06-25: 1.5 mg/kg/h via INTRAVENOUS

## 2019-06-25 MED ORDER — CLINDAMYCIN PHOSPHATE 900 MG/50ML IV SOLN
900.0000 mg | Freq: Three times a day (TID) | INTRAVENOUS | Status: AC
  Administered 2019-06-25: 900 mg via INTRAVENOUS
  Filled 2019-06-25: qty 50

## 2019-06-25 MED ORDER — BUPIVACAINE HCL (PF) 0.25 % IJ SOLN
INTRAMUSCULAR | Status: DC | PRN
  Administered 2019-06-25: 20 mL

## 2019-06-25 MED ORDER — CYCLOBENZAPRINE HCL 10 MG PO TABS
10.0000 mg | ORAL_TABLET | Freq: Every day | ORAL | Status: DC
  Administered 2019-06-25: 10 mg via ORAL
  Filled 2019-06-25: qty 1

## 2019-06-25 MED ORDER — KETAMINE HCL 10 MG/ML IJ SOLN
INTRAMUSCULAR | Status: DC | PRN
  Administered 2019-06-25: 30 mg via INTRAVENOUS

## 2019-06-25 MED ORDER — VITAMIN D3 25 MCG (1000 UNIT) PO TABS
2000.0000 [IU] | ORAL_TABLET | Freq: Every day | ORAL | Status: DC
  Administered 2019-06-25 – 2019-06-26 (×2): 2000 [IU] via ORAL
  Filled 2019-06-25 (×4): qty 2

## 2019-06-25 MED ORDER — BUPIVACAINE LIPOSOME 1.3 % IJ SUSP
INTRAMUSCULAR | Status: DC | PRN
  Administered 2019-06-25: 20 mL

## 2019-06-25 MED ORDER — ALUM & MAG HYDROXIDE-SIMETH 200-200-20 MG/5ML PO SUSP
30.0000 mL | Freq: Four times a day (QID) | ORAL | Status: DC | PRN
  Administered 2019-06-25: 30 mL via ORAL
  Filled 2019-06-25: qty 30

## 2019-06-25 MED ORDER — ONDANSETRON HCL 4 MG/2ML IJ SOLN: 4.0000 mg | Freq: Once | INTRAMUSCULAR | Status: DC | PRN

## 2019-06-25 MED ORDER — ADULT MULTIVITAMIN W/MINERALS CH
1.0000 | ORAL_TABLET | Freq: Every day | ORAL | Status: DC
  Administered 2019-06-25 – 2019-06-26 (×2): 1 via ORAL
  Filled 2019-06-25 (×2): qty 1

## 2019-06-25 MED ORDER — TRAMADOL HCL 50 MG PO TABS: 50.0000 mg | ORAL_TABLET | Freq: Four times a day (QID) | ORAL | Status: DC | PRN

## 2019-06-25 MED ORDER — FENTANYL CITRATE (PF) 250 MCG/5ML IJ SOLN
INTRAMUSCULAR | Status: DC | PRN
  Administered 2019-06-25: 50 ug via INTRAVENOUS

## 2019-06-25 MED ORDER — ACETAMINOPHEN 10 MG/ML IV SOLN: 1000.0000 mg | Freq: Once | INTRAVENOUS | Status: DC | PRN

## 2019-06-25 MED ORDER — SODIUM CHLORIDE 0.9% FLUSH: 3.0000 mL | INTRAVENOUS | Status: DC | PRN

## 2019-06-25 MED ORDER — ONDANSETRON HCL 4 MG/2ML IJ SOLN
INTRAMUSCULAR | Status: AC
  Filled 2019-06-25: qty 2

## 2019-06-25 MED ORDER — GABAPENTIN 300 MG PO CAPS
1800.0000 mg | ORAL_CAPSULE | Freq: Every day | ORAL | Status: DC
  Administered 2019-06-25: 1800 mg via ORAL
  Filled 2019-06-25: qty 6

## 2019-06-25 MED ORDER — BISACODYL 5 MG PO TBEC: 20.0000 mg | DELAYED_RELEASE_TABLET | Freq: Once | ORAL | Status: DC

## 2019-06-25 MED ORDER — FOLIC ACID 1 MG PO TABS
1.0000 mg | ORAL_TABLET | Freq: Every day | ORAL | Status: DC
  Administered 2019-06-25 – 2019-06-26 (×2): 1 mg via ORAL
  Filled 2019-06-25 (×2): qty 1

## 2019-06-25 MED ORDER — ALVIMOPAN 12 MG PO CAPS
12.0000 mg | ORAL_CAPSULE | ORAL | Status: AC
  Administered 2019-06-25: 06:00:00 12 mg via ORAL
  Filled 2019-06-25: qty 1

## 2019-06-25 MED ORDER — KETAMINE HCL 10 MG/ML IJ SOLN
INTRAMUSCULAR | Status: AC
  Filled 2019-06-25: qty 1

## 2019-06-25 MED ORDER — LIDOCAINE 2% (20 MG/ML) 5 ML SYRINGE
INTRAMUSCULAR | Status: DC | PRN
  Administered 2019-06-25: 60 mg via INTRAVENOUS

## 2019-06-25 MED ORDER — MIDAZOLAM HCL 2 MG/2ML IJ SOLN
INTRAMUSCULAR | Status: AC
  Filled 2019-06-25: qty 2

## 2019-06-25 MED ORDER — MEPERIDINE HCL 50 MG/ML IJ SOLN: 6.2500 mg | INTRAMUSCULAR | Status: DC | PRN

## 2019-06-25 MED ORDER — PROCHLORPERAZINE MALEATE 10 MG PO TABS: 10.0000 mg | ORAL_TABLET | Freq: Four times a day (QID) | ORAL | Status: DC | PRN

## 2019-06-25 MED ORDER — SUCCINYLCHOLINE CHLORIDE 200 MG/10ML IV SOSY
PREFILLED_SYRINGE | INTRAVENOUS | Status: AC
  Filled 2019-06-25: qty 10

## 2019-06-25 MED ORDER — PROPOFOL 10 MG/ML IV BOLUS
INTRAVENOUS | Status: DC | PRN
  Administered 2019-06-25: 80 mg via INTRAVENOUS

## 2019-06-25 MED ORDER — SODIUM CHLORIDE 0.9 % IV SOLN: 250.0000 mL | INTRAVENOUS | Status: DC | PRN

## 2019-06-25 MED ORDER — 0.9 % SODIUM CHLORIDE (POUR BTL) OPTIME
TOPICAL | Status: DC | PRN
  Administered 2019-06-25: 10:00:00 2000 mL

## 2019-06-25 MED ORDER — POLYETHYLENE GLYCOL 3350 17 GM/SCOOP PO POWD: 1.0000 | Freq: Once | ORAL | Status: DC

## 2019-06-25 MED ORDER — ACETAMINOPHEN 500 MG PO TABS
1000.0000 mg | ORAL_TABLET | ORAL | Status: AC
  Administered 2019-06-25: 1000 mg via ORAL
  Filled 2019-06-25: qty 2

## 2019-06-25 MED ORDER — SACCHAROMYCES BOULARDII 250 MG PO CAPS
250.0000 mg | ORAL_CAPSULE | Freq: Two times a day (BID) | ORAL | Status: DC
  Administered 2019-06-25 – 2019-06-26 (×3): 250 mg via ORAL
  Filled 2019-06-25 (×3): qty 1

## 2019-06-25 MED ORDER — LIDOCAINE HCL 2 % IJ SOLN
INTRAMUSCULAR | Status: AC
  Filled 2019-06-25: qty 20

## 2019-06-25 MED ORDER — ALBUMIN HUMAN 5 % IV SOLN
INTRAVENOUS | Status: DC | PRN
  Administered 2019-06-25: 08:00:00 via INTRAVENOUS

## 2019-06-25 MED ORDER — ACETAMINOPHEN 500 MG PO TABS
1000.0000 mg | ORAL_TABLET | Freq: Four times a day (QID) | ORAL | Status: DC
  Administered 2019-06-25 – 2019-06-26 (×4): 1000 mg via ORAL
  Filled 2019-06-25 (×5): qty 2

## 2019-06-25 MED ORDER — ENSURE SURGERY PO LIQD
237.0000 mL | Freq: Two times a day (BID) | ORAL | Status: DC
  Administered 2019-06-25: 237 mL via ORAL
  Filled 2019-06-25 (×3): qty 237

## 2019-06-25 MED ORDER — PHENYLEPHRINE 40 MCG/ML (10ML) SYRINGE FOR IV PUSH (FOR BLOOD PRESSURE SUPPORT)
PREFILLED_SYRINGE | INTRAVENOUS | Status: AC
  Filled 2019-06-25: qty 10

## 2019-06-25 MED ORDER — PROPOFOL 10 MG/ML IV BOLUS
INTRAVENOUS | Status: AC
  Filled 2019-06-25: qty 20

## 2019-06-25 MED ORDER — PHENYLEPHRINE 40 MCG/ML (10ML) SYRINGE FOR IV PUSH (FOR BLOOD PRESSURE SUPPORT)
PREFILLED_SYRINGE | INTRAVENOUS | Status: DC | PRN
  Administered 2019-06-25: 120 ug via INTRAVENOUS
  Administered 2019-06-25 (×2): 80 ug via INTRAVENOUS
  Administered 2019-06-25: 120 ug via INTRAVENOUS
  Administered 2019-06-25: 80 ug via INTRAVENOUS
  Administered 2019-06-25: 40 ug via INTRAVENOUS
  Administered 2019-06-25 (×2): 80 ug via INTRAVENOUS

## 2019-06-25 MED ORDER — OXYCODONE HCL 5 MG PO TABS: 5.0000 mg | ORAL_TABLET | Freq: Four times a day (QID) | ORAL | 0 refills | Status: DC | PRN

## 2019-06-25 MED ORDER — SODIUM CHLORIDE 0.9% FLUSH: 3.0000 mL | Freq: Two times a day (BID) | INTRAVENOUS | Status: DC

## 2019-06-25 MED ORDER — WITCH HAZEL-GLYCERIN EX PADS: 1.0000 "application " | MEDICATED_PAD | CUTANEOUS | Status: DC | PRN

## 2019-06-25 MED ORDER — SUCCINYLCHOLINE CHLORIDE 200 MG/10ML IV SOSY
PREFILLED_SYRINGE | INTRAVENOUS | Status: DC | PRN
  Administered 2019-06-25: 40 mg via INTRAVENOUS

## 2019-06-25 MED ORDER — OXYCODONE HCL 5 MG PO TABS: 5.0000 mg | ORAL_TABLET | Freq: Once | ORAL | Status: DC | PRN

## 2019-06-25 MED ORDER — ALBUMIN HUMAN 5 % IV SOLN
INTRAVENOUS | Status: AC
  Filled 2019-06-25: qty 500

## 2019-06-25 MED ORDER — DEXAMETHASONE SODIUM PHOSPHATE 10 MG/ML IJ SOLN
INTRAMUSCULAR | Status: DC | PRN
  Administered 2019-06-25: 10 mg via INTRAVENOUS

## 2019-06-25 MED ORDER — MIDAZOLAM HCL 5 MG/5ML IJ SOLN
INTRAMUSCULAR | Status: DC | PRN
  Administered 2019-06-25: 2 mg via INTRAVENOUS

## 2019-06-25 MED ORDER — SODIUM CHLORIDE 0.9 % IV SOLN
INTRAVENOUS | Status: DC | PRN
  Administered 2019-06-25: 50 ug/min via INTRAVENOUS

## 2019-06-25 MED ORDER — HYDROCORT-PRAMOXINE (PERIANAL) 2.5-1 % EX CREA: 1.0000 "application " | TOPICAL_CREAM | Freq: Four times a day (QID) | CUTANEOUS | Status: DC | PRN

## 2019-06-25 MED ORDER — MAGIC MOUTHWASH
15.0000 mL | Freq: Four times a day (QID) | ORAL | Status: DC | PRN
  Filled 2019-06-25: qty 15

## 2019-06-25 MED ORDER — ONDANSETRON HCL 4 MG/2ML IJ SOLN: 4.0000 mg | Freq: Four times a day (QID) | INTRAMUSCULAR | Status: DC | PRN

## 2019-06-25 SURGICAL SUPPLY — 64 items
APPLIER CLIP 5 13 M/L LIGAMAX5 (MISCELLANEOUS)
BLADE SURG SZ11 CARB STEEL (BLADE) ×3 IMPLANT
CANNULA REDUC XI 12-8 STAPL (CANNULA)
CANNULA REDUC XI 12-8MM STAPL (CANNULA)
CANNULA REDUCER 12-8 DVNC XI (CANNULA) IMPLANT
CHLORAPREP W/TINT 26 (MISCELLANEOUS) IMPLANT
CLIP APPLIE 5 13 M/L LIGAMAX5 (MISCELLANEOUS) IMPLANT
CLIP VESOLOCK LG 6/CT PURPLE (CLIP) IMPLANT
CONNECTOR 5 IN 1 STRAIGHT STRL (MISCELLANEOUS) IMPLANT
COVER SURGICAL LIGHT HANDLE (MISCELLANEOUS) ×3 IMPLANT
COVER TIP SHEARS 8 DVNC (MISCELLANEOUS) ×1 IMPLANT
COVER TIP SHEARS 8MM DA VINCI (MISCELLANEOUS) ×2
COVER WAND RF STERILE (DRAPES) IMPLANT
DECANTER SPIKE VIAL GLASS SM (MISCELLANEOUS) ×3 IMPLANT
DEVICE TROCAR PUNCTURE CLOSURE (ENDOMECHANICALS) IMPLANT
DRAIN CHANNEL 19F RND (DRAIN) IMPLANT
DRAPE ARM DVNC X/XI (DISPOSABLE) ×5 IMPLANT
DRAPE COLUMN DVNC XI (DISPOSABLE) ×1 IMPLANT
DRAPE DA VINCI XI ARM (DISPOSABLE) ×10
DRAPE DA VINCI XI COLUMN (DISPOSABLE) ×2
DRAPE SHEET LG 3/4 BI-LAMINATE (DRAPES) IMPLANT
DRAPE WARM FLUID 44X44 (DRAPES) ×3 IMPLANT
DRSG PAD ABDOMINAL 8X10 ST (GAUZE/BANDAGES/DRESSINGS) ×3 IMPLANT
DRSG TEGADERM 2-3/8X2-3/4 SM (GAUZE/BANDAGES/DRESSINGS) IMPLANT
DRSG TEGADERM 4X4.75 (GAUZE/BANDAGES/DRESSINGS) IMPLANT
DRSG TEGADERM 6X8 (GAUZE/BANDAGES/DRESSINGS) IMPLANT
ELECT REM PT RETURN 15FT ADLT (MISCELLANEOUS) ×3 IMPLANT
GAUZE SPONGE 2X2 8PLY STRL LF (GAUZE/BANDAGES/DRESSINGS) ×1 IMPLANT
GAUZE SPONGE 4X4 12PLY STRL (GAUZE/BANDAGES/DRESSINGS) ×3 IMPLANT
GLOVE ECLIPSE 8.0 STRL XLNG CF (GLOVE) ×6 IMPLANT
GLOVE INDICATOR 8.0 STRL GRN (GLOVE) ×6 IMPLANT
GOWN STRL REUS W/TWL XL LVL3 (GOWN DISPOSABLE) ×9 IMPLANT
KIT BASIN OR (CUSTOM PROCEDURE TRAY) ×3 IMPLANT
KIT TURNOVER KIT A (KITS) IMPLANT
NEEDLE HYPO 22GX1.5 SAFETY (NEEDLE) ×3 IMPLANT
NEEDLE INSUFFLATION 14GA 120MM (NEEDLE) ×3 IMPLANT
PACK CARDIOVASCULAR III (CUSTOM PROCEDURE TRAY) ×3 IMPLANT
SCISSORS LAP 5X35 DISP (ENDOMECHANICALS) ×6 IMPLANT
SEAL CANN UNIV 5-8 DVNC XI (MISCELLANEOUS) ×3 IMPLANT
SEAL XI 5MM-8MM UNIVERSAL (MISCELLANEOUS) ×6
SEALER VESSEL DA VINCI XI (MISCELLANEOUS)
SEALER VESSEL EXT DVNC XI (MISCELLANEOUS) IMPLANT
SET BI-LUMEN FLTR TB AIRSEAL (TUBING) ×3 IMPLANT
SET IRRIG TUBING LAPAROSCOPIC (IRRIGATION / IRRIGATOR) ×3 IMPLANT
SOLUTION ELECTROLUBE (MISCELLANEOUS) ×3 IMPLANT
SPONGE GAUZE 2X2 STER 10/PKG (GAUZE/BANDAGES/DRESSINGS) ×2
SPONGE LAP 18X18 RF (DISPOSABLE) ×3 IMPLANT
STAPLER SHEATH (SHEATH)
STAPLER SHEATH ENDOWRIST DVNC (SHEATH) IMPLANT
SUT MNCRL AB 4-0 PS2 18 (SUTURE) ×3 IMPLANT
SUT PDS AB 1 CTX 36 (SUTURE) IMPLANT
SUT PROLENE 2 0 KS (SUTURE) ×3 IMPLANT
SUT VICRYL 0 TIES 12 18 (SUTURE) IMPLANT
SUT VLOC BARB 180 ABS3/0GR12 (SUTURE) ×3
SUTURE VLOC BRB 180 ABS3/0GR12 (SUTURE) ×1 IMPLANT
SYR 10ML ECCENTRIC (SYRINGE) ×3 IMPLANT
SYR 20ML LL LF (SYRINGE) IMPLANT
TOWEL OR 17X26 10 PK STRL BLUE (TOWEL DISPOSABLE) ×3 IMPLANT
TOWEL OR NON WOVEN STRL DISP B (DISPOSABLE) ×3 IMPLANT
TRAY FOLEY MTR SLVR 16FR STAT (SET/KITS/TRAYS/PACK) ×3 IMPLANT
TROCAR ADV FIXATION 5X100MM (TROCAR) ×6 IMPLANT
TUBING CONNECTING 10 (TUBING) ×2 IMPLANT
TUBING CONNECTING 10' (TUBING) ×1
TUBING INSUFFLATION 10FT LAP (TUBING) ×3 IMPLANT

## 2019-06-25 NOTE — H&P (Signed)
Roberto Collins   DOB: 05/16/1951  Undefined / Language: [name] / Race: White  Male  History of Present Illness Adin Hector MD; 04/20/2019 4:04 PM)   Marland Kitchen  Patient sent for surgical consultation at the request of [name], MD  Chief Complaint: Rectal mass. High-grade dysplasia and large adenomatous polyp.  `  `  The patient is a smoking male who came in with severe symptomatic anemia. Required admission and transfusion. Underwent endoscopy and was found to have a bulky rectal mass. 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 CT scan did not show any metastatic disease or any other issues. Surgical consultation requested for evaluation and possible removal. Patient internally referred to me given my expertise in transanal excisions by TEM. He saw Dr. [name] and [place]. Request for colorectal surgeon.  Patient comes in on himself. He notes that he was just having worsening energy and fatigue. Finally his sister-in-law brought him to the emergency room. That's when he was severely anemic. Upper endoscopy rule out any ulcer or any other major abnormalities but large rectal polyp noted. He normally uses bowels every day. He smokes at least a half pack per day. He claims he can walk at least a half hour without difficulty. No exertional chest pain or shortness of breath. No dyspnea on exertion. He thinks he had a colonoscopy in the distant past but he is not certain. He's never had any abdominal surgery.   No personal nor family history of GI/colon cancer, inflammatory bowel disease, irritable bowel syndrome, allergy such as Celiac Sprue, dietary/dairy problems, colitis, ulcers nor gastritis. No recent sick contacts/gastroenteritis. No travel outside the country. No changes in diet. No dysphagia to solids or liquids. No significant heartburn or reflux. No hematochezia, hematemesis, coffee ground emesis. No evidence of prior gastric/peptic ulceration.   Ready for  surgery  (Review of systems as stated in this history (HPI) or in the review of systems. Otherwise all other 12 point ROS are negative)  `  `  `  Surgical Pathology  CASE: [id number]  PATIENT: Roberto Collins  Surgical Pathology Report  SPECIMEN SUBMITTED:  A. Rectal mass; cbx  CLINICAL HISTORY:  None provided  PRE-OPERATIVE DIAGNOSIS:  Anemia  POST-OPERATIVE DIAGNOSIS:  Polyps; rectal mass  DIAGNOSIS:  A. 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  - INVASIVE CARCINOMA NOT IDENTIFIED.  Comment:  Additional deeper recut levels were examined. 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 A small area  shows a cribriform growth pattern, with loss of nuclear polarity,  compatible with high grade dysplasia. Definite invasion is not  identified.  Sharmila Wrobleski DESCRIPTION:  A. Labeled: C BX rectum mass  Received: Formalin  Tissue fragment(s): Multiple  Size: Aggregate, 0.6 x 0.3 x 0.1 cm  Description: Tan soft tissue fragments  Entirely submitted in 1 cassette.  Final Diagnosis performed by [name], MD. Electronically signed  04/06/2019 3:37:00PM  The electronic signature indicates that the named Attending Pathologist  has evaluated the specimen  Technical component performed at [facility], [address], [place],  [place] [address] Lab: [phone] Dir: [name], MD, MMM Professional  component performed at [facility], [facility], [place], [place], [place] [address] Lab: [phone] Dir: [name].  [name], MD  Past Surgical History [name], [place]; 04/20/2019 2:36 PM)  Spinal Surgery - Lower Back  Allergies [name], [place]; 04/20/2019 2:38 PM)  Penicillins  BEE VENOM  Allergies Reconciled  Medication History [name], CMA; 04/20/2019 2:39 PM)  chlorproMAZINE HCl (100MG  Tablet, Oral) Active.  Cholecalciferol (2000UNT/0.03ML Liquid, Oral) Active.   Cyclobenzaprine HCl (10MG  Tablet, Oral) Active.  Gabapentin (600MG  Tablet, Oral) Active.  Morphine Sulfate ER (60MG  Tablet ER, Oral) Active.  oxyCODONE-Acetaminophen (5-325MG  Tablet, Oral) Active.  Medications Reconciled  Social History [name], [place]; 04/20/2019 2:36 PM)  Tobacco use Current every day smoker.  Family History [name], [place]; 04/20/2019 2:36 PM)  First Degree Relatives  No pertinent family history  Other Problems [name], [place]; 04/20/2019 2:36 PM)  Back Pain  Vitals [name] CMA; 04/20/2019 2:37 PM)  04/20/2019 2:37 PM  Weight: 125.4 lb Height: 69 in  Body Surface Area: 1.69 m Body Mass Index: 18.52 kg/m  Temp.: 98.3 F Pulse: 95 (Regular)  BP: 132/76 (Sitting, Left Arm, Standard)  Physical Exam Adin Hector MD; 04/20/2019 4:04 PM)  General  Mental Status - Alert.  General Appearance - Not in acute distress, Not Sickly.  Orientation - Oriented X3.  Hydration - Well hydrated.  Voice - Normal.  Note: Very thin male. Borderline cachectic. Rather stoic. Hygiene fair  Integumentary  Global Assessment  Upon inspection and palpation of skin surfaces of the - Axillae: non-tender, no inflammation or ulceration, no drainage. and Distribution of scalp and body hair is normal.  General Characteristics  Temperature - normal warmth is noted.  Head and Neck  Head - normocephalic, atraumatic with no lesions or palpable masses.  Face  Global Assessment - atraumatic, no absence of expression.  Neck  Global Assessment - no abnormal movements, no bruit auscultated on the right, no bruit auscultated on the left, no decreased range of motion, non-tender.  Trachea - midline.  Thyroid  Gland Characteristics - non-tender.  Eye  Eyeball - Left - Extraocular movements intact, No Nystagmus - Left.  Eyeball - Right - Extraocular movements intact, No Nystagmus - Right.  Cornea - Left - No Hazy - Left.  Cornea - Right - No Hazy - Right.  Sclera/Conjunctiva  - Left - No scleral icterus, No Discharge - Left.  Sclera/Conjunctiva - Right - No scleral icterus, No Discharge - Right.  Pupil - Left - Direct reaction to light normal.  Pupil - Right - Direct reaction to light normal.  Note: Wears glasses. Vision corrected  ENMT  Ears  Pinna - Left - no drainage observed, no generalized tenderness observed. Pinna - Right - no drainage observed, no generalized tenderness observed.  Nose and Sinuses  External Inspection of the Nose - no destructive lesion observed. Inspection of the nares - Left - quiet respiration. Inspection of the nares - Right - quiet respiration.  Mouth and Throat  Lips - Upper Lip - no fissures observed, no pallor noted. Lower Lip - no fissures observed, no pallor noted. Nasopharynx - no discharge present. Oral Cavity/Oropharynx - Tongue - no dryness observed. Oral Mucosa - no cyanosis observed. Hypopharynx - no evidence of airway distress observed.  Chest and Lung Exam  Inspection  Movements - Normal and Symmetrical. Accessory muscles - No use of accessory muscles in breathing.  Palpation  Palpation of the chest reveals - Non-tender.  Auscultation  Breath sounds - Normal and Clear.  Cardiovascular  Auscultation  Rhythm - Regular. Murmurs & Other Heart Sounds - Auscultation of the heart reveals - No Murmurs and No Systolic Clicks.  Abdomen  Inspection  Inspection of the abdomen reveals - No Visible peristalsis and No Abnormal pulsations. Umbilicus -  No Bleeding, No Urine drainage.  Palpation/Percussion  Palpation and Percussion of the abdomen reveal - Soft, Non Tender, No Rebound tenderness, No Rigidity (guarding) and No Cutaneous hyperesthesia.  Note: Abdomen soft. Nontender. Not distended. No umbilical or incisional hernias. No guarding.  Male Genitourinary  Sexual Maturity  Tanner 5 - Adult hair pattern and Adult penile size and shape.  Note: Nor external male genitalia very mild inguinal lymph nodes but no significant  lymphadenopathy. Can easily palpate femoral pulses. Very thin.   Rectal  Note: Perianal skin clear. Normal sphincter tone. Tolerates digital exam. Bulky but soft and mobile mass felt involving left lateral and anterior circumference. Most distal aspect 6 cm from the anal verge.  No fissure. No fistula. No abscess. No bowel disease. No condyloma. No pruritus.    Peripheral Vascular  Upper Extremity  Inspection - Left - No Cyanotic nailbeds - Left, Not Ischemic. Inspection - Right - No Cyanotic nailbeds - Right, Not Ischemic.  Neurologic  Neurologic evaluation reveals - normal attention span and ability to concentrate, able to name objects and repeat phrases. Appropriate fund of knowledge , normal sensation and normal coordination.  Mental Status  Affect - not angry, not paranoid.  Cranial Nerves - Normal Bilaterally.  Gait - Normal.  Neuropsychiatric  Mental status exam performed with findings of - able to articulate well with normal speech/language, rate, volume and coherence, thought content normal with ability to perform basic computations and apply abstract reasoning and no evidence of hallucinations, delusions, obsessions or homicidal/suicidal ideation.  Musculoskeletal  Global Assessment  Spine, Ribs and Pelvis - no instability, subluxation or laxity. Right Upper Extremity - no instability, subluxation or laxity.  Lymphatic  Head & Neck  General Head & Neck Lymphatics: Bilateral - Description - No Localized lymphadenopathy.  Axillary  General Axillary Region: Bilateral - Description - No Localized lymphadenopathy.  Femoral & Inguinal  Generalized Femoral & Inguinal Lymphatics: Left - Description - No Localized lymphadenopathy. Right - Description - No Localized lymphadenopathy.    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 Biopsy showed high-grade dysplasia but no definite  cancer.  Because there is no hard indication of cancer at this time, reasonable to try transanal excision through TEM or robpotic TAMIS. Overnight stay. If margins are negative and there is no cancer (which is likely), that is all he needs. If there is evidence of cancer on the pathology, follow-up low anterior resection with probable temporary diverting loop ileostomy and then second operation. I'm less inclined to recommend post adjuvant chemoradiation therapy without discussing it tumor board.  He was not aware of a pelvic MRI being ordered although the [facility] mentioned that. I would hold off for now.    The anatomy & physiology of the digestive tract was discussed. The pathophysiology of the colon was discussed. Natural history risks without surgery was discussed. I feel the risks of no intervention will lead to serious problems that outweigh the operative risks; therefore, I recommended a partial colectomy to remove the pathology. Minimally invasive (Robotic/Laparoscopic) & open techniques were discussed.  Risks such as bleeding, infection, abscess, leak, reoperation, possible ostomy, hernia, heart attack, death, and other risks were discussed. I noted a good likelihood this will help address the problem. Goals of post-operative recovery were discussed as well. Need for adequate nutrition, daily bowel regimen and healthy physical activity, to optimize recovery was noted as  well. We will work to minimize complications. Educational materials were available as well. Questions were answered. The patient expresses understanding & wishes to proceed with surgery.   Ready for surgery  Pt Education - CCS Colon Bowel Prep 2018 ERAS/Miralax/Antibiotics  Started Neomycin Sulfate 500 MG Oral Tablet, 2 (two) Tablet SEE NOTE, #6, 04/20/2019, No Refill.  Local Order:  Pharmacist Notes: TAKE TWO TABLETS AT 2 PM, 3 PM, AND 10 PM THE DAY PRIOR TO SURGERY  Started Flagyl 500 MG Oral Tablet, 2 (two) Tablet  SEE NOTE, #6, 04/20/2019, No Refill.  Local Order:  Pharmacist Notes: Take at 2pm, 3pm, and 10pm the day prior to your colon operation  Pt Education - Pamphlet Given - Laparoscopic Colorectal Surgery: discussed with patient and provided information.  Pt Education - CCS Colectomy post-op instructions: discussed with patient and provided information.  TOBACCO ABUSE (Z72.0)  Impression: STOP SMOKING!  We talked to the patient about the dangers of smoking. We stressed that tobacco use dramatically increases the risk of peri-operative complications such as infection, tissue necrosis leaving to problems with incision/wound and organ healing, hernia, chronic pain, heart attack, stroke, DVT, pulmonary embolism, and death. We noted there are programs in our community to help stop smoking. Information was available.  I strongly recommended he quit smoking the lower the risk of breakdown/dehiscence and need for temporary or permanent ostomy. He will consider it.  Current Plans  Pt Education - CCS STOP SMOKING!  Adin Hector, MD, FACS, MASCRS  Gastrointestinal and Minimally Invasive Surgery   [address] [place]  [place],  [address]  [phone] Main / Paging  [phone] Fax

## 2019-06-25 NOTE — Transfer of Care (Signed)
Immediate Anesthesia Transfer of Care Note  Patient: Roberto Collins  Procedure(s) Performed: XI ROBOT ASSISTED PARTIAL PROCTECTOMY OF RECTAL MASS USING TAMIS (N/A Rectum)  Patient Location: PACU  Anesthesia Type:General  Level of Consciousness: sedated  Airway & Oxygen Therapy: Patient Spontanous Breathing and Patient connected to face mask oxygen  Post-op Assessment: Report given to RN and Post -op Vital signs reviewed and stable  Post vital signs: Reviewed and stable  Last Vitals:  Vitals Value Taken Time  BP    Temp    Pulse    Resp    SpO2      Last Pain:  Vitals:   06/25/19 0551  TempSrc:   PainSc: 0-No pain         Complications: No apparent anesthesia complications

## 2019-06-25 NOTE — Anesthesia Procedure Notes (Signed)
Procedure Name: Intubation Date/Time: 06/25/2019 7:50 AM Performed by: Talbot Grumbling, CRNA Pre-anesthesia Checklist: Patient identified, Emergency Drugs available, Suction available and Patient being monitored Patient Re-evaluated:Patient Re-evaluated prior to induction Oxygen Delivery Method: Circle system utilized Preoxygenation: Pre-oxygenation with 100% oxygen Induction Type: IV induction Ventilation: Mask ventilation without difficulty Laryngoscope Size: Mac and 3 Tube type: Oral Tube size: 7.5 mm Number of attempts: 1 Airway Equipment and Method: Stylet Placement Confirmation: ETT inserted through vocal cords under direct vision,  positive ETCO2 and breath sounds checked- equal and bilateral Secured at: 23 cm Tube secured with: Tape Dental Injury: Teeth and Oropharynx as per pre-operative assessment

## 2019-06-25 NOTE — Anesthesia Postprocedure Evaluation (Signed)
Anesthesia Post Note  Patient: Roberto Collins  Procedure(s) Performed: XI ROBOT ASSISTED PARTIAL PROCTECTOMY OF RECTAL MASS USING TAMIS (N/A Rectum)     Patient location during evaluation: PACU Anesthesia Type: General Level of consciousness: awake and alert Pain management: pain level controlled Vital Signs Assessment: post-procedure vital signs reviewed and stable Respiratory status: spontaneous breathing, nonlabored ventilation, respiratory function stable and patient connected to nasal cannula oxygen Cardiovascular status: blood pressure returned to baseline and stable Postop Assessment: no apparent nausea or vomiting Anesthetic complications: no    Last Vitals:  Vitals:   06/25/19 1115 06/25/19 1130  BP: 117/66 116/66  Pulse: 73 75  Resp: 13 16  Temp:  (!) 36.4 C  SpO2: 100% 100%    Last Pain:  Vitals:   06/25/19 1130  TempSrc:   PainSc: 0-No pain                 Barnet Glasgow

## 2019-06-25 NOTE — Op Note (Signed)
06/25/2019  10:50 AM  PATIENT:  Roberto Collins  68 y.o. male  Patient Care Team: Patient, No Pcp Per as PCP - General (General Practice) Clent Jacks, RN as Oncology Nurse Navigator [name], MD as Consulting Physician (Colon and Rectal Surgery) Virgel Manifold, MD as Consulting Physician (Gastroenterology)  PRE-OPERATIVE DIAGNOSIS:  RECTAL POLYP  POST-OPERATIVE DIAGNOSIS:  RECTAL POLYP  PROCEDURE: XI ROBOT ASSISTED PARTIAL PROCTECTOMY OF RECTAL MASS USING TAMIS  SURGEON:  Adin Hector, MD  ASSISTANT: Nadeen Landau, MD   ANESTHESIA:   local and general  EBL:  Total I/O In: 2100 [I.V.:1600; IV Piggyback:500] Out: 150 [Urine:100; Blood:50]  Delay start of Pharmacological VTE agent (>24hrs) due to surgical blood loss or risk of bleeding:  no  DRAINS: none   SPECIMEN:  Source of Specimen:  RECTAL POLYP (see OR findings)  DISPOSITION OF SPECIMEN:  PATHOLOGY  COUNTS:  YES  PLAN OF CARE: Admit for overnight observation  PATIENT DISPOSITION:  PACU - hemodynamically stable.  INDICATION: Pleasant patient with large distal rectal polyp.  Biopsy consistent with adenomatous tissue.  No hard evidence of cancer.  Transanal excision recommended.  The anatomy & physiology of the digestive tract was discussed.  The pathophysiology of the rectal pathology was discussed.  Natural history risks without surgery was discussed.   I feel the risks of no intervention will lead to serious problems that outweigh the operative risks; therefore, I recommended surgery.    Laparoscopic & open abdominal techniques were discussed.  I recommended we start with a partial proctectomy by transanal endoscopic microsurgery (TEM) for excisional biopsy to remove the pathology and hopefully cure and/or control the pathology.  This technique can offer less operative risk and faster post-operative recovery.  Possible need for immediate or later abdominal surgery for further treatment was  discussed.   Risks such as bleeding, abscess, reoperation, ostomy, heart attack, death, and other risks were discussed.   I noted a good likelihood this will help address the problem.  Goals of post-operative recovery were discussed as well.  We will work to minimize complications.  An educational handout was given as well.  Questions were answered.  The patient expresses understanding & wishes to proceed with surgery.  OR FINDINGS: Soft foot broad-based anterior rectal wall polyp going to the left lateral wall as well.  Distal in about 4 cm from the anal verge.  Proximally and over the rectal fold  The resulting mass was 8 x 8 cm in size  Pin placement on pathology specimen: Proximal margin: Purple Distal margin: Blue Left lateral: Yellow Right lateral:  Green  The closure rests 1-2 cm from the anal verge closest in the anterior location going from the left posterior lateral rectal wall anteriorly over to the right posterior lateral wall.  It is 66% of the circumference.  DESCRIPTION: Informed consent was confirmed.  Patient received general anesthesia without difficulty.  Foley catheter sterilely placed.  Sequential compression devices active during the entire case.  The patient was placed in the prone position, taking extra care to secure and protect the patient appropriately.  The perineum and perianal regions were prepped and draped in a sterile fashion.  Surgical timeout confirmed our plan.  I did a gentle digital rectal examination with gradual anal dilation to allow placement of the Applied TAMIS Gelpoint.  This was secured to the inner gluteus with interrupted silk sutures we placed 4 ports through this.  8 mm robotic x3, 5 mm assist port inferiorly.  We induced carbon dioxide insufflation intraluminally.  The robot was carefully docked  I could easily identify the mass.  I went ahead and used tip point cautery to mark 1 cm margins circumferentially.  I then did a full thickness  transection at the distal margin anterior midline at the anorectal margin.  I came around laterally.  Switched over to harmonic dissection.  Lifted the specimen off the pelvic canal for a good deep margin.  I gradually came more proximally.  The polyp came up to the mid rectum over the lowest rectal fold.  Itransected at the proximal margin.  I ensured hemostasis.  I inspected the main specimen and pinned it on thick cork board.  Pins as noted above.  I walked the specimen down to pathology and showed the Justine Dines specimen to Dr. Lyndon Code and his pathology team for proper orientation.    I went back in and scrubbed in.  Hemostasis was good.  I reapproximated the wound with a 3-0 V-lock horizontal Connel stitch to bring the right posteior corner of the rectal defect wound closed.  I ran that more towards the middle of the wound about a third of the way.  I then started another stitch in the left posterior lateral corner of the wound as well and ran that towards the midline, meeting up with the other suture.  This help close the massive rectal defect down well.  The resulting lumen was about 35 mm.  I did meticulous inspection with fine tip instruments to confirm good watertight closure   Hemostasis excellent.  The lumen was quite patent.  Carbon dioxide evacuated & instruments removed.  The patient has been extubated & stable in the recovery room.  I discussed operative findings, updated the patient's status, discussed probable steps to recovery, and gave postoperative recommendations to the patient's relative, [name].  Recommendations were made.  Questions were answered.  She expressed understanding & appreciation.   Adin Hector, MD, FACS, MASCRS Gastrointestinal and Minimally Invasive Surgery    [address] [place], [place] [address] [phone] Main / Paging [phone] Fax

## 2019-06-26 ENCOUNTER — Encounter (HOSPITAL_COMMUNITY): Payer: Self-pay | Admitting: Surgery

## 2019-06-26 DIAGNOSIS — D128 Benign neoplasm of rectum: Secondary | ICD-10-CM | POA: Diagnosis not present

## 2019-06-26 LAB — BASIC METABOLIC PANEL
Anion gap: 9 (ref 5–15)
BUN: 15 mg/dL (ref 8–23)
CO2: 24 mmol/L (ref 22–32)
Calcium: 9.2 mg/dL (ref 8.9–10.3)
Chloride: 106 mmol/L (ref 98–111)
Creatinine, Ser: 0.77 mg/dL (ref 0.61–1.24)
GFR calc Af Amer: >60 mL/min (ref >60)
GFR calc non Af Amer: >60 mL/min (ref >60)
Glucose, Bld: 132 mg/dL — ABNORMAL HIGH (ref 70–99)
Potassium: 3.9 mmol/L (ref 3.5–5.1)
Sodium: 139 mmol/L (ref 135–145)

## 2019-06-26 LAB — CBC
HCT: 35.4 % — ABNORMAL LOW (ref 39.0–52.0)
Hemoglobin: 12.1 g/dL — ABNORMAL LOW (ref 13.0–17.0)
MCH: 30.9 pg (ref 26.0–34.0)
MCHC: 34.2 g/dL (ref 30.0–36.0)
MCV: 90.3 fL (ref 80.0–100.0)
Platelets: 135 10*3/uL — ABNORMAL LOW (ref 150–400)
RBC: 3.92 MIL/uL — ABNORMAL LOW (ref 4.22–5.81)
RDW: 15.5 % (ref 11.5–15.5)
WBC: 12 10*3/uL — ABNORMAL HIGH (ref 4.0–10.5)
nRBC: 0 % (ref 0.0–0.2)

## 2019-06-26 LAB — SURGICAL PATHOLOGY

## 2019-06-26 LAB — MAGNESIUM: Magnesium: 2.1 mg/dL (ref 1.7–2.4)

## 2019-06-26 MED ORDER — ENSURE ENLIVE PO LIQD: 237.0000 mL | Freq: Two times a day (BID) | ORAL | Status: DC

## 2019-06-26 NOTE — Discharge Summary (Signed)
Physician Discharge Summary    Patient ID: Roberto Collins MRN: [id number] DOB/AGE: Apr 03, 1951  68 y.o.  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  Postoperatively, the patient gradually mobilized and advanced to a solid diet.  Pain and other symptoms were treated aggressively.    By the time of discharge, the patient was walking well the hallways, eating food, having flatus.  Pain was well-controlled on an oral medications.  Based on meeting discharge criteria and continuing to recover, I felt it was safe for the patient to be discharged from the hospital to further recover with close followup. Postoperative recommendations were discussed in detail.  They are written as well.  Discharged Condition: good  Discharge Exam: Blood pressure 120/65, pulse 91, temperature 99.2 F (37.3 C), temperature source Oral, resp. rate 18, height 5\' 11"  (1.803 m), weight 59.1 kg, SpO2 95 %.  General: Pt awake/alert/oriented x4 in No acute distress Eyes: PERRL, normal EOM.  Sclera clear.  No icterus Neuro: CN II-XII intact w/o  focal sensory/motor deficits. Lymph: No head/neck/groin lymphadenopathy Psych:  No delerium/psychosis/paranoia HENT: Normocephalic, Mucus membranes moist.  No thrush Neck: Supple, No tracheal deviation Chest: No chest wall pain w good excursion CV:  Pulses intact.  Regular rhythm MS: Normal AROM mjr joints.  No obvious deformity Abdomen: Soft.  Nondistended.  Nontender.  No evidence of peritonitis.  No incarcerated hernias. GU: No hernia Rectal - gauze in place Ext:  SCDs BLE.  No mjr edema.  No cyanosis Skin: No petechiae / purpura   Disposition:   Follow-up Information    [name], MD. Schedule an appointment as soon as possible for a visit in 3 weeks.   Specialty: General Surgery Why: To follow up after your operation, To follow up after your hospital stay Contact information: [place] [address] [phone]           Discharge disposition: 01-Home or Self Care       Discharge Instructions    Call MD for:  hives   Complete by: As directed    Call MD for:  persistant dizziness or light-headedness   Complete by: As directed    Call MD for:  persistant nausea and vomiting   Complete by: As directed    Call MD for:  redness, tenderness, or signs of infection (pain, swelling, redness, odor or green/yellow discharge around incision site)   Complete by: As directed    You will often notice bleeding with bowel movements.  Expect some yellow or tan drainage.  This can occur for weeks, but should be mild by the end of the first week of surgery.  Wear  an absorbent pad or soft cotton gauze in your underwear until the drainage stops   Call MD for:  severe uncontrolled pain   Complete by: As directed    Diet - low sodium heart healthy   Complete by: As directed    Discharge instructions   Complete by: As directed    See Rectal Surgery instruction sheet   Discharge wound care:   Complete by: As directed    -Allow any ribbon or fluffed  gauze to fall out with the 1st bowel movement -Bathe / shower every day.  Just warm water.  Avoid salts/soaps.  Keep the area clean by showering / bathing over the incision / wound.   It is okay to soak an open wound to help wash it.   -Wet wipes or showers / gentle washing after bowel movements is often less traumatic than regular toilet paper.  Consider using a squeeze bottle of warm water to rinse the perianal region. -You will notice bleeding & yellow drainage with bowel movements.  This should slow down by the end of the first week of surgery, but expect some drainage for many weeks.  Wear an absorbent pad or soft cotton gauze in your underwear as needed to catch any drainage and help keep the area clean and dry.   Driving Restrictions   Complete by: As directed    You may drive when you are no longer taking prescription pain medication, you can comfortably sit for long periods of time, and you can safely maneuver your car and apply brakes.   Increase activity slowly   Complete by: As directed    Lifting restrictions   Complete by: As directed    You may resume regular (light) daily activities beginning the next day-such as daily self-care, walking, climbing stairs-gradually increasing activities as tolerated.  If you can walk 30 minutes without difficulty, it is safe to try more intense activity such as jogging, treadmill, bicycling, low-impact aerobics, swimming, etc. Save the most intensive and strenuous activity for last such as sit-ups, heavy lifting, contact sports, etc  Refrain from any heavy lifting or straining until you are off narcotics for pain control.  Remember: if it hurts to do it, don't do it: STOP   May shower / Bathe   Complete by: As directed    Warm water sitz baths/tub soaks x 20-30 minutes, 4-8 times a day for comfort   May walk up steps   Complete by: As directed    Sexual Activity Restrictions   Complete by: As directed    You may have sexual intercourse when it is  comfortable. If it hurts to do it, STOP.      Allergies as of 06/26/2019      Reactions   Bee Venom Anaphylaxis   Penicillins Anaphylaxis, Swelling   Did it involve swelling of the face/tongue/throat, SOB, or low BP? Yes Did it involve sudden or severe rash/hives, skin peeling, or any reaction on the inside of your mouth or nose? No Did you need to seek medical attention at a hospital or doctor's office? Yes When did it last happen?Many years ago If all above answers are "NO", may proceed with cephalosporin use.      Medication List    TAKE these medications   albuterol 108 (90 Base) MCG/ACT inhaler Commonly known as: VENTOLIN HFA Inhale 2 puffs into the lungs every 4 (four) hours as needed for wheezing or shortness of breath.   chlorproMAZINE 100 MG tablet Commonly  known as: THORAZINE Take 300 mg by mouth at bedtime.   cyclobenzaprine 10 MG tablet Commonly known as: FLEXERIL Take 10 mg by mouth at bedtime.   folic acid 1 MG tablet Commonly known as: FOLVITE Take 1 tablet (1 mg total) by mouth daily.   gabapentin 600 MG tablet Commonly known as: NEURONTIN Take 1,800 mg by mouth at bedtime.   lidocaine 5 % Commonly known as: LIDODERM Place 1 patch onto the skin daily as needed (pain). Remove & Discard patch within 12 hours or as directed by MD   morphine 60 MG 12 hr tablet Commonly known as: MS CONTIN Take 60 mg by mouth every 12 (twelve) hours.   multivitamin with minerals Tabs tablet Take 1 tablet by mouth daily.   oxyCODONE 5 MG immediate release tablet Commonly known as: Oxy IR/ROXICODONE Take 1-2 tablets (5-10 mg total) by mouth every 6 (six) hours as needed for moderate pain, severe pain or breakthrough pain.   oxyCODONE-acetaminophen 5-325 MG tablet Commonly known as: PERCOCET/ROXICET Take 1 tablet by mouth See admin instructions. Take 1 tablet every morning. Take 1 tablet later in the day as needed for pain.   Vitamin D 50 MCG (2000 UT) Caps Take  2,000 Units by mouth daily.            Discharge Care Instructions  (From admission, onward)         Start     Ordered   06/25/19 0000  Discharge wound care:    Comments: -Allow any ribbon or fluffed gauze to fall out with the 1st bowel movement -Bathe / shower every day.  Just warm water.  Avoid salts/soaps.  Keep the area clean by showering / bathing over the incision / wound.   It is okay to soak an open wound to help wash it.   -Wet wipes or showers / gentle washing after bowel movements is often less traumatic than regular toilet paper.  Consider using a squeeze bottle of warm water to rinse the perianal region. -You will notice bleeding & yellow drainage with bowel movements.  This should slow down by the end of the first week of surgery, but expect some drainage for many weeks.  Wear an absorbent pad or soft cotton gauze in your underwear as needed to catch any drainage and help keep the area clean and dry.   06/25/19 1041          Significant Diagnostic Studies:  Results for orders placed or performed during the hospital encounter of 06/25/19 (from the past 72 hour(s))  Basic metabolic panel     Status: Abnormal   Collection Time: 06/26/19  3:40 AM  Result Value Ref Range   Sodium 139 135 - 145 mmol/L   Potassium 3.9 3.5 - 5.1 mmol/L   Chloride 106 98 - 111 mmol/L   CO2 24 22 - 32 mmol/L   Glucose, Bld 132 (H) 70 - 99 mg/dL   BUN 15 8 - 23 mg/dL   Creatinine, Ser 0.77 0.61 - 1.24 mg/dL   Calcium 9.2 8.9 - 10.3 mg/dL   GFR calc non Af Amer >60 >60 mL/min   GFR calc Af Amer >60 >60 mL/min   Anion gap 9 5 - 15    Comment: Performed at [facility], [place] [address], [place], [place] [address]  CBC     Status: Abnormal   Collection Time: 06/26/19  3:40 AM  Result Value Ref Range   WBC 12.0 (H) 4.0 -  10.5 K/uL   RBC 3.92 (L) 4.22 - 5.81 MIL/uL   Hemoglobin 12.1 (L) 13.0 - 17.0 g/dL   HCT 35.4 (L) 39.0 - 52.0 %   MCV 90.3 80.0 - 100.0 fL   MCH 30.9  26.0 - 34.0 pg   MCHC 34.2 30.0 - 36.0 g/dL   RDW 15.5 11.5 - 15.5 %   Platelets 135 (L) 150 - 400 K/uL   nRBC 0.0 0.0 - 0.2 %    Comment: Performed at [facility], [place] [address], [place], [place] [address]  Magnesium     Status: None   Collection Time: 06/26/19  3:40 AM  Result Value Ref Range   Magnesium 2.1 1.7 - 2.4 mg/dL    Comment: Performed at [facility], [place] [address] [place], [place] [address]    No results found.  Past Medical History:  Diagnosis Date  . Anemia   . Asthma   . Blood transfusion without reported diagnosis     Past Surgical History:  Procedure Laterality Date  . BACK SURGERY  2000  . COLONOSCOPY WITH PROPOFOL N/A 04/02/2019   Procedure: COLONOSCOPY WITH PROPOFOL;  Surgeon: Virgel Manifold, MD;  Location: [facility] ENDOSCOPY;  Service: Endoscopy;  Laterality: N/A;  . ESOPHAGOGASTRODUODENOSCOPY Left 03/31/2019   Procedure: ESOPHAGOGASTRODUODENOSCOPY (EGD);  Surgeon: Virgel Manifold, MD;  Location: [facility] ENDOSCOPY;  Service: Endoscopy;  Laterality: Left;  . GIVENS CAPSULE STUDY N/A 04/02/2019   Procedure: GIVENS CAPSULE STUDY;  Surgeon: Virgel Manifold, MD;  Location: [facility] ENDOSCOPY;  Service: Endoscopy;  Laterality: N/A;  . XI ROBOT ASSISTED TRANSANAL RESECTION N/A 06/25/2019   Procedure: XI ROBOT ASSISTED PARTIAL PROCTECTOMY OF RECTAL MASS USING TAMIS;  Surgeon: [name], MD;  Location: [facility] ORS;  Service: General;  Laterality: N/A;    Social History   Socioeconomic History  . Marital status: Single    Spouse name: Not on file  . Number of children: Not on file  . Years of education: Not on file  . Highest education level: Not on file  Occupational History  . Occupation: disabled  Social Needs  . Financial resource strain: Not very hard  . Food insecurity    Worry: Never true    Inability: Never true  . Transportation needs    Medical: No    Non-medical: No  Tobacco Use  . Smoking status:  Current Some Day Smoker    Packs/day: 0.50    Years: 10.00    Pack years: 5.00    Types: Cigarettes  . Smokeless tobacco: Never Used  Substance and Sexual Activity  . Alcohol use: Never    Frequency: Never  . Drug use: Never  . Sexual activity: Not on file  Lifestyle  . Physical activity    Days per week: 0 days    Minutes per session: 0 min  . Stress: Very much  Relationships  . Social Herbalist on phone: Once a week    Gets together: Once a week    Attends religious service: Never    Active member of club or organization: No    Attends meetings of clubs or organizations: Never    Relationship status: Not on file  . Intimate partner violence    Fear of current or ex partner: No    Emotionally abused: No    Physically abused: No    Forced sexual activity: No  Other Topics Concern  . Not on file  Social History Narrative  Self; ; used to roofing/ disabled; no children; smoke 1/2 ppd; currently quit.       Brother- 2 years younger; mom-alive.     Family History  Problem Relation Age of Onset  . Liver cancer Father     Current Facility-Administered Medications  Medication Dose Route Frequency Provider Last Rate Last Dose  . 0.9 %  sodium chloride infusion  250 mL Intravenous PRN [name], MD      . 0.9 %  sodium chloride infusion   Intravenous Q8H PRN [name], MD      . acetaminophen (TYLENOL) tablet 1,000 mg  1,000 mg Oral Lajuana Ripple, MD   1,000 mg at 06/26/19 [phone]  . albuterol (PROVENTIL) (2.5 MG/3ML) 0.083% nebulizer solution 3 mL  3 mL Inhalation Q4H PRN [name], MD      . alum & mag hydroxide-simeth (MAALOX/MYLANTA) 200-200-20 MG/5ML suspension 30 mL  30 mL Oral Q6H PRN [name], MD   30 mL at 06/25/19 2135  . alvimopan (ENTEREG) capsule 12 mg  12 mg Oral BID [name], MD      . Chlorhexidine Gluconate Cloth 2 % PADS 6 each  6 each Topical Daily [name], MD   6 each at 06/25/19 2135  . chlorproMAZINE  (THORAZINE) tablet 300 mg  300 mg Oral Ardeen Fillers, MD   300 mg at 06/25/19 2129  . cholecalciferol (VITAMIN D) tablet 2,000 Units  2,000 Units Oral Daily [name], MD   2,000 Units at 06/25/19 1414  . cyclobenzaprine (FLEXERIL) tablet 10 mg  10 mg Oral Ardeen Fillers, MD   10 mg at 06/25/19 2129  . diphenhydrAMINE (BENADRYL) 12.5 MG/5ML elixir 12.5 mg  12.5 mg Oral Q6H PRN [name], MD       Or  . diphenhydrAMINE (BENADRYL) injection 12.5 mg  12.5 mg Intravenous Q6H PRN [name], MD      . enoxaparin (LOVENOX) injection 40 mg  40 mg Subcutaneous Q24H [name], MD      . feeding supplement (ENSURE SURGERY) liquid 237 mL  237 mL Oral BID BM [name], MD   237 mL at 06/25/19 1414  . folic acid (FOLVITE) tablet 1 mg  1 mg Oral Daily [name], MD   1 mg at 06/25/19 1415  . gabapentin (NEURONTIN) capsule 1,800 mg  1,800 mg Oral Ardeen Fillers, MD   1,800 mg at 06/25/19 2129  . hydrocortisone-pramoxine [facility]) 2.5-1 % rectal cream 1 application  1 application Rectal [id number] PRN [name], MD      . HYDROmorphone (DILAUDID) injection 0.5-2 mg  0.5-2 mg Intravenous Q4H PRN [name], MD      . lidocaine (LIDODERM) 5 % 1 patch  1 patch Transdermal Daily PRN [name], MD      . lip balm (CARMEX) ointment 1 application  1 application Topical BID [name], MD   1 application at 0000000 2129  . magic mouthwash  15 mL Oral QID PRN [name], MD      . metoprolol tartrate (LOPRESSOR) injection 5 mg  5 mg Intravenous Q6H PRN [name], MD      . morphine (MS CONTIN) 12 hr tablet 60 mg  60 mg Oral Gorden Harms, MD   60 mg at 06/25/19 2129  . multivitamin with minerals tablet 1 tablet  1 tablet Oral Daily [name], MD   1 tablet at 06/25/19 1414  . ondansetron (ZOFRAN) tablet 4 mg  4  mg Oral Q6H PRN [name], MD       Or  . ondansetron [facility]) injection 4 mg  4 mg Intravenous Q6H PRN [name], MD      . prochlorperazine  (COMPAZINE) tablet 10 mg  10 mg Oral Q6H PRN [name], MD       Or  . prochlorperazine (COMPAZINE) injection 5-10 mg  5-10 mg Intravenous Q6H PRN [name], MD      . saccharomyces boulardii (FLORASTOR) capsule 250 mg  250 mg Oral BID [name], MD   250 mg at 06/25/19 2129  . sodium chloride flush (NS) 0.9 % injection 3 mL  3 mL Intravenous Gorden Harms, MD      . sodium chloride flush (NS) 0.9 % injection 3 mL  3 mL Intravenous PRN [name], MD      . traMADol Veatrice Bourbon) tablet 50-100 mg  50-100 mg Oral Q6H PRN [name], MD      . witch hazel-glycerin (TUCKS) pad 1 application  1 application Topical PRN [name], MD         Allergies  Allergen Reactions  . Bee Venom Anaphylaxis  . Penicillins Anaphylaxis and Swelling    Did it involve swelling of the face/tongue/throat, SOB, or low BP? Yes Did it involve sudden or severe rash/hives, skin peeling, or any reaction on the inside of your mouth or nose? No Did you need to seek medical attention at a hospital or doctor's office? Yes When did it last happen?Many years ago If all above answers are "NO", may proceed with cephalosporin use.     Signed: Morton Peters, MD, FACS, MASCRS Gastrointestinal and Minimally Invasive Surgery    [address], [place], [place] [address] [phone] Main / Paging [phone] Fax   06/26/2019, 7:32 AM

## 2019-06-26 NOTE — Discharge Instructions (Signed)
ANORECTAL SURGERY:  POST OPERATIVE INSTRUCTIONS  ######################################################################  EAT Start with a pureed / full liquid diet After 24 hours, gradually transition to a high fiber diet.    CONTROL PAIN Control pain so you can tolerate bowel movements,  walk, sleep, tolerate sneezing/coughing, and go up/down stairs.   HAVE A BOWEL MOVEMENT DAILY Keep your bowels regular to avoid problems.   Taking a fiber supplement every day to keep bowels soft.   Try a laxative to override constipation. Use an antidairrheal to slow down diarrhea.   Call if not better after 2 tries  WALK Walk an hour a day.  Control your pain to do that.   CALL IF YOU HAVE PROBLEMS/CONCERNS Call if you are still struggling despite following these instructions. Call if you have concerns not answered by these instructions  ######################################################################    1. Take your usually prescribed home medications unless otherwise directed.  2. DIET: Follow a light bland diet & liquids the first 24 hours after arrival home, such as soup, liquids, starches, etc.  Be sure to drink plenty of fluids.  Quickly advance to a usual solid diet within a few days.  Avoid fast food or heavy meals as your are more likely to get nauseated or have irregular bowels.  A low-fat, high-fiber diet for the rest of your life is ideal.  3. PAIN CONTROL: a. Pain is best controlled by a usual combination of three different methods TOGETHER: i. Ice/Heat ii. Over the counter pain medication iii. Prescription pain medication b. Expect swelling and discomfort in the anus/rectal area.  Warm water baths (30-60 minutes up to 6 times a day, especially after bowel meovements) will help. Use ice for the first few days to help decrease swelling and bruising, then switch to heat such as warm towels, sitz baths, warm baths, etc to help relax tight/sore spots and speed recovery.   Some people prefer to use ice alone, heat alone, alternating between ice & heat.  Experiment to what works for you.   c. It is helpful to take an over-the-counter pain medication continuously for the first few weeks.  Choose one of the following that works best for you: i. Naproxen (Aleve, etc)  Two 235m tabs twice a day ii. Ibuprofen (Advil, etc) Three 2031mtabs four times a day (every meal & bedtime) iii. Acetaminophen (Tylenol, etc) 500-65083mour times a day (every meal & bedtime) d. A  prescription for pain medication (such as oxycodone, hydrocodone, etc) should be given to you upon discharge.  Take your pain medication as prescribed.  i. If you are having problems/concerns with the prescription medicine (does not control pain, nausea, vomiting, rash, itching, etc), please call us [phone] see if we need to switch you to a different pain medicine that will work better for you and/or control your side effect better. ii. If you need a refill on your pain medication, please contact your pharmacy.  They will contact our office to request authorization. Prescriptions will not be filled after 5 pm or on week-ends.  If can take up to 48 hours for it to be filled & ready so avoid waiting until you are down to thel ast pill. e. A topical cream (Dibucaine) or a prescription for a cream (such as diltiazem 2% gel) may be given to you.  Many people find relief with topical creams.  Some people find it burns too much.  Experiment.  If it helps, use it.  If it burns, don't using  it.  Use a Sitz Bath 4-8 times a day for relief   [employer] A sitz bath is a warm water bath taken in the sitting position that covers only the hips and buttocks. It may be used for either healing or hygiene purposes. Sitz baths are also used to relieve pain, itching, or muscle spasms. The water may contain medicine. Moist heat will help you heal and relax.  HOME CARE INSTRUCTIONS  Take 3 to 4 sitz baths a day. 1. Fill the  bathtub half full with warm water. 2. Sit in the water and open the drain a little. 3. Turn on the warm water to keep the tub half full. Keep the water running constantly. 4. Soak in the water for 15 to 20 minutes. 5. After the sitz bath, pat the affected area dry first.   4. KEEP YOUR BOWELS REGULAR a. The goal is one soft bowel movement a day b. Avoid getting constipated.  Between the surgery and the pain medications, it is common to experience some constipation.  Increasing fluid intake and taking a fiber supplement (such as Metamucil, Citrucel, FiberCon, MiraLax, etc) 2-3 times a day regularly will usually help prevent this problem from occurring.  A mild laxative (prune juice, Milk of Magnesia, MiraLax, etc) should be taken according to package directions if there are no bowel movements after 48 hours. c. Watch out for diarrhea.  If you have many loose bowel movements, simplify your diet to bland foods & liquids for a few days.  Stop any stool softeners and decrease your fiber supplement.  Switching to mild anti-diarrheal medications (Kayopectate, Pepto Bismol) can help.  Can try an imodium/loperamide dose.  If this worsens or does not improve, please call us.  5. Wound Care  a. Remove your bandages with your first bowel movement, usually the day after surgery.  You may have packing if you had an abscess.  Let any packing or gauze fall come out.   b. Wear an absorbent pad or soft cotton balls in your underwear as needed to catch any drainage and help keep the area  c. Keep the area clean and dry.  Bathe / shower every day.  Keep the area clean by showering / bathing over the incision / wound.   It is okay to soak an open wound to help wash it.  Consider using a squeeze bottle filled with warm water to gently wash the anal area.  Wet wipes or showers / gentle washing after bowel movements is often less traumatic than regular toilet paper. d. Dennis Bast will often notice bleeding with bowel movements.   This should slow down by the end of the first week of surgery.  Sitting on an ice pack can help. e. Expect some drainage.  This should slow down by the end of the first week of surgery, but you will have occasional bleeding or drainage up to a few months after surgery.  Wear an absorbent pad or soft cotton gauze in your underwear until the drainage stops.  6. ACTIVITIES as tolerated:   a. You may resume regular (light) daily activities beginning the next day--such as daily self-care, walking, climbing stairs--gradually increasing activities as tolerated.  If you can walk 30 minutes without difficulty, it is safe to try more intense activity such as jogging, treadmill, bicycling, low-impact aerobics, swimming, etc. b. Save the most intensive and strenuous activity for last such as sit-ups, heavy lifting, contact sports, etc  Refrain from any heavy lifting or straining  until you are off narcotics for pain control.   c. DO NOT PUSH THROUGH PAIN.  Let pain be your guide: If it hurts to do something, don't do it.  Pain is your body warning you to avoid that activity for another week until the pain goes down. d. You may drive when you are no longer taking prescription pain medication, you can comfortably sit for long periods of time, and you can safely maneuver your car and apply brakes. e. Dennis Bast may have sexual intercourse when it is comfortable.  7. FOLLOW UP in our office a. Please call [facility] at [phone] to set up an appointment to see your surgeon in the office for a follow-up appointment approximately 2-3 weeks after your surgery. b. Make sure that you call for this appointment the day you arrive home to ensure a convenient appointment time.  8. IF YOU HAVE DISABILITY OR FAMILY LEAVE FORMS, BRING THEM TO THE OFFICE FOR PROCESSING.  DO NOT GIVE THEM TO YOUR DOCTOR.        WHEN TO CALL us [phone]: 1. Poor pain control 2. Reactions / problems with new medications (rash/itching, nausea,  etc)  3. Fever over 101.5 F (38.5 C) 4. Inability to urinate 5. Nausea and/or vomiting 6. Worsening swelling or bruising 7. Continued bleeding from incision. 8. Increased pain, redness, or drainage from the incision  The clinic staff is available to answer your questions during regular business hours (8:30am-5pm).  Please dont hesitate to call and ask to speak to one of our nurses for clinical concerns.   A surgeon from [facility] is always on call at the hospitals   If you have a medical emergency, go to the nearest emergency room or call 911.    [facility], [place], [place], [place], [place]  [address] ? MAIN: [phone] ? TOLL FREE: [phone] ? FAX [phone] [email or web address]   TRANSANAL ENDOSCOPIC MICROSURGERY (TEM)  Transanal endoscopic microsurgery (TEM) was developed as a means to provide a less invasive way to operate on the rectum.  This may needed to provide a good resection of part of the rectal wall to remove a large pre-cancerous polyp or an early cancer in which the patient cannot tolerate an open surgery or has an extremely hostile abdomen that makes the resection very risky.    The rectum is the last foot of the tubular digestive tract.  It resides in the pelvis, laying on top of your tailbone.  It is the final reservoir for stool before it is evacuated through the anus in the process of defecation, naturally stretching and contracting to allow time for someone to control bowel function.  The rectum is a common location for polyps or even a cancer.    In most cases when a polyp is found in the colon or rectum, a person often does not need to have a large resection of the rectum, but have it excised by a colonoscope.  However, some polyps are too large to be safely removed through endoscopy and require surgery.  Classically, this is done through an open incision where a low anterior resection or  abdominoperineal resection where part or the entire rectum is removed.  Often, only part of a wall of the rectum needs to be removed.   Transanal endoscopic microsurgery (TEM) was developed as a means to provide a less invasive way to operate on the rectum.    Transanal endoscopic microsurgery (TEM) involves the  patient to be placed under complete general anesthesia.  The anus is gently dilated and a metal tube is placed into the rectum.  Through the tube, absorbable gas is infused and long instruments are used to help access and cut out the polyp or tumor from part of the rectum.  The long instruments are also used to help sew the wound shut.  The specimen is then sent for pathology.  The procedure itself usually takes a few hours of time.  The patient stays at least overnight.  When they can tolerate a regular diet and have adequate pain control they usually leave the hospital in one or two days.    The advantage of TEM is that as opposed to a long hospital stay, patient recovery is much faster.  People  less likely to have bowel or other problems.  Careful pre-operative selection is essential to make sure that the patient is an appropriate candidate for the surgery.  Tumors or cancers that are very large or invasive usually are much more difficult to remove by this technique and are not considered the first option.  Persons who are most appropriate for this surgery are those with large polyps that have not become cancers or early cancers in patients who have high risks with larger surgery.   Sometimes a more aggressive laparoscopic or open follow up resection is needed if a cancer is found to give the best chance at pure.    In general, surgery has a better chance at cure if a cancer is found but may not needed if it is only a precancerous polyp.  Risks to the surgery such as pain, bleeding, abscess, leak, ostomy, and death are inherent but overall the TEM procedure is less stressful and less risky to the  patient than a classic colorectal resection throughthe abdomen.  Your colorectal surgeon can help decide what option is best for you.   Colon Polyps  Polyps are tissue growths inside the body. Polyps can grow in many places, including the large intestine (colon). A polyp may be a round bump or a mushroom-shaped growth. You could have one polyp or several. Most colon polyps are noncancerous (benign). However, some colon polyps can become cancerous over time. Finding and removing the polyps early can help prevent this. What are the causes? The exact cause of colon polyps is not known. What increases the risk? You are more likely to develop this condition if you:  Have a family history of colon cancer or colon polyps.  Are older than 62 or older than 45 if you are African American.  Have inflammatory bowel disease, such as ulcerative colitis or Crohn's disease.  Have certain hereditary conditions, such as: ? Familial adenomatous polyposis. ? Lynch syndrome. ? Turcot syndrome. ? Peutz-Jeghers syndrome.  Are overweight.  Smoke cigarettes.  Do not get enough exercise.  Drink too much alcohol.  Eat a diet that is high in fat and red meat and low in fiber.  Had childhood cancer that was treated with abdominal radiation. What are the signs or symptoms? Most polyps do not cause symptoms. If you have symptoms, they may include:  Blood coming from your rectum when having a bowel movement.  Blood in your stool. The stool may look dark red or black.  Abdominal pain.  A change in bowel habits, such as constipation or diarrhea. How is this diagnosed? This condition is diagnosed with a colonoscopy. This is a procedure in which a lighted, flexible scope is inserted  into the anus and then passed into the colon to examine the area. Polyps are sometimes found when a colonoscopy is done as part of routine cancer screening tests. How is this treated? Treatment for this condition involves  removing any polyps that are found. Most polyps can be removed during a colonoscopy. Those polyps will then be tested for cancer. Additional treatment may be needed depending on the results of testing. Follow these instructions at home: Lifestyle  Maintain a healthy weight, or lose weight if recommended by your health care provider.  Exercise every day or as told by your health care provider.  Do not use any products that contain nicotine or tobacco, such as cigarettes and e-cigarettes. If you need help quitting, ask your health care provider.  If you drink alcohol, limit how much you have: ? 0-1 drink a day for women. ? 0-2 drinks a day for men.  Be aware of how much alcohol is in your drink. In the [place] one drink equals one 12 oz bottle of beer (355 mL), one 5 oz glass of wine (148 mL), or one 1 oz shot of hard liquor (44 mL). Eating and drinking   Eat foods that are high in fiber, such as fruits, vegetables, and whole grains.  Eat foods that are high in calcium and vitamin D, such as milk, cheese, yogurt, eggs, liver, fish, and broccoli.  Limit foods that are high in fat, such as fried foods and desserts.  Limit the amount of red meat and processed meat you eat, such as hot dogs, sausage, bacon, and lunch meats. General instructions  Keep all follow-up visits as told by your health care provider. This is important. ? This includes having regularly scheduled colonoscopies. ? Talk to your health care provider about when you need a colonoscopy. Contact a health care provider if:  You have new or worsening bleeding during a bowel movement.  You have new or increased blood in your stool.  You have a change in bowel habits.  You lose weight for no known reason. Summary  Polyps are tissue growths inside the body. Polyps can grow in many places, including the colon.  Most colon polyps are noncancerous (benign), but some can become cancerous over time.  This condition is  diagnosed with a colonoscopy.  Treatment for this condition involves removing any polyps that are found. Most polyps can be removed during a colonoscopy. This information is not intended to replace advice given to you by your health care provider. Make sure you discuss any questions you have with your health care provider. Document Released: 06/13/2004 Document Revised: 01/02/2018 Document Reviewed: 01/02/2018 Elsevier Patient Education  [place] 1 Nutrition Therapy The purpose of this diet is to provide foods that can be successfully and safely swallowed.  This diet consists of foods that are easy to swallow because they are pureed smooth and lump-free, not firm or sticky.  Puree all food you eat pureed to make foods easier to swallow. Once pureed, foods will be one consistent texture. ] Pureed foods fall off spoon as an intact spoonful, and hold shape on a plate.  To keep pureed foods smooth and lump-free, add small amounts of gravy, sauce, vegetable juice or cooking water, fruit juice, milk, or half and half while pureeing. This will prevent lumps from forming. Large chunks, hard foods, or lumps found in pureed foods that need to be chewed should be avoided. Avoid very hard, sticky, or crunchy foods.  See Foods Not Recommended list for examples. Prepare quantities of favorite food items and freeze them in portion sizes for use later.  Foods Recommended Grains Pureed soft cooked hot cereals with no lumps.  Soft breads, rolls, pastries, pancakes, [place] toast, muffins, donuts and bread dressing that have been pureed. Pregelled, soaked bread, cakes, cookies, and other grains that are consistently moist throughout without hard parts formed during sitting Pureed, moist pasta, potatoes and rice without lumps.  Protein Foods Pureed prepared, moistened, tender fresh or frozen red meat, including beef, pork, or lamb. Pureed prepared, moistened fresh or frozen  tender poultry, including skinless chicken or [place]. Puree prepared, moistened fresh, frozen, or canned tender seafood, including fish (salmon, herring, and sardines), shrimp, lobster, clams, and scallops. Pureed eggs and egg substitutes. Puree, smooth casseroles; moist with incorporated sauces/gravies. Puree prepared, moistened soy foods, such as tofu or tempeh. Puree prepared, moistened meat alternatives, such as veggie burgers, and sausages based on plant protein. Puree prepared, moistened legumes, such as dried beans, lentils, or peas. Dairy Smooth yogurt (without nuts or coconut), pureed yogurt and pureed cottage cheese. Whipped cream cheese, sour cream, and whipped topping  Frozen desserts such as pudding, custard, ice cream, sherbet, malts, and frozen yogurt Milk, fortified soymilk, fortified nut milk  Vegetables Pureed canned or cooked frozen, tender vegetables including dark-green, red and orange vegetables, legumes (beans and peas), and starchy vegetables. Smooth tomato sauce without seeds. Mashed potatoes and whipped sweet potatoes without skin; pureed potato recipes. Vegetable juices  Fruits Pureed canned and cooked fruits, drained of excess juices; puree fresh fruit if smooth and lump free  100% fruit juice Beverages Extremely thick, smooth, non-pouring sauce or gravy. Coffee, tea, water, 100% fruit juice Other Pureed prepared foods, including all soups with tender meats, casseroles, baked goods, and snacks made from recommended ingredients that are smooth and lump free. All seasonings and sweeteners, including honey  Foods Not Recommended Grains Any item that is not pureed: or has lumps. Dry cereal, cooked cereal with lumps, cereal with seeds. Grainy, sticky, or glutinous rice. Rice that separates into individual grains when cooked or served. Protein Foods Protein foods not pureed into smooth, lump free items. Chunky and smooth nut seed butters, unless used in a  tolerated recipe. Whole nuts and seeds, such as peanuts and almonds; pistachios and sunflower seeds. Protein foods served with undrained thin liquids. Dairy Yogurt with lumps, seeds, fruit pieces, nuts or coconut. Cheeses unless pureed into allowed recipe. Vegetables All raw vegetables. Stir-fried or fried vegetables that do not puree into smooth, lump free product. Fruits All non-pureed fresh, frozen or canned fruits; seeds and skins. Stringy, high-pulp fruits such as papaya, pineapple, or mango that do not puree into smooth, lump free product. Uncooked dried fruits such as raisins, prunes or apricots. Fruit leather, fruit roll-ups, fruit snacks, dried fruits. Other Foods that are not pureed. Pureed foods with lumps that are not strained or removed. Jelly

## 2019-06-26 NOTE — Progress Notes (Signed)
Discharge instructions given to pt and all questions were answered.  

## 2019-06-26 NOTE — Progress Notes (Signed)
Initial Nutrition Assessment  RD working remotely.   DOCUMENTATION CODES:   Underweight  INTERVENTION:  - will order Ensure Enlive BID for at home, each supplement provides 350 kcal and 20 grams of protein. - will put diet education handouts in Discharge Instructions.  - diet advancement outpatient per Surgeon.    NUTRITION DIAGNOSIS:   Increased nutrient needs related to acute illness, post-op healing as evidenced by estimated needs.  GOAL:   Patient will meet greater than or equal to 90% of their needs  MONITOR:   PO intake, Supplement acceptance, Labs, Weight trends  REASON FOR ASSESSMENT:   Malnutrition Screening Tool  ASSESSMENT:   69 year-old male with medical history including severe symptomatic anemia which required admission for transfusion. 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 No metastatic disease noted on CT. On 9/24 he underwent robotic TAMIS with partial proctectomy.  Patient is being discharged home this afternoon. Following surgery yesterday, diet advanced to Dysphagia 1, thin liquids and patient was able to eat 50% of breakfast this AM. Ensure Surgery was ordered BID yesterday afternoon and patient has accepted 1 of the 2 bottles offered to him.   Per chart review, current weight is 130 lb and weight on 04/10/19 was 124 lb indicating 6 lb weight gain in the past 2.5 months.    Labs reviewed. Medications reviewed; 2000 units vitamin D/day, 1 mg folvite/day, daily multivitamin with minerals, 250 mg florastor BID.    NUTRITION - FOCUSED PHYSICAL EXAM:  unable to complete at this time.   Diet Order:   Diet Order            DIET - DYS 1 Room service appropriate? Yes; Fluid consistency: Thin  Diet effective now        Diet - low sodium heart healthy              EDUCATION NEEDS:   Education needs have been addressed  Skin:  Skin Assessment: Skin Integrity Issues: Skin  Integrity Issues:: Incisions Incisions: abdomen (9/24)  Last BM:  9/25  Height:   Ht Readings from Last 1 Encounters:  06/25/19 5\' 11"  (1.803 m)    Weight:   Wt Readings from Last 1 Encounters:  06/26/19 59.1 kg    Ideal Body Weight:  78.2 kg  BMI:  Body mass index is 18.17 kg/m.  Estimated Nutritional Needs:   Kcal:  1950-2150 kcal  Protein:  95-110 grams  Fluid:  >/= 2 L/day     Jarome Matin, MS, RD, LDN, [facility] Inpatient Clinical Dietitian Pager # [phone] After hours/weekend pager # [phone]

## 2019-07-09 ENCOUNTER — Telehealth: Payer: Self-pay

## 2019-07-09 NOTE — Telephone Encounter (Signed)
Tati, can you try and contact this pt again to schedule a follow up with Dr. Bonna Gains, please? Looks like you tried contacting him in July to schedule a follow up. Thank you!

## 2019-07-09 NOTE — Telephone Encounter (Signed)
Roberto Collins had his rectal polyp surgically removed. Dr. Rogue Bussing will not need to him back in clinic. He would like to make sure that Mr. Olmsted follows up with GI. Note routed to Dr. Bonna Gains and staff to assist with arranging follow up.

## 2019-07-27 ENCOUNTER — Ambulatory Visit (INDEPENDENT_AMBULATORY_CARE_PROVIDER_SITE_OTHER): Payer: Medicare Other | Admitting: Gastroenterology

## 2019-07-27 ENCOUNTER — Encounter: Payer: Self-pay | Admitting: Gastroenterology

## 2019-07-27 ENCOUNTER — Other Ambulatory Visit: Payer: Self-pay

## 2019-07-27 VITALS — BP 110/74 | HR 105 | Temp 98.2°F | Wt 130.0 lb

## 2019-07-27 DIAGNOSIS — D500 Iron deficiency anemia secondary to blood loss (chronic): Secondary | ICD-10-CM | POA: Diagnosis not present

## 2019-07-27 DIAGNOSIS — K635 Polyp of colon: Secondary | ICD-10-CM | POA: Diagnosis not present

## 2019-07-27 NOTE — Progress Notes (Signed)
[name], MD [address]  [place], [place] [address]  Main: [phone]  Fax: [phone]   Primary Care Physician: Patient, No Pcp Per   Chief Complaint  Patient presents with  . New Patient (Initial Visit)    HPI: Roberto Collins is a 68 y.o. male previously seen in July 2020 in the hospital, for iron deficiency anemia.  Underwent EGD and colonoscopy.  Colonoscopy showed a fair prep.  A frond-like large mass was found in the rectum.  Multiple diverticula were noted.  5-6, 4 to 7 mm polyps were seen in the sigmoid colon and were not removed Due to patient's severe anemia.  EGD showed an irregular Z-line and was otherwise normal.  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  Patient has since undergone robotic TMAIS partial proctectomy with Dr. 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  Patient denies any blood in stool, constipation or diarrhea, weight loss, appetite loss, nausea or vomiting.  Doing well post surgery.  Iron deficiency anemia have improved with last hemoglobin being around 12  Current Outpatient Medications  Medication Sig Dispense Refill  . albuterol (VENTOLIN HFA) 108 (90 Base) MCG/ACT inhaler Inhale 2 puffs into the lungs every 4 (four) hours as needed for wheezing or shortness of breath.     . chlorproMAZINE (THORAZINE) 100 MG tablet Take 300 mg by mouth at bedtime.     . Cholecalciferol (VITAMIN D) 50 MCG (2000 UT) CAPS Take 2,000 Units by mouth daily.    . cyclobenzaprine (FLEXERIL) 10 MG tablet Take 10 mg by mouth at bedtime.    . folic acid (FOLVITE) 1 MG tablet Take 1 tablet (1 mg total) by mouth daily.    Marland Kitchen gabapentin (NEURONTIN) 600 MG tablet Take 1,800 mg by mouth at bedtime.     . lidocaine (LIDODERM) 5 % Place 1 patch onto the skin daily as needed (pain). Remove & Discard patch within 12 hours or as  directed by MD    . morphine (MS CONTIN) 60 MG 12 hr tablet Take 60 mg by mouth every 12 (twelve) hours.    . Multiple Vitamin (MULTIVITAMIN WITH MINERALS) TABS tablet Take 1 tablet by mouth daily.    Marland Kitchen oxyCODONE (OXY IR/ROXICODONE) 5 MG immediate release tablet Take 1-2 tablets (5-10 mg total) by mouth every 6 (six) hours as needed for moderate pain, severe pain or breakthrough pain. 20 tablet 0  . oxyCODONE-acetaminophen (PERCOCET/ROXICET) 5-325 MG tablet Take 1 tablet by mouth See admin instructions. Take 1 tablet every morning. Take 1 tablet later in the day as needed for pain.     No current facility-administered medications for this visit.     Allergies as of 07/27/2019 - Review Complete 07/27/2019  Allergen Reaction Noted  . Bee venom Anaphylaxis 04/04/2012  . Penicillins Anaphylaxis and Swelling 03/30/2019    ROS:  General: Negative for anorexia, weight loss, fever, chills, fatigue, weakness. ENT: Negative for hoarseness, difficulty swallowing , nasal congestion. CV: Negative for chest pain, angina, palpitations, dyspnea on exertion, peripheral edema.  Respiratory: Negative for dyspnea at rest, dyspnea on exertion, cough, sputum, wheezing.  GI: See history of present illness. GU:  Negative for dysuria, hematuria, urinary incontinence, urinary frequency, nocturnal urination.  Endo: Negative for unusual weight change.    Physical Examination:   BP 110/74 (BP Location: Left Arm, Patient Position: Sitting, Cuff Size: Normal)   Pulse Marland Kitchen)  105   Temp 98.2 F (36.8 C) (Oral)   Wt 130 lb (59 kg)   BMI 18.13 kg/m   General: Well-nourished, well-developed in no acute distress.  Eyes: No icterus. Conjunctivae pink. Mouth: Oropharyngeal mucosa moist and pink , no lesions erythema or exudate. Neck: Supple, Trachea midline Abdomen: Bowel sounds are normal, nontender, nondistended, no hepatosplenomegaly or masses, no abdominal bruits or hernia , no rebound or guarding.   Extremities:  No lower extremity edema. No clubbing or deformities. Neuro: Alert and oriented x 3.  Grossly intact. Skin: Warm and dry, no jaundice.   Psych: Alert and cooperative, normal mood and affect.   Labs: CMP     Component Value Date/Time   NA 139 06/26/2019 0340   K 3.9 06/26/2019 0340   CL 106 06/26/2019 0340   CO2 24 06/26/2019 0340   GLUCOSE 132 (H) 06/26/2019 0340   BUN 15 06/26/2019 0340   CREATININE 0.77 06/26/2019 0340   CALCIUM 9.2 06/26/2019 0340   PROT 6.9 04/10/2019 1553   ALBUMIN 3.8 04/10/2019 1553   AST 23 04/10/2019 1553   ALT 19 04/10/2019 1553   ALKPHOS 56 04/10/2019 1553   BILITOT 0.5 04/10/2019 1553   GFRNONAA >60 06/26/2019 0340   GFRAA >60 06/26/2019 0340   Lab Results  Component Value Date   WBC 12.0 (H) 06/26/2019   HGB 12.1 (L) 06/26/2019   HCT 35.4 (L) 06/26/2019   MCV 90.3 06/26/2019   PLT 135 (L) 06/26/2019    Imaging Studies: No results found.  Assessment and Plan:   Roberto Collins is a 68 y.o. 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  We will set recall as we are not scheduling into the new year yet  Patient was advised to set up a primary care physician as soon as possible and a list was given for the same and he verbalized understanding  Clinic follow-up in 3 to 4 months  Dr [name]

## 2019-08-03 ENCOUNTER — Ambulatory Visit
Admission: RE | Admit: 2019-08-03 | Discharge: 2019-08-03 | Disposition: A | Discharge status: Home / Self Care | Payer: Medicare Other | Attending: Family Medicine | Admitting: Family Medicine

## 2019-08-03 ENCOUNTER — Ambulatory Visit
Admission: RE | Admit: 2019-08-03 | Discharge: 2019-08-03 | Disposition: A | Discharge status: Home / Self Care | Payer: Medicare Other | Source: Ambulatory Visit | Attending: Family Medicine | Admitting: Family Medicine

## 2019-08-03 ENCOUNTER — Other Ambulatory Visit: Payer: Self-pay

## 2019-08-03 ENCOUNTER — Encounter: Payer: Self-pay | Admitting: Family Medicine

## 2019-08-03 ENCOUNTER — Ambulatory Visit (INDEPENDENT_AMBULATORY_CARE_PROVIDER_SITE_OTHER): Payer: Medicare Other | Admitting: Family Medicine

## 2019-08-03 VITALS — BP 122/68 | HR 74 | Temp 97.9°F | Resp 16 | Ht 70.0 in | Wt 132.0 lb

## 2019-08-03 DIAGNOSIS — R634 Abnormal weight loss: Secondary | ICD-10-CM

## 2019-08-03 DIAGNOSIS — F32A Depression, unspecified: Secondary | ICD-10-CM

## 2019-08-03 DIAGNOSIS — I7090 Unspecified atherosclerosis: Secondary | ICD-10-CM

## 2019-08-03 DIAGNOSIS — Z23 Encounter for immunization: Secondary | ICD-10-CM

## 2019-08-03 DIAGNOSIS — Z13220 Encounter for screening for lipoid disorders: Secondary | ICD-10-CM

## 2019-08-03 DIAGNOSIS — F5104 Psychophysiologic insomnia: Secondary | ICD-10-CM

## 2019-08-03 DIAGNOSIS — R918 Other nonspecific abnormal finding of lung field: Secondary | ICD-10-CM

## 2019-08-03 DIAGNOSIS — C20 Malignant neoplasm of rectum: Secondary | ICD-10-CM

## 2019-08-03 DIAGNOSIS — M545 Low back pain: Secondary | ICD-10-CM

## 2019-08-03 DIAGNOSIS — I2510 Atherosclerotic heart disease of native coronary artery without angina pectoris: Secondary | ICD-10-CM

## 2019-08-03 DIAGNOSIS — J438 Other emphysema: Secondary | ICD-10-CM

## 2019-08-03 DIAGNOSIS — Z7689 Persons encountering health services in other specified circumstances: Secondary | ICD-10-CM

## 2019-08-03 DIAGNOSIS — D696 Thrombocytopenia, unspecified: Secondary | ICD-10-CM

## 2019-08-03 DIAGNOSIS — J4520 Mild intermittent asthma, uncomplicated: Secondary | ICD-10-CM

## 2019-08-03 DIAGNOSIS — Z5181 Encounter for therapeutic drug level monitoring: Secondary | ICD-10-CM

## 2019-08-03 DIAGNOSIS — D500 Iron deficiency anemia secondary to blood loss (chronic): Secondary | ICD-10-CM | POA: Diagnosis not present

## 2019-08-03 DIAGNOSIS — F329 Major depressive disorder, single episode, unspecified: Secondary | ICD-10-CM

## 2019-08-03 DIAGNOSIS — G8929 Other chronic pain: Secondary | ICD-10-CM

## 2019-08-03 MED ORDER — ALBUTEROL SULFATE HFA 108 (90 BASE) MCG/ACT IN AERS: 2.0000 | INHALATION_SPRAY | RESPIRATORY_TRACT | 1 refills | Status: DC | PRN

## 2019-08-03 NOTE — Progress Notes (Signed)
Name: Roberto Collins   MRN: [id number]    DOB: Mar 18, 1951   Date:08/03/2019       Progress Note  Chief Complaint  Patient presents with  . Establish Care    Sees a pain specialist for his Chronic Back Issues     Subjective:   Roberto Collins is a 68 y.o. male, presents to establish care.  No PCP for several years, at least 10 + years, last PCP was in [place]  Hx of asthma - has albuterol inhaler, only uses once every 2-3 months.  Fall allergies sometimes trigger asthma, or cold weather.  Can't remember any times he's required steroids.  Has hx of chronic back pain, Surgeries/fusion in 2000 - has had chronic pain since see specialist - manage narcotic meds, has done [facility] before.  No change to his chronic back pain, no concerns denies any new paresthesias, weakness, numbness, saddle anesthesia, incontinence of stool or urine  Has RLS, insomnia, muscle spasms -managed with gabapentin  Rectal cancer -  Had high grade dysplasia, good margins, f/up with [facility] surgeon in January. Denies constipation, straining, rectal pain, melena , hematochezia   Weight loss -had weight lost around the time of his rectal procedures but he has since regained the weight, he denies any lymphadenopathy, night sweats, fevers, weakness, chest pain or shortness of breath Wt Readings from Last 5 Encounters:  08/03/19 132 lb (59.9 kg)  07/27/19 130 lb (59 kg)  06/26/19 130 lb 4.7 oz (59.1 kg)  06/17/19 130 lb (59 kg)  04/10/19 124 lb (56.2 kg)   BMI Readings from Last 5 Encounters:  08/03/19 18.94 kg/m  07/27/19 18.13 kg/m  06/26/19 18.17 kg/m  06/17/19 18.13 kg/m  04/10/19 18.16 kg/m   I did review available records with him while I was in the exam room, and looking at a recent CT was pertinent for atherosclerosis and Emphysema.  As well as a LLL infiltrate in July and follow-up imaging was recommended   Current smoker -cigarettes  - 1/2 ppd, has a total smoking history for about 48 years, at least 10  years he was a more heavy smoker with a pack per day and at least 30 years he has been able to decrease it to half a pack per day he had also been able to quit a few times.  Discussed smoking cessation and low-dose CT lung cancer screening options and available smoking cessation programs he was given a pamphlet today.  Past medical history of depression, currently denies any depression or decreased mood Depression screen [facility] 2/9 08/03/2019  Decreased Interest 0  Down, Depressed, Hopeless 0  PHQ - 2 Score 0  Altered sleeping 0  Tired, decreased energy 0  Change in appetite 0  Feeling bad or failure about yourself  0  Trouble concentrating 0  Moving slowly or fidgety/restless 0  Suicidal thoughts 0  PHQ-9 Score 0  Difficult doing work/chores Not difficult at all     Patient Active Problem List   Diagnosis Date Noted  . 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[place]) 04/10/2019  . Iron deficiency anemia due to chronic blood loss 04/10/2019  . Polyp of colon   . Colon neoplasm   . Diverticulosis of large intestine without diverticulitis   . Chronic insomnia 04/04/2012  . Chronic low back pain 04/04/2012  . Depression 04/04/2012    Past Surgical History:  Procedure Laterality Date  . BACK SURGERY  2000  .  COLONOSCOPY WITH PROPOFOL N/A 04/02/2019   Procedure: COLONOSCOPY WITH PROPOFOL;  Surgeon: Virgel Manifold, MD;  Location: [facility] ENDOSCOPY;  Service: Endoscopy;  Laterality: N/A;  . ESOPHAGOGASTRODUODENOSCOPY Left 03/31/2019   Procedure: ESOPHAGOGASTRODUODENOSCOPY (EGD);  Surgeon: Virgel Manifold, MD;  Location: [facility] ENDOSCOPY;  Service: Endoscopy;  Laterality: Left;  . GIVENS CAPSULE STUDY N/A 04/02/2019   Procedure: GIVENS CAPSULE STUDY;  Surgeon: Virgel Manifold, MD;  Location: [facility] ENDOSCOPY;  Service: Endoscopy;  Laterality: N/A;  . XI ROBOT ASSISTED TRANSANAL RESECTION N/A 06/25/2019    Procedure: XI ROBOT ASSISTED PARTIAL PROCTECTOMY OF RECTAL MASS USING TAMIS;  Surgeon: [name], MD;  Location: [facility] ORS;  Service: General;  Laterality: N/A;    Family History  Problem Relation Age of Onset  . Liver cancer Father     Social History   Socioeconomic History  . Marital status: Single    Spouse name: Not on file  . Number of children: Not on file  . Years of education: Not on file  . Highest education level: Not on file  Occupational History  . Occupation: disabled  Social Needs  . Financial resource strain: Not very hard  . Food insecurity    Worry: Never true    Inability: Never true  . Transportation needs    Medical: No    Non-medical: No  Tobacco Use  . Smoking status: Current Some Day Smoker    Packs/day: 0.50    Years: 10.00    Pack years: 5.00    Types: Cigarettes    Start date: 08/02/2009  . Smokeless tobacco: Never Used  Substance and Sexual Activity  . Alcohol use: Not Currently    Frequency: Never  . Drug use: Never  . Sexual activity: Not Currently  Lifestyle  . Physical activity    Days per week: 2 days    Minutes per session: 30 min  . Stress: Not at all  Relationships  . Social Herbalist on phone: Once a week    Gets together: Once a week    Attends religious service: Never    Active member of club or organization: No    Attends meetings of clubs or organizations: Never    Relationship status: Not on file  . Intimate partner violence    Fear of current or ex partner: No    Emotionally abused: No    Physically abused: No    Forced sexual activity: No  Other Topics Concern  . Not on file  Social History Narrative   Self; [place]; used to roofing/ disabled; no children; smoke 1/2 ppd; currently quit.       Brother- 2 years younger; mom-alive.      Current Outpatient Medications:  .  albuterol (VENTOLIN HFA) 108 (90 Base) MCG/ACT inhaler, Inhale 2 puffs into the lungs every 4 (four) hours as needed for  wheezing or shortness of breath. , Disp: , Rfl:  .  chlorproMAZINE (THORAZINE) 100 MG tablet, Take 300 mg by mouth at bedtime. , Disp: , Rfl:  .  Cholecalciferol (VITAMIN D) 50 MCG (2000 UT) CAPS, Take 2,000 Units by mouth daily., Disp: , Rfl:  .  cyclobenzaprine (FLEXERIL) 10 MG tablet, Take 10 mg by mouth at bedtime., Disp: , Rfl:  .  folic acid (FOLVITE) 1 MG tablet, Take 1 tablet (1 mg total) by mouth daily., Disp:  , Rfl:  .  gabapentin (NEURONTIN) 600 MG tablet, Take 1,800 mg by mouth at bedtime. , Disp: ,  Rfl:  .  lidocaine (LIDODERM) 5 %, Place 1 patch onto the skin daily as needed (pain). Remove & Discard patch within 12 hours or as directed by MD, Disp: , Rfl:  .  morphine (MS CONTIN) 60 MG 12 hr tablet, Take 60 mg by mouth every 12 (twelve) hours., Disp: , Rfl:  .  Multiple Vitamin (MULTIVITAMIN WITH MINERALS) TABS tablet, Take 1 tablet by mouth daily., Disp:  , Rfl:  .  oxyCODONE-acetaminophen (PERCOCET/ROXICET) 5-325 MG tablet, Take 1 tablet by mouth See admin instructions. Take 1 tablet every morning. Take 1 tablet later in the day as needed for pain., Disp: , Rfl:  .  pramipexole (MIRAPEX) 0.5 MG tablet, Take by mouth., Disp: , Rfl:  .  oxyCODONE (OXY IR/ROXICODONE) 5 MG immediate release tablet, Take 1-2 tablets (5-10 mg total) by mouth every 6 (six) hours as needed for moderate pain, severe pain or breakthrough pain. (Patient not taking: Reported on 08/03/2019), Disp: 20 tablet, Rfl: 0  Allergies  Allergen Reactions  . Bee Venom Anaphylaxis  . Penicillins Anaphylaxis and Swelling    Did it involve swelling of the face/tongue/throat, SOB, or low BP? Yes Did it involve sudden or severe rash/hives, skin peeling, or any reaction on the inside of your mouth or nose? No Did you need to seek medical attention at a hospital or doctor's office? Yes When did it last happen?Many years ago If all above answers are "NO", may proceed with cephalosporin use.     I personally reviewed  active problem list, medication list, allergies, family history, social history, health maintenance, notes from last encounter, lab results, imaging with the patient/caregiver today.  Review of Systems  Constitutional: Negative.   HENT: Negative.   Eyes: Negative.   Respiratory: Negative.   Cardiovascular: Negative.   Gastrointestinal: Negative.   Endocrine: Negative.   Genitourinary: Negative.   Musculoskeletal: Negative.   Skin: Negative.   Allergic/Immunologic: Negative.   Neurological: Negative.   Hematological: Negative.   Psychiatric/Behavioral: Negative.   All other systems reviewed and are negative.    Objective:    Vitals:   08/03/19 1251  BP: 122/68  Pulse: 74  Resp: 16  Temp: 97.9 F (36.6 C)  TempSrc: Temporal  SpO2: 96%  Weight: 132 lb (59.9 kg)  Height: 5\' 10"  (1.778 m)    Body mass index is 18.94 kg/m.  Physical Exam Vitals signs and nursing note reviewed.  Constitutional:      General: He is not in acute distress.    Appearance: He is well-developed. He is not ill-appearing, toxic-appearing or diaphoretic.     Interventions: Face mask in place.     Comments: Thin male appears older than stated age, no acute distress  HENT:     Head: Normocephalic and atraumatic.     Jaw: No trismus.     Right Ear: External ear normal.     Left Ear: External ear normal.  Eyes:     General: Lids are normal. No scleral icterus.    Conjunctiva/sclera: Conjunctivae normal.     Pupils: Pupils are equal, round, and reactive to light.  Neck:     Musculoskeletal: Normal range of motion and neck supple.     Trachea: Trachea and phonation normal. No tracheal deviation.  Cardiovascular:     Rate and Rhythm: Normal rate and regular rhythm.     Pulses: Normal pulses.          Radial pulses are 2+ on the right side and 2+  on the left side.       Posterior tibial pulses are 2+ on the right side and 2+ on the left side.     Heart sounds: Normal heart sounds. No murmur. No  friction rub. No gallop.   Pulmonary:     Effort: Pulmonary effort is normal. No respiratory distress.     Breath sounds: No stridor. Wheezing and rhonchi present. No rales.     Comments: Diffuse inspiratory and expiratory wheeze and scattered rhonchi more diminished breath sounds bilaterally at the bases but good chest expansion no retractions no accessory muscle use Abdominal:     General: Bowel sounds are normal. There is no distension.     Palpations: Abdomen is soft.     Tenderness: There is no abdominal tenderness. There is no guarding or rebound.  Musculoskeletal:     Right lower leg: No edema.     Left lower leg: No edema.  Skin:    General: Skin is warm and dry.     Capillary Refill: Capillary refill takes less than 2 seconds.     Coloration: Skin is not jaundiced.     Findings: No rash.     Nails: There is no clubbing.   Neurological:     Mental Status: He is alert.     Cranial Nerves: No dysarthria or facial asymmetry.     Sensory: No sensory deficit.     Motor: No weakness, tremor or abnormal muscle tone.     Coordination: Coordination normal.     Gait: Gait normal.  Psychiatric:        Mood and Affect: Mood normal.        Speech: Speech normal.        Behavior: Behavior normal. Behavior is cooperative.     Comments: Soft spoken and polite, good eye contact      Recent Results (from the past 2160 hour(s))  Basic metabolic panel     Status: None   Collection Time: 06/17/19  2:43 PM  Result Value Ref Range   Sodium 138 135 - 145 mmol/L   Potassium 4.2 3.5 - 5.1 mmol/L   Chloride 101 98 - 111 mmol/L   CO2 29 22 - 32 mmol/L   Glucose, Bld 92 70 - 99 mg/dL   BUN 19 8 - 23 mg/dL   Creatinine, Ser 0.88 0.61 - 1.24 mg/dL   Calcium 9.2 8.9 - 10.3 mg/dL   GFR calc non Af Amer >60 >60 mL/min   GFR calc Af Amer >60 >60 mL/min   Anion gap 8 5 - 15    Comment: Performed at [facility], [place] [address] [place], [place] [address]  CBC     Status:  Abnormal   Collection Time: 06/17/19  2:43 PM  Result Value Ref Range   WBC 5.3 4.0 - 10.5 K/uL   RBC 4.26 4.22 - 5.81 MIL/uL   Hemoglobin 12.9 (L) 13.0 - 17.0 g/dL   HCT 39.1 39.0 - 52.0 %   MCV 91.8 80.0 - 100.0 fL   MCH 30.3 26.0 - 34.0 pg   MCHC 33.0 30.0 - 36.0 g/dL   RDW 16.7 (H) 11.5 - 15.5 %   Platelets 161 150 - 400 K/uL   nRBC 0.0 0.0 - 0.2 %    Comment: Performed at [facility], [place] [address] [place], [place] [address]  Hemoglobin A1c     Status: None   Collection Time: 06/17/19  2:43 PM  Result  Value Ref Range   Hgb A1c MFr Bld 5.1 4.8 - 5.6 %    Comment: (NOTE) Pre diabetes:          5.7%-6.4% Diabetes:              >6.4% Glycemic control for   <7.0% adults with diabetes    Mean Plasma Glucose 99.67 mg/dL    Comment: Performed at [place] [address], [place], [place] [address]  SARS CORONAVIRUS 2 (TAT 6-24 HRS) Nasopharyngeal Nasopharyngeal Swab     Status: None   Collection Time: 06/22/19  3:25 PM   Specimen: Nasopharyngeal Swab  Result Value Ref Range   SARS Coronavirus 2 NEGATIVE NEGATIVE    Comment: (NOTE) SARS-CoV-2 target nucleic acids are NOT DETECTED. The SARS-CoV-2 RNA is generally detectable in upper and lower respiratory specimens during the acute phase of infection. Negative results do not preclude SARS-CoV-2 infection, do not rule out co-infections with other pathogens, and should not be used as the sole basis for treatment or other patient management decisions. Negative results must be combined with clinical observations, patient history, and epidemiological information. The expected result is Negative. Fact Sheet for Patients: SugarRoll.be Fact Sheet for Healthcare Providers: https://www.woods-mathews.com/ This test is not yet approved or cleared by the [place] FDA and  has been authorized for detection and/or diagnosis of SARS-CoV-2 by FDA under an Emergency  Use Authorization (EUA). This EUA will remain  in effect (meaning this test can be used) for the duration of the COVID-19 declaration under Section 56 4(b)(1) of the Act, 21 U.S.C. section 360bbb-3(b)(1), unless the authorization is terminated or revoked sooner. Performed at [facility], [place] [address] [place], [place] [address]   Surgical pathology     Status: None   Collection Time: 06/25/19  9:36 AM  Result Value Ref Range   SURGICAL PATHOLOGY      SURGICAL PATHOLOGY CASE: [id number] PATIENT: [name] Surgical Pathology Report     Clinical History: Rectal polyp     DIAGNOSIS:  A. RECTUM, POLYP, PATRIAL PROCTECTOMY: - Villous adenoma, 7.3 cm, with multiple foci of high-grade dysplasia. See comment - No evidence of invasive carcinoma - Resection margins are negative for dysplasia; closest is the left margin at 0.1 cm   COMMENT:  Please note that the entire erythematous lesion was submitted for histopathologic evaluation.    GROSS DESCRIPTION:  The specimen is received fresh pinned to a cork board, and consists of an oriented portion of polypoid, colonic mucosa measuring 8.4 cm proximal to distal by 6.8 cm right to left.  Per the requisition pins are present designating the proximal, distal, left and right aspects of the specimen.  The specimen is inked as follows: Proximal-red, right-green, distal-blue, left-yellow, deep-black.  The mucosal surface displays a 7.3 x 6.7 x 1.1 cm tan-pink,  ill-defined polypoid lesion. Mucosa surrounding the lesion is tan-red and slightly granular, and the lesion grossly measures 0.1 cm from the proximal and distal margins, 0.3 cm from the right margin, and 0.3 cm from the left margin.  Sectioning through the polypoid lesion reveals a tan, friable cut surface, without gross invasion.  The specimen is submitted in 36 cassettes. 1-4 = proximal margin, perpendicular 5-7 = distal margin, perpendicular 8-10 = left  margin, perpendicular 11-14 = right margin, perpendicular 15-36 = central portion of polypoid lesion, entirely submitted    Final Diagnosis performed by Jaquita Folds, MD.   Electronically signed 06/26/2019 Technical component performed at [facility]  [place] [address], [place], [place] [address].  Professional component performed at [employer]. [facility], [place] [address] [place], [place] [address].  Immunohistochemistry Technical component (if applicable) was performed at [facility] [address] [place], [place], [place] [address].   IMMUNOHISTOCHEMISTRY DISCLAIMER (if applicable): Some of these immunohistochemical stains may have been developed and the performance characteristics determine by [facility]. Some may not have been cleared or approved by the U.S. Food and Drug Administration. The FDA has determined that such clearance or approval is not necessary. This test is used for clinical purposes. It should not be regarded as investigational or for research. This laboratory is certified under the [place] (CLIA-88) as qualified to perform high complexity clinical laboratory testing.  The controls stained appropriately.   Basic metabolic panel     Status: Abnormal   Collection Time: 06/26/19  3:40 AM  Result Value Ref Range   Sodium 139 135 - 145 mmol/L   Potassium 3.9 3.5 - 5.1 mmol/L   Chloride 106 98 - 111 mmol/L   CO2 24 22 - 32 mmol/L   Glucose, Bld 132 (H) 70 - 99 mg/dL   BUN 15 8 - 23 mg/dL   Creatinine, Ser 0.77 0.61 - 1.24 mg/dL   Calcium 9.2 8.9 - 10.3 mg/dL   GFR calc non Af Amer >60 >60 mL/min   GFR calc Af Amer >60 >60 mL/min   Anion gap 9 5 - 15    Comment: Performed at [facility], [place] [address], [place], [place] [address]  CBC     Status: Abnormal   Collection Time: 06/26/19  3:40 AM  Result Value Ref Range   WBC 12.0 (H) 4.0 -  10.5 K/uL   RBC 3.92 (L) 4.22 - 5.81 MIL/uL   Hemoglobin 12.1 (L) 13.0 - 17.0 g/dL   HCT 35.4 (L) 39.0 - 52.0 %   MCV 90.3 80.0 - 100.0 fL   MCH 30.9 26.0 - 34.0 pg   MCHC 34.2 30.0 - 36.0 g/dL   RDW 15.5 11.5 - 15.5 %   Platelets 135 (L) 150 - 400 K/uL   nRBC 0.0 0.0 - 0.2 %    Comment: Performed at [facility], [place] [address] [place], [place] [address]  Magnesium     Status: None   Collection Time: 06/26/19  3:40 AM  Result Value Ref Range   Magnesium 2.1 1.7 - 2.4 mg/dL    Comment: Performed at [facility], [place] [address], [place], [place] [address]     PHQ2/9: Depression screen [facility] 2/9 08/03/2019  Decreased Interest 0  Down, Depressed, Hopeless 0  PHQ - 2 Score 0  Altered sleeping 0  Tired, decreased energy 0  Change in appetite 0  Feeling bad or failure about yourself  0  Trouble concentrating 0  Moving slowly or fidgety/restless 0  Suicidal thoughts 0  PHQ-9 Score 0  Difficult doing work/chores Not difficult at all    phq 9 is negative reviewed  Fall Risk: Fall Risk  08/03/2019  Falls in the past year? 0  Number falls in past yr: 0  Injury with Fall? 0   Functional Status Survey: Is the patient deaf or have difficulty hearing?: No Does the patient have difficulty seeing, even when wearing glasses/contacts?: No Does the patient have difficulty concentrating, remembering, or making decisions?: No Does the patient have difficulty walking or  climbing stairs?: No Does the patient have difficulty dressing or bathing?: No Does the patient have difficulty doing errands alone such as visiting a doctor's office or shopping?: No    Assessment & Plan:     ICD-10-CM   1. 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HCC)  C20    Good margins, no metastases, no active treatment and he has follow-up  2. Chronic low back pain, unspecified back pain laterality, unspecified whether sciatica present  M54.5    G89.29    with daily narcotic pain meds,  per specialist  3. Iron deficiency anemia due to chronic blood loss  D50.0 CBC with Differential/Platelet   hx of anemia with recent surgeries, recheck labs  4. Chronic insomnia  F51.04    current medications help, feels its well controlled  5. Depression, unspecified depression type  F32.9    denies current depression, PHQ neg  6. Thrombocytopenia (HCC)  D69.6 CBC with Differential/Platelet   Recheck labs, no bleeding concerns or symptoms  7. Other emphysema ([place])  J43.8 CBC with Differential/Platelet   Discussed CT findings, diagnosis and symptoms of management of chronic disease, some wheeze on exam although he denies any symptoms, albuterol inhaler given  8. Atherosclerosis  123456 COMPLETE METABOLIC PANEL WITH GFR    Lipid panel   On CT scan, will check cholesterol  9. Coronary artery disease involving native heart without angina pectoris, unspecified vessel or lesion type  0000000 COMPLETE METABOLIC PANEL WITH GFR    Lipid panel   Per specialist, checking lipids today  10. Screening for lipoid disorders  [id number] COMPLETE METABOLIC PANEL WITH GFR    Lipid panel  11. Encounter for medication monitoring  Z51.81 CBC with Differential/Platelet    COMPLETE METABOLIC PANEL WITH GFR    Lipid panel  12. Left lower lobe pulmonary infiltrate  R91.8 DG Chest 2 View   On recent CT, did not do any follow-up imaging that was recommended, wheeze and rhonchi today will do follow-up chest x-ray  13. Mild intermittent asthma without complication  [id number] albuterol (VENTOLIN HFA) 108 (90 Base) MCG/ACT inhaler   Reported history of asthma, currently wheezy not on any treatment, inhaler given  14. Weight loss, unintentional  R63.4 CBC with Differential/Platelet    COMPLETE METABOLIC PANEL WITH GFR   Did lose weight earlier this year but has regained some we will monitor weights, energy, protein, encouraged continued good nutrition, f/up if weight decreases  15. Needs flu shot  Z23 Flu vaccine HIGH DOSE PF  (Fluzone High dose)  16. Encounter to establish care with new doctor  Z76.89    I reviewed some records available in EMR and through care everywhere we will continue to review records and request records      Return in about 6 months (around 01/31/2020) for welcome to medicare .   [name], PA-C 08/03/19 1:16 PM

## 2019-08-07 ENCOUNTER — Encounter: Payer: Self-pay | Admitting: Family Medicine

## 2019-09-10 ENCOUNTER — Telehealth: Payer: Self-pay

## 2019-09-10 NOTE — Telephone Encounter (Signed)
Called patient to see if we could scheduled patient colonoscopy Patient states he will call us back to scheduled. Put patient on a 6 month recall list

## 2019-09-29 ENCOUNTER — Other Ambulatory Visit: Payer: Self-pay | Admitting: Family Medicine

## 2019-09-29 DIAGNOSIS — J4520 Mild intermittent asthma, uncomplicated: Secondary | ICD-10-CM

## 2019-11-11 ENCOUNTER — Other Ambulatory Visit: Payer: Self-pay | Admitting: Family Medicine

## 2019-11-11 DIAGNOSIS — J4520 Mild intermittent asthma, uncomplicated: Secondary | ICD-10-CM

## 2019-12-30 ENCOUNTER — Other Ambulatory Visit: Payer: Self-pay | Admitting: Family Medicine

## 2019-12-30 DIAGNOSIS — J4520 Mild intermittent asthma, uncomplicated: Secondary | ICD-10-CM

## 2020-02-02 ENCOUNTER — Ambulatory Visit (INDEPENDENT_AMBULATORY_CARE_PROVIDER_SITE_OTHER): Payer: Medicare Other

## 2020-02-02 ENCOUNTER — Other Ambulatory Visit: Payer: Self-pay

## 2020-02-02 VITALS — BP 112/66 | HR 94 | Temp 97.3°F | Resp 15 | Ht 70.0 in | Wt 134.1 lb

## 2020-02-02 DIAGNOSIS — Z0000 Encounter for general adult medical examination without abnormal findings: Secondary | ICD-10-CM

## 2020-02-02 NOTE — Progress Notes (Signed)
Subjective:   Roberto Collins is a 69 y.o. male who presents for an Initial Medicare Annual Wellness Visit.  Review of Systems   Cardiac Risk Factors include: advanced age (>21men, >72 women);smoking/ tobacco exposure;male gender    Objective:    Today's Vitals   02/02/20 0956  BP: 112/66  Pulse: 94  Resp: 15  Temp: (!) 97.3 F (36.3 C)  TempSrc: Temporal  SpO2: 98%  Weight: 134 lb 1.6 oz (60.8 kg)  Height: 5\' 10"  (1.778 m)   Body mass index is 19.24 kg/m.  Advanced Directives 02/02/2020 06/25/2019 04/10/2019 03/30/2019 03/30/2019 03/30/2019  Does Patient Have a Medical Advance Directive? No No No No No No  Would patient like information on creating a medical advance directive? No - Patient declined No - Patient declined Yes (Inpatient - patient requests chaplain consult to create a medical advance directive) Yes (Inpatient - patient requests chaplain consult to create a medical advance directive) Yes (Inpatient - patient requests chaplain consult to create a medical advance directive) -    Current Medications (verified) Outpatient Encounter Medications as of 02/02/2020  Medication Sig  . chlorproMAZINE (THORAZINE) 100 MG tablet Take 300 mg by mouth at bedtime.   . Cholecalciferol (VITAMIN D) 50 MCG (2000 UT) CAPS Take 2,000 Units by mouth daily.  . cyclobenzaprine (FLEXERIL) 10 MG tablet Take 10 mg by mouth at bedtime.  . folic acid (FOLVITE) 1 MG tablet Take 1 tablet (1 mg total) by mouth daily.  Marland Kitchen gabapentin (NEURONTIN) 600 MG tablet Take 1,800 mg by mouth at bedtime.   Marland Kitchen morphine (MS CONTIN) 60 MG 12 hr tablet Take 60 mg by mouth every 12 (twelve) hours.  . pramipexole (MIRAPEX) 0.5 MG tablet Take by mouth.  . [DISCONTINUED] albuterol (VENTOLIN HFA) 108 (90 Base) MCG/ACT inhaler INHALE 2 PUFFS INTO THE LUNGS EVERY 4 (FOUR) HOURS AS NEEDED FOR WHEEZING OR SHORTNESS OF BREATH.  . [DISCONTINUED] albuterol (VENTOLIN HFA) 108 (90 Base) MCG/ACT inhaler INHALE 2 PUFFS INTO THE LUNGS  EVERY 4 HOURS AS NEEDED FOR WHEEZE OR SHORTNESS OF BREATH  . [DISCONTINUED] lidocaine (LIDODERM) 5 % Place 1 patch onto the skin daily as needed (pain). Remove & Discard patch within 12 hours or as directed by MD  . [DISCONTINUED] Multiple Vitamin (MULTIVITAMIN WITH MINERALS) TABS tablet Take 1 tablet by mouth daily.  . [DISCONTINUED] oxyCODONE (OXY IR/ROXICODONE) 5 MG immediate release tablet Take 1-2 tablets (5-10 mg total) by mouth every 6 (six) hours as needed for moderate pain, severe pain or breakthrough pain. (Patient not taking: Reported on 08/03/2019)  . [DISCONTINUED] oxyCODONE-acetaminophen (PERCOCET/ROXICET) 5-325 MG tablet Take 1 tablet by mouth See admin instructions. Take 1 tablet every morning. Take 1 tablet later in the day as needed for pain.   No facility-administered encounter medications on file as of 02/02/2020.    Allergies (verified) Bee venom and Penicillins   History: Past Medical History:  Diagnosis Date  . Anemia   . Asthma   . Back pain   . Blood transfusion without reported diagnosis    Past Surgical History:  Procedure Laterality Date  . BACK SURGERY  2000  . COLONOSCOPY WITH PROPOFOL N/A 04/02/2019   Procedure: COLONOSCOPY WITH PROPOFOL;  Surgeon: Virgel Manifold, MD;  Location: [facility] ENDOSCOPY;  Service: Endoscopy;  Laterality: N/A;  . ESOPHAGOGASTRODUODENOSCOPY Left 03/31/2019   Procedure: ESOPHAGOGASTRODUODENOSCOPY (EGD);  Surgeon: Virgel Manifold, MD;  Location: [facility] ENDOSCOPY;  Service: Endoscopy;  Laterality: Left;  . GIVENS CAPSULE STUDY N/A 04/02/2019  Procedure: GIVENS CAPSULE STUDY;  Surgeon: Virgel Manifold, MD;  Location: [facility] ENDOSCOPY;  Service: Endoscopy;  Laterality: N/A;  . XI ROBOT ASSISTED TRANSANAL RESECTION N/A 06/25/2019   Procedure: XI ROBOT ASSISTED PARTIAL PROCTECTOMY OF RECTAL MASS USING TAMIS;  Surgeon: [name], MD;  Location: [facility] ORS;  Service: General;  Laterality: N/A;   Family History  Problem Relation Age of  Onset  . Liver cancer Father    Social History   Socioeconomic History  . Marital status: Single    Spouse name: Not on file  . Number of children: 0  . Years of education: Not on file  . Highest education level: Not on file  Occupational History  . Occupation: disabled  Tobacco Use  . Smoking status: Current Some Day Smoker    Packs/day: 0.50    Years: 10.00    Pack years: 5.00    Types: Cigarettes    Start date: 08/02/2009  . Smokeless tobacco: Never Used  . Tobacco comment: [place] smoking cessation program info provided  Substance and Sexual Activity  . Alcohol use: Not Currently  . Drug use: Never  . Sexual activity: Not Currently  Other Topics Concern  . Not on file  Social History Narrative   Self; Roberto Collins; used to roofing/ disabled; no children; smoke 1/2 ppd; currently quit.       Brother- 2 years younger; mom-alive.    Social Determinants of Health   Financial Resource Strain: Low Risk   . Difficulty of Paying Living Expenses: Not hard at all  Food Insecurity: No Food Insecurity  . Worried About Charity fundraiser in the Last Year: Never true  . Ran Out of Food in the Last Year: Never true  Transportation Needs: No Transportation Needs  . Lack of Transportation (Medical): No  . Lack of Transportation (Non-Medical): No  Physical Activity: Insufficiently Active  . Days of Exercise per Week: 2 days  . Minutes of Exercise per Session: 30 min  Stress: No Stress Concern Present  . Feeling of Stress : Not at all  Social Connections: Moderately Isolated  . Frequency of Communication with Friends and Family: More than three times a week  . Frequency of Social Gatherings with Friends and Family: Once a week  . Attends Religious Services: Never  . Active Member of Clubs or Organizations: No  . Attends Archivist Meetings: Never  . Marital Status: Never married   Tobacco Counseling Ready to quit: Yes Counseling given: Yes Comment: [place]  smoking cessation program info provided   Clinical Intake:  Pre-visit preparation completed: Yes  Pain : No/denies pain     BMI - recorded: 19.24 Nutritional Status: BMI of 19-24  Normal Nutritional Risks: None Diabetes: No  How often do you need to have someone help you when you read instructions, pamphlets, or other written materials from your doctor or pharmacy?: 1 - Never  Interpreter Needed?: No  Information entered by :: Clemetine Marker LPN  Activities of Daily Living In your present state of health, do you have any difficulty performing the following activities: 02/02/2020 08/03/2019  Hearing? N N  Comment declines hearing aids -  Vision? N N  Difficulty concentrating or making decisions? N N  Walking or climbing stairs? N N  Dressing or bathing? N N  Doing errands, shopping? N N  Preparing Food and eating ? N -  Using the Toilet? N -  In the past six months, have you accidently leaked urine?  N -  Do you have problems with loss of bowel control? N -  Managing your Medications? N -  Managing your Finances? N -  Housekeeping or managing your Housekeeping? N -  Some recent data might be hidden     Immunizations and Health Maintenance There is no immunization history for the selected administration types on file for this patient. There are no preventive care reminders to display for this patient.  Patient Care Team: [name], PA-C as PCP - General (Family Medicine) Clent Jacks, RN as Oncology Nurse Navigator [name], MD as Consulting Physician (Colon and Rectal Surgery) Virgel Manifold, MD as Consulting Physician (Gastroenterology) Cammie Sickle, MD as Consulting Physician (Hematology)  Indicate any recent Medical Services you may have received from other than [facility] providers in the past year (date may be approximate).    Assessment:   This is a routine wellness examination for [place].  Hearing/Vision screen  Hearing Screening   125Hz   250Hz  500Hz  1000Hz  2000Hz  3000Hz  4000Hz  6000Hz  8000Hz   Right ear:           Left ear:           Comments: Pt denies hearing difficulty  Vision Screening Comments: Annual vision screenings with eye provider in [place] issues and exercise activities discussed: Current Exercise Habits: Home exercise routine, Type of exercise: walking, Time (Minutes): 20, Frequency (Times/Week): 2, Weekly Exercise (Minutes/Week): 40, Intensity: Mild, Exercise limited by: orthopedic condition(s)  Goals   None    Depression Screen PHQ 2/9 Scores 02/02/2020 08/03/2019  PHQ - 2 Score 0 0  PHQ- 9 Score - 0    Fall Risk Fall Risk  02/02/2020 08/03/2019  Falls in the past year? 0 0  Number falls in past yr: 0 0  Injury with Fall? 0 0  Risk for fall due to : No Fall Risks -  Follow up Falls prevention discussed -    FALL RISK PREVENTION PERTAINING TO THE HOME:  Any stairs in or around the home? Yes  If so, do they handrails? Yes   Home free of loose throw rugs in walkways, pet beds, electrical cords, etc? Yes  Adequate lighting in your home to reduce risk of falls? Yes   ASSISTIVE DEVICES UTILIZED TO PREVENT FALLS:  Life alert? No  Use of a cane, walker or w/c? No  Grab bars in the bathroom? No  Shower chair or bench in shower? No  Elevated toilet seat or a handicapped toilet? No   DME ORDERS:  DME order needed?  No   TIMED UP AND GO:  Was the test performed? Yes .  Length of time to ambulate 10 feet: 5 sec.   GAIT:  Appearance of gait: Gait stead-fast and without the use of an assistive device.   Education: Fall risk prevention has been discussed.  Intervention(s) required? No   Cognitive Function:     6CIT Screen 02/02/2020  What Year? 0 points  What month? 0 points  What time? 0 points  Count back from 20 0 points  Months in reverse 4 points  Repeat phrase 10 points  Total Score 14    Screening Tests Health Maintenance  Topic Date Due  . COVID-19 Vaccine (1)  02/18/2020 (Originally 11/04/1966)  . TETANUS/TDAP  08/02/2020 (Originally 11/05/1969)  . Hepatitis C Screening  08/02/2020 (Originally [birth year])  . PNA vac Low Risk Adult (1 of 2 - PCV13) 08/02/2020 (Originally 11/04/2017)  . COLONOSCOPY  04/01/2020  . INFLUENZA  VACCINE  05/01/2020    Qualifies for Shingles Vaccine? Yes . Due for Shingrix. Education has been provided regarding the importance of this vaccine. Pt has been advised to call insurance company to determine out of pocket expense. Advised may also receive vaccine at local pharmacy or Health Dept. Verbalized acceptance and understanding.  Tdap: Although this vaccine is not a covered service during a Wellness Exam, does the patient still wish to receive this vaccine today?  No .  Education has been provided regarding the importance of this vaccine. Advised may receive this vaccine at local pharmacy or Health Dept. Aware to provide a copy of the vaccination record if obtained from local pharmacy or Health Dept. Verbalized acceptance and understanding.  Flu Vaccine: Due for Flu vaccine. Does the patient want to receive this vaccine today?  No . Education has been provided regarding the importance of this vaccine but still declined. Advised may receive this vaccine at local pharmacy or Health Dept. Aware to provide a copy of the vaccination record if obtained from local pharmacy or Health Dept. Verbalized acceptance and understanding.  Pneumococcal Vaccine: Due for Pneumococcal vaccine. Does the patient want to receive this vaccine today?  No . Education has been provided regarding the importance of this vaccine but still declined. Advised may receive this vaccine at local pharmacy or Health Dept. Aware to provide a copy of the vaccination record if obtained from local pharmacy or Health Dept. Verbalized acceptance and understanding.  Covid-19 Vaccine: Due for Covid-19 vaccine. Education has been provided regarding the importance of this vaccine.  Advised may receive this vaccine at local pharmacy, health department or [place] vaccine clinic. Aware to provide a copy of the vaccination record if obtained from local pharmacy or health department. Verbalized acceptance and understanding.    Cancer Screenings:  Colorectal Screening: Completed 04/01/20. Recommend repeat in 3-6 months. Pt due for repeat screening colonoscopy. Advised to contact GI to schedule.   Lung Cancer Screening: (Low Dose CT Chest recommended if Age 55-80 years, 30 pack-year currently smoking OR have quit w/in 15years.) does not qualify.     Additional Screening:  Hepatitis C Screening: does qualify; postponed  Vision Screening: Recommended annual ophthalmology exams for early detection of glaucoma and other disorders of the eye. Is the patient up to date with their annual eye exam?  Yes  Who is the provider or what is the name of the office in which the pt attends annual eye exams? Eye provider in [place]: Recommended annual dental exams for proper oral hygiene  Community Resource Referral:  CRR required this visit?  No       Plan:    I have personally reviewed and addressed the Medicare Annual Wellness questionnaire and have noted the following in the patient's chart:  A. Medical and social history B. Use of alcohol, tobacco or illicit drugs  C. Current medications and supplements D. Functional ability and status E.  Nutritional status F.  Physical activity G. Advance directives H. List of other physicians I.  Hospitalizations, surgeries, and ER visits in previous 12 months J.  [place] such as hearing and vision if needed, cognitive and depression L. Referrals and appointments   In addition, I have reviewed and discussed with patient certain preventive protocols, quality metrics, and best practice recommendations. A written personalized care plan for preventive services as well as general preventive health  recommendations were provided to patient.   Signed,  Clemetine Marker, LPN Nurse Health  Advisor   Nurse Notes:

## 2020-02-02 NOTE — Patient Instructions (Signed)
Roberto Collins , Thank you for taking time to come for your Medicare Wellness Visit. I appreciate your ongoing commitment to your health goals. Please review the following plan we discussed and let me know if I can assist you in the future.   Screening recommendations/referrals: Colonoscopy: done 04/02/19. Please contact [facility] to schedule repeat colonoscopy. Call [phone]. Recommended yearly ophthalmology/optometry visit for glaucoma screening and checkup Recommended yearly dental visit for hygiene and checkup  Vaccinations: Influenza vaccine: postponed Pneumococcal vaccine: postponed Tdap vaccine: due Shingles vaccine: Shingrix discussed. Please contact your pharmacy for coverage information.  Covid-19: due  Advanced directives: Advance directive discussed with you today. Even though you declined this today please call our office should you change your mind and we can give you the proper paperwork for you to fill out.  Conditions/risks identified: Recommend increasing physical activity   Next appointment: Please follow up in one year for your Medicare Annual Wellness visit.    Preventive Care 41 Years and Older, Male Preventive care refers to lifestyle choices and visits with your health care provider that can promote health and wellness. What does preventive care include?  A yearly physical exam. This is also called an annual well check.  Dental exams once or twice a year.  Routine eye exams. Ask your health care provider how often you should have your eyes checked.  Personal lifestyle choices, including:  Daily care of your teeth and gums.  Regular physical activity.  Eating a healthy diet.  Avoiding tobacco and drug use.  Limiting alcohol use.  Practicing safe sex.  Taking low doses of aspirin every day.  Taking vitamin and mineral supplements as recommended by your health care provider. What happens during an annual well check? The services and  screenings done by your health care provider during your annual well check will depend on your age, overall health, lifestyle risk factors, and family history of disease. Counseling  Your health care provider may ask you questions about your:  Alcohol use.  Tobacco use.  Drug use.  Emotional well-being.  Home and relationship well-being.  Sexual activity.  Eating habits.  History of falls.  Memory and ability to understand (cognition).  Work and work Statistician. Screening  You may have the following tests or measurements:  Height, weight, and BMI.  Blood pressure.  Lipid and cholesterol levels. These may be checked every 5 years, or more frequently if you are over 21 years old.  Skin check.  Lung cancer screening. You may have this screening every year starting at age 47 if you have a 30-pack-year history of smoking and currently smoke or have quit within the past 15 years.  Fecal occult blood test (FOBT) of the stool. You may have this test every year starting at age 38.  Flexible sigmoidoscopy or colonoscopy. You may have a sigmoidoscopy every 5 years or a colonoscopy every 10 years starting at age 51.  Prostate cancer screening. Recommendations will vary depending on your family history and other risks.  Hepatitis C blood test.  Hepatitis B blood test.  Sexually transmitted disease (STD) testing.  Diabetes screening. This is done by checking your blood sugar (glucose) after you have not eaten for a while (fasting). You may have this done every 1-3 years.  Abdominal aortic aneurysm (AAA) screening. You may need this if you are a current or former smoker.  Osteoporosis. You may be screened starting at age 66 if you are at high risk. Talk with your health care provider  about your test results, treatment options, and if necessary, the need for more tests. Vaccines  Your health care provider may recommend certain vaccines, such as:  Influenza vaccine. This is  recommended every year.  Tetanus, diphtheria, and acellular pertussis (Tdap, Td) vaccine. You may need a Td booster every 10 years.  Zoster vaccine. You may need this after age 71.  Pneumococcal 13-valent conjugate (PCV13) vaccine. One dose is recommended after age 11.  Pneumococcal polysaccharide (PPSV23) vaccine. One dose is recommended after age 50. Talk to your health care provider about which screenings and vaccines you need and how often you need them. This information is not intended to replace advice given to you by your health care provider. Make sure you discuss any questions you have with your health care provider. Document Released: 10/14/2015 Document Revised: 06/06/2016 Document Reviewed: 07/19/2015 Elsevier Interactive Patient Education  2017 [place] Prevention in the Home Falls can cause injuries. They can happen to people of all ages. There are many things you can do to make your home safe and to help prevent falls. What can I do on the outside of my home?  Regularly fix the edges of walkways and driveways and fix any cracks.  Remove anything that might make you trip as you walk through a door, such as a raised step or threshold.  Trim any bushes or trees on the path to your home.  Use bright outdoor lighting.  Clear any walking paths of anything that might make someone trip, such as rocks or tools.  Regularly check to see if handrails are loose or broken. Make sure that both sides of any steps have handrails.  Any raised decks and porches should have guardrails on the edges.  Have any leaves, snow, or ice cleared regularly.  Use sand or salt on walking paths during winter.  Clean up any spills in your garage right away. This includes oil or grease spills. What can I do in the bathroom?  Use night lights.  Install grab bars by the toilet and in the tub and shower. Do not use towel bars as grab bars.  Use non-skid mats or decals in the tub or  shower.  If you need to sit down in the shower, use a plastic, non-slip stool.  Keep the floor dry. Clean up any water that spills on the floor as soon as it happens.  Remove soap buildup in the tub or shower regularly.  Attach bath mats securely with double-sided non-slip rug tape.  Do not have throw rugs and other things on the floor that can make you trip. What can I do in the bedroom?  Use night lights.  Make sure that you have a light by your bed that is easy to reach.  Do not use any sheets or blankets that are too big for your bed. They should not hang down onto the floor.  Have a firm chair that has side arms. You can use this for support while you get dressed.  Do not have throw rugs and other things on the floor that can make you trip. What can I do in the kitchen?  Clean up any spills right away.  Avoid walking on wet floors.  Keep items that you use a lot in easy-to-reach places.  If you need to reach something above you, use a strong step stool that has a grab bar.  Keep electrical cords out of the way.  Do not use floor polish or  wax that makes floors slippery. If you must use wax, use non-skid floor wax.  Do not have throw rugs and other things on the floor that can make you trip. What can I do with my stairs?  Do not leave any items on the stairs.  Make sure that there are handrails on both sides of the stairs and use them. Fix handrails that are broken or loose. Make sure that handrails are as long as the stairways.  Check any carpeting to make sure that it is firmly attached to the stairs. Fix any carpet that is loose or worn.  Avoid having throw rugs at the top or bottom of the stairs. If you do have throw rugs, attach them to the floor with carpet tape.  Make sure that you have a light switch at the top of the stairs and the bottom of the stairs. If you do not have them, ask someone to add them for you. What else can I do to help prevent  falls?  Wear shoes that:  Do not have high heels.  Have rubber bottoms.  Are comfortable and fit you well.  Are closed at the toe. Do not wear sandals.  If you use a stepladder:  Make sure that it is fully opened. Do not climb a closed stepladder.  Make sure that both sides of the stepladder are locked into place.  Ask someone to hold it for you, if possible.  Clearly mark and make sure that you can see:  Any grab bars or handrails.  First and last steps.  Where the edge of each step is.  Use tools that help you move around (mobility aids) if they are needed. These include:  Canes.  Walkers.  Scooters.  Crutches.  Turn on the lights when you go into a dark area. Replace any light bulbs as soon as they burn out.  Set up your furniture so you have a clear path. Avoid moving your furniture around.  If any of your floors are uneven, fix them.  If there are any pets around you, be aware of where they are.  Review your medicines with your doctor. Some medicines can make you feel dizzy. This can increase your chance of falling. Ask your doctor what other things that you can do to help prevent falls. This information is not intended to replace advice given to you by your health care provider. Make sure you discuss any questions you have with your health care provider. Document Released: 07/14/2009 Document Revised: 02/23/2016 Document Reviewed: 10/22/2014 Elsevier Interactive Patient Education  2017 [employer].

## 2020-04-05 ENCOUNTER — Ambulatory Visit (INDEPENDENT_AMBULATORY_CARE_PROVIDER_SITE_OTHER): Payer: Medicare Other | Admitting: Gastroenterology

## 2020-04-05 ENCOUNTER — Other Ambulatory Visit: Payer: Self-pay

## 2020-04-05 ENCOUNTER — Encounter: Payer: Self-pay | Admitting: Gastroenterology

## 2020-04-05 VITALS — BP 107/65 | HR 91 | Temp 98.2°F | Ht 70.0 in | Wt 129.4 lb

## 2020-04-05 DIAGNOSIS — Z86010 Personal history of colonic polyps: Secondary | ICD-10-CM | POA: Diagnosis not present

## 2020-04-05 NOTE — Progress Notes (Signed)
[name], MD [address]  [place]  [place], [place] [address]  Main: [phone]  Fax: [phone]   Primary Care Physician: [name], PA-C   Chief Complaint  Patient presents with  . Follow up IDA    HPI: Roberto Collins is a 69 y.o. male history of iron deficiency anemia, rectal villous adenoma with high-grade dysplasia here for follow-up.    Underwent EGD and colonoscopy in July 2020.  Colonoscopy showed a fair prep.  A frond-like large mass was found in the rectum.  Multiple diverticula were noted.  5-6, 4 to 7 mm polyps were seen in the sigmoid colon and were not removed Due to patient's severe anemia.  EGD showed an irregular Z-line and was otherwise normal.  Pathology showed high-grade dysplasia with focal high-grade dysplasia.  Patient has since undergone robotic TMAIS partial proctectomy with Dr. 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  Patient denies any blood in stool, constipation or diarrhea, weight loss, appetite loss, nausea or vomiting.   Iron deficiency anemia have improved with last hemoglobin being around 12  Current Outpatient Medications  Medication Sig Dispense Refill  . chlorproMAZINE (THORAZINE) 100 MG tablet Take 300 mg by mouth at bedtime.     . Cholecalciferol (VITAMIN D) 50 MCG (2000 UT) CAPS Take 2,000 Units by mouth daily.    . cyclobenzaprine (FLEXERIL) 10 MG tablet Take 10 mg by mouth at bedtime.    . folic acid (FOLVITE) 1 MG tablet Take 1 tablet (1 mg total) by mouth daily.    Marland Kitchen gabapentin (NEURONTIN) 600 MG tablet Take 1,800 mg by mouth at bedtime.     Marland Kitchen morphine (MS CONTIN) 60 MG 12 hr tablet Take 60 mg by mouth every 12 (twelve) hours.    . pramipexole (MIRAPEX) 0.5 MG tablet Take by mouth.     No current facility-administered medications for this visit.    Allergies as of 04/05/2020 - Review Complete 04/05/2020    Allergen Reaction Noted  . Bee venom Anaphylaxis 04/04/2012  . Penicillins Anaphylaxis and Swelling 03/30/2019    ROS:  General: Negative for anorexia, weight loss, fever, chills, fatigue, weakness. ENT: Negative for hoarseness, difficulty swallowing , nasal congestion. CV: Negative for chest pain, angina, palpitations, dyspnea on exertion, peripheral edema.  Respiratory: Negative for dyspnea at rest, dyspnea on exertion, cough, sputum, wheezing.  GI: See history of present illness. GU:  Negative for dysuria, hematuria, urinary incontinence, urinary frequency, nocturnal urination.  Endo: Negative for unusual weight change.    Physical Examination:   BP 107/65   Pulse 91   Temp 98.2 F (36.8 C) (Oral)   Ht 5\' 10"  (1.778 m)   Wt 129 lb 6.4 oz (58.7 kg)   BMI 18.57 kg/m   General: Well-nourished, well-developed in no acute distress.  Eyes: No icterus. Conjunctivae pink. Mouth: Oropharyngeal mucosa moist and pink , no lesions erythema or exudate. Neck: Supple, Trachea midline Abdomen: Bowel sounds are normal, nontender, nondistended, no hepatosplenomegaly or masses, no abdominal bruits or hernia , no rebound or guarding.   Extremities: No lower extremity edema. No clubbing or deformities. Neuro: Alert and oriented x 3.  Grossly intact. Skin: Warm and dry, no jaundice.   Psych: Alert and cooperative, normal mood and affect.   Labs: CMP     Component Value Date/Time   NA 139 06/26/2019 0340   K 3.9 06/26/2019 0340   CL  106 06/26/2019 0340   CO2 24 06/26/2019 0340   GLUCOSE 132 (H) 06/26/2019 0340   BUN 15 06/26/2019 0340   CREATININE 0.77 06/26/2019 0340   CALCIUM 9.2 06/26/2019 0340   PROT 6.9 04/10/2019 1553   ALBUMIN 3.8 04/10/2019 1553   AST 23 04/10/2019 1553   ALT 19 04/10/2019 1553   ALKPHOS 56 04/10/2019 1553   BILITOT 0.5 04/10/2019 1553   GFRNONAA >60 06/26/2019 0340   GFRAA >60 06/26/2019 0340   Lab Results  Component Value Date   WBC 12.0 (H)  06/26/2019   HGB 12.1 (L) 06/26/2019   HCT 35.4 (L) 06/26/2019   MCV 90.3 06/26/2019   PLT 135 (L) 06/26/2019    Imaging Studies: No results found.  Assessment and Plan:   Roberto Collins is a 69 y.o. y/o male here for follow-up of history of polyps on last colonoscopy that were not removed due to severe anemia at that time  Anemia has improved Patient willing to schedule colonoscopy  I have discussed alternative options, risks & benefits,  which include, but are not limited to, bleeding, infection, perforation,respiratory complication & drug reaction.  The patient agrees with this plan & written consent will be obtained.       Dr [name]

## 2020-04-06 MED ORDER — NA SULFATE-K SULFATE-MG SULF 17.5-3.13-1.6 GM/177ML PO SOLN: ORAL | 0 refills | Status: DC

## 2020-04-07 ENCOUNTER — Telehealth: Payer: Self-pay

## 2020-04-07 ENCOUNTER — Other Ambulatory Visit: Payer: Self-pay | Admitting: Gastroenterology

## 2020-04-07 MED ORDER — PEG 3350-KCL-NA BICARB-NACL 420 G PO SOLR: ORAL | 0 refills | Status: DC

## 2020-04-07 NOTE — Telephone Encounter (Signed)
Patient called stating that his Suprep was too expensive and would like to know if something would be cheaper. I told him that I would send a new prescription to his pharmacy and hopefully it would not be expensive for him to get. Patient agreed. I then called his pharmacy to make sure and the pharmacist-Shawn stated that his Suprep would be $27.96.  I called patient back to let him know of the price and he stated that he would go and pick it up today. Patient had no further questions.

## 2020-04-15 ENCOUNTER — Other Ambulatory Visit: Payer: Self-pay

## 2020-04-15 ENCOUNTER — Other Ambulatory Visit
Admission: RE | Admit: 2020-04-15 | Discharge: 2020-04-15 | Disposition: A | Discharge status: Home / Self Care | Payer: Medicare Other | Source: Ambulatory Visit | Attending: Gastroenterology | Admitting: Gastroenterology

## 2020-04-15 DIAGNOSIS — Z20822 Contact with and (suspected) exposure to covid-19: Secondary | ICD-10-CM | POA: Insufficient documentation

## 2020-04-15 LAB — SARS CORONAVIRUS 2 (TAT 6-24 HRS): SARS Coronavirus 2: NEGATIVE

## 2020-04-16 ENCOUNTER — Other Ambulatory Visit: Payer: Self-pay | Admitting: Gastroenterology

## 2020-04-19 ENCOUNTER — Ambulatory Visit
Admission: RE | Admit: 2020-04-19 | Discharge: 2020-04-19 | Disposition: A | Discharge status: Home / Self Care | Payer: Medicare Other | Attending: Gastroenterology | Admitting: Gastroenterology

## 2020-04-19 ENCOUNTER — Other Ambulatory Visit: Payer: Self-pay

## 2020-04-19 ENCOUNTER — Encounter: Payer: Self-pay | Admitting: Gastroenterology

## 2020-04-19 ENCOUNTER — Encounter
Admission: RE | Disposition: A | Discharge status: Home / Self Care | Payer: Self-pay | Source: Home / Self Care | Attending: Gastroenterology

## 2020-04-19 DIAGNOSIS — Z86010 Personal history of colonic polyps: Secondary | ICD-10-CM

## 2020-04-19 SURGERY — COLONOSCOPY WITH PROPOFOL
Anesthesia: General

## 2020-04-19 MED ORDER — SODIUM CHLORIDE 0.9 % IV SOLN: INTRAVENOUS | Status: DC

## 2020-04-19 NOTE — Progress Notes (Signed)
Patient not clean coming back tomorrow after doing anther prep

## 2020-04-20 ENCOUNTER — Ambulatory Visit: Payer: Medicare Other | Admitting: Certified Registered Nurse Anesthetist

## 2020-04-20 ENCOUNTER — Ambulatory Visit
Admission: RE | Admit: 2020-04-20 | Discharge: 2020-04-20 | Disposition: A | Discharge status: Home / Self Care | Payer: Medicare Other | Attending: Gastroenterology | Admitting: Gastroenterology

## 2020-04-20 ENCOUNTER — Encounter
Admission: RE | Disposition: A | Discharge status: Home / Self Care | Payer: Self-pay | Source: Home / Self Care | Attending: Gastroenterology

## 2020-04-20 DIAGNOSIS — Z79899 Other long term (current) drug therapy: Secondary | ICD-10-CM | POA: Insufficient documentation

## 2020-04-20 DIAGNOSIS — F329 Major depressive disorder, single episode, unspecified: Secondary | ICD-10-CM | POA: Insufficient documentation

## 2020-04-20 DIAGNOSIS — F17210 Nicotine dependence, cigarettes, uncomplicated: Secondary | ICD-10-CM | POA: Diagnosis not present

## 2020-04-20 DIAGNOSIS — Z860100 Personal history of colon polyps, unspecified: Secondary | ICD-10-CM

## 2020-04-20 DIAGNOSIS — Z1211 Encounter for screening for malignant neoplasm of colon: Secondary | ICD-10-CM | POA: Diagnosis present

## 2020-04-20 DIAGNOSIS — D649 Anemia, unspecified: Secondary | ICD-10-CM | POA: Diagnosis not present

## 2020-04-20 DIAGNOSIS — K635 Polyp of colon: Secondary | ICD-10-CM

## 2020-04-20 DIAGNOSIS — D122 Benign neoplasm of ascending colon: Secondary | ICD-10-CM | POA: Diagnosis not present

## 2020-04-20 DIAGNOSIS — D128 Benign neoplasm of rectum: Secondary | ICD-10-CM | POA: Insufficient documentation

## 2020-04-20 DIAGNOSIS — D124 Benign neoplasm of descending colon: Secondary | ICD-10-CM | POA: Insufficient documentation

## 2020-04-20 DIAGNOSIS — J45909 Unspecified asthma, uncomplicated: Secondary | ICD-10-CM | POA: Diagnosis not present

## 2020-04-20 HISTORY — PX: COLONOSCOPY WITH PROPOFOL: SHX5780

## 2020-04-20 SURGERY — COLONOSCOPY WITH PROPOFOL
Anesthesia: General

## 2020-04-20 MED ORDER — PROPOFOL 10 MG/ML IV BOLUS
INTRAVENOUS | Status: DC | PRN
  Administered 2020-04-20: 70 mg via INTRAVENOUS

## 2020-04-20 MED ORDER — LIDOCAINE HCL (PF) 1 % IJ SOLN
INTRAMUSCULAR | Status: AC
  Filled 2020-04-20: qty 2

## 2020-04-20 MED ORDER — PROPOFOL 500 MG/50ML IV EMUL
INTRAVENOUS | Status: DC | PRN
  Administered 2020-04-20: 150 ug/kg/min via INTRAVENOUS

## 2020-04-20 MED ORDER — SODIUM CHLORIDE 0.9 % IV SOLN
INTRAVENOUS | Status: DC
  Administered 2020-04-20: 1000 mL via INTRAVENOUS

## 2020-04-20 MED ORDER — LIDOCAINE HCL (CARDIAC) PF 100 MG/5ML IV SOSY
PREFILLED_SYRINGE | INTRAVENOUS | Status: DC | PRN
  Administered 2020-04-20: 50 mg via INTRAVENOUS

## 2020-04-20 NOTE — Anesthesia Preprocedure Evaluation (Signed)
Anesthesia Evaluation  Patient identified by MRN, date of birth, ID band Patient awake    Reviewed: Allergy & Precautions, H&P , NPO status , Patient's Chart, lab work & pertinent test results, reviewed documented beta blocker date and time   Airway Mallampati: II   Neck ROM: full    Dental  (+) Poor Dentition   Pulmonary asthma , Current Smoker and Patient abstained from smoking.,    Pulmonary exam normal        Cardiovascular Exercise Tolerance: Poor negative cardio ROS Normal cardiovascular exam Rhythm:regular Rate:Normal     Neuro/Psych PSYCHIATRIC DISORDERS Depression negative neurological ROS     GI/Hepatic negative GI ROS, Neg liver ROS,   Endo/Other  negative endocrine ROS  Renal/GU negative Renal ROS  negative genitourinary   Musculoskeletal   Abdominal   Peds  Hematology  (+) Blood dyscrasia, anemia ,   Anesthesia Other Findings Past Medical History: No date: Anemia No date: Asthma No date: Back pain No date: Blood transfusion without reported diagnosis Past Surgical History: 2000: BACK SURGERY 04/02/2019: COLONOSCOPY WITH PROPOFOL; N/A     Comment:  Procedure: COLONOSCOPY WITH PROPOFOL;  Surgeon:               Virgel Manifold, MD;  Location: [facility] ENDOSCOPY;                Service: Endoscopy;  Laterality: N/A; 03/31/2019: ESOPHAGOGASTRODUODENOSCOPY; Left     Comment:  Procedure: ESOPHAGOGASTRODUODENOSCOPY (EGD);  Surgeon:               Virgel Manifold, MD;  Location: [facility]COPY;                Service: Endoscopy;  Laterality: Left; 04/02/2019: GIVENS CAPSULE STUDY; N/A     Comment:  Procedure: GIVENS CAPSULE STUDY;  Surgeon: Virgel Manifold, MD;  Location: [facility] ENDOSCOPY;  Service:               Endoscopy;  Laterality: N/A; 06/25/2019: XI ROBOT ASSISTED TRANSANAL RESECTION; N/A     Comment:  Procedure: XI ROBOT ASSISTED PARTIAL PROCTECTOMY OF               RECTAL MASS  USING TAMIS;  Surgeon: [name], MD;                Location: [facility] ORS;  Service: General;  Laterality: N/A; BMI    Body Mass Index: 19.51 kg/m     Reproductive/Obstetrics negative OB ROS                             Anesthesia Physical Anesthesia Plan  ASA: III  Anesthesia Plan: General   Post-op Pain Management:    Induction:   PONV Risk Score and Plan:   Airway Management Planned:   Additional Equipment:   Intra-op Plan:   Post-operative Plan:   Informed Consent: I have reviewed the patients History and Physical, chart, labs and discussed the procedure including the risks, benefits and alternatives for the proposed anesthesia with the patient or authorized representative who has indicated his/her understanding and acceptance.     Dental Advisory Given  Plan Discussed with: CRNA  Anesthesia Plan Comments:         Anesthesia Quick Evaluation

## 2020-04-20 NOTE — Op Note (Signed)
[facility] Gastroenterology Patient Name: Dajion Bickford Procedure Date: 04/20/2020 10:28 AM MRN: [id number] Account #: [id number] Date of Birth: 21-Feb-1951 Admit Type: Outpatient Age: 69 Room: [facility] ENDO ROOM 2 Gender: Male Note Status: Finalized Procedure:             Colonoscopy Indications:           Screening for colorectal malignant neoplasm, High risk                         colon cancer surveillance: Personal history of colonic                         polyps Providers:             Cleo Villamizar B. Bonna Gains MD, MD Referring MD:          [name] (Referring MD) Medicines:             Monitored Anesthesia Care Complications:         No immediate complications. Procedure:             Pre-Anesthesia Assessment:                        - ASA Grade Assessment: II - A patient with mild                         systemic disease.                        - Prior to the procedure, a History and Physical was                         performed, and patient medications, allergies and                         sensitivities were reviewed. The patient's tolerance                         of previous anesthesia was reviewed.                        - The risks and benefits of the procedure and the                         sedation options and risks were discussed with the                         patient. All questions were answered and informed                         consent was obtained.                        - Patient identification and proposed procedure were                         verified prior to the procedure by the physician, the                         nurse, the anesthesiologist, the anesthetist and the  technician. The procedure was verified in the                         procedure room.                        After obtaining informed consent, the colonoscope was                         passed under direct vision. Throughout the procedure,                          the patient's blood pressure, pulse, and oxygen                         saturations were monitored continuously. The                         Colonoscope was introduced through the anus and                         advanced to the the cecum, identified by appendiceal                         orifice and ileocecal valve. The colonoscopy was                         performed with ease. The patient tolerated the                         procedure well. The quality of the bowel preparation                         was good. Findings:      The perianal and digital rectal examinations were normal.      Ten sessile polyps were found in the sigmoid colon, descending colon,       transverse colon, ascending colon and cecum. The polyps were 5 to 10 mm       in size. These polyps were removed with a cold snare. Resection and       retrieval were complete. One was in the cecum, one in the ascending       colon, 5 in the transverse colon, one in the descending colon and 2 in       the sigmoid colon.      The exam was otherwise without abnormality.      The rectum, sigmoid colon, descending colon, transverse colon, ascending       colon and cecum appeared normal.      The retroflexed view of the distal rectum and anal verge was normal and       showed no anal or rectal abnormalities.      A 10 mm, non-bleeding polyp was found in the rectum. The polyp was       sessile. Polypectomy was attempted, initially using a cold snare. Polyp       resection was incomplete with this device. This intervention then       required a different device and polypectomy technique. The polyp was       removed with a cold biopsy forceps. Resection and retrieval were  complete. This polyp is likely residual polyp at the site of his rectal       surgery.      A 3 mm polyp was found in the rectum. The polyp was sessile. The polyp       was removed with a cold biopsy forceps. Resection and retrieval were        complete.      Retroflexion in the rectum was not performed due to Narrow rectum.       Careful frontal view of the rectum was otherwise normal.      A few diverticula were found in the entire colon. Impression:            - Ten 5 to 10 mm polyps in the sigmoid colon, in the                         descending colon, in the transverse colon, in the                         ascending colon and in the cecum, removed with a cold                         snare. Resected and retrieved.                        - The examination was otherwise normal.                        - The rectum, sigmoid colon, descending colon,                         transverse colon, ascending colon and cecum are normal.                        - The distal rectum and anal verge are normal on                         retroflexion view.                        - One 10 mm, non-bleeding polyp in the rectum, removed                         with a cold biopsy forceps. Resected and retrieved.                        - One 3 mm polyp in the rectum, removed with a cold                         biopsy forceps. Resected and retrieved. Recommendation:        - Discharge patient to home (with escort).                        - Advance diet as tolerated.                        - Continue present medications.                        -  Await pathology results.                        - Repeat colonoscopy date to be determined after                         pending pathology results are reviewed.                        - The findings and recommendations were discussed with                         the patient.                        - The findings and recommendations were discussed with                         the patient's family.                        - Return to primary care physician as previously                         scheduled. Procedure Code(s):     --- Professional ---                        [phone], Colonoscopy, flexible; with removal  of                         tumor(s), polyp(s), or other lesion(s) by snare                         technique                        45380, 3, Colonoscopy, flexible; with biopsy, single                         or multiple Diagnosis Code(s):     --- Professional ---                        Z12.11, Encounter for screening for malignant neoplasm                         of colon                        K63.5, Polyp of colon                        K62.1, Rectal polyp CPT copyright 2019 American Medical Association. All rights reserved. The codes documented in this report are preliminary and upon coder review may  be revised to meet current compliance requirements.  [name], MD Margretta Sidle B. Bonna Gains MD, MD 04/20/2020 11:42:39 AM This report has been signed electronically. Number of Addenda: 0 Note Initiated On: 04/20/2020 10:28 AM Scope Withdrawal Time: 0 hours 40 minutes 12 seconds  Total Procedure Duration: 0 hours 44 minutes 44 seconds  Estimated Blood Loss:  Estimated blood loss: none.      [facility]

## 2020-04-20 NOTE — H&P (Signed)
Roberto Antigua, MD [address] [place], [place], [place], [address] [address], [place], [place], [place], [address] Phone: [phone]  Fax: [phone]  Primary Care Physician:  [name], PA-C   Pre-Procedure History & Physical: HPI:  Roberto Collins is a 69 y.o. male is here for a colonoscopy.   Past Medical History:  Diagnosis Date  . Anemia   . Asthma   . Back pain   . Blood transfusion without reported diagnosis     Past Surgical History:  Procedure Laterality Date  . BACK SURGERY  2000  . COLONOSCOPY WITH PROPOFOL N/A 04/02/2019   Procedure: COLONOSCOPY WITH PROPOFOL;  Surgeon: Virgel Manifold, MD;  Location: [facility] ENDOSCOPY;  Service: Endoscopy;  Laterality: N/A;  . ESOPHAGOGASTRODUODENOSCOPY Left 03/31/2019   Procedure: ESOPHAGOGASTRODUODENOSCOPY (EGD);  Surgeon: Virgel Manifold, MD;  Location: [facility] ENDOSCOPY;  Service: Endoscopy;  Laterality: Left;  . GIVENS CAPSULE STUDY N/A 04/02/2019   Procedure: GIVENS CAPSULE STUDY;  Surgeon: Virgel Manifold, MD;  Location: [facility] ENDOSCOPY;  Service: Endoscopy;  Laterality: N/A;  . XI ROBOT ASSISTED TRANSANAL RESECTION N/A 06/25/2019   Procedure: XI ROBOT ASSISTED PARTIAL PROCTECTOMY OF RECTAL MASS USING TAMIS;  Surgeon: [name], MD;  Location: [facility] ORS;  Service: General;  Laterality: N/A;    Prior to Admission medications   Medication Sig Start Date End Date Taking? Authorizing Provider  chlorproMAZINE (THORAZINE) 100 MG tablet Take 300 mg by mouth at bedtime.     [provider]  Cholecalciferol (VITAMIN D) 50 MCG (2000 UT) CAPS Take 2,000 Units by mouth daily.    [provider]  cyclobenzaprine (FLEXERIL) 10 MG tablet Take 10 mg by mouth at bedtime.    [provider]  folic acid (FOLVITE) 1 MG tablet Take 1 tablet (1 mg total) by mouth daily. 04/04/19   [name], MD  gabapentin (NEURONTIN) 600 MG tablet Take 1,800 mg by mouth at bedtime.     [provider]    morphine (MS CONTIN) 60 MG 12 hr tablet Take 60 mg by mouth every 12 (twelve) hours.    [provider]  Na Sulfate-K Sulfate-Mg Sulf 17.5-3.13-1.6 GM/177ML SOLN At 5 PM the day before procedure take 1 bottle and 5 hours before procedure take 1 bottle. 04/06/20   Virgel Manifold, MD  polyethylene glycol-electrolytes (NULYTELY) 420 g solution Prepare according to package instructions. Starting at 5:00 PM: Drink one 8 oz glass of mixture every 15 minutes until you finish half of the jug. Five hours prior to procedure, drink 8 oz glass of mixture every 15 minutes until it is all gone. Make sure you do not drink anything 4 hours prior to your procedure. 04/07/20   Virgel Manifold, MD  pramipexole (MIRAPEX) 0.5 MG tablet Take by mouth. 02/19/13   [provider]  albuterol (VENTOLIN HFA) 108 (90 Base) MCG/ACT inhaler INHALE 2 PUFFS INTO THE LUNGS EVERY 4 (FOUR) HOURS AS NEEDED FOR WHEEZING OR SHORTNESS OF BREATH. 11/11/19   [name], PA-C    Allergies as of 04/19/2020 - Review Complete 04/19/2020  Allergen Reaction Noted  . Bee venom Anaphylaxis 04/04/2012  . Penicillins Anaphylaxis and Swelling 03/30/2019    Family History  Problem Relation Age of Onset  . Liver cancer Father     Social History   Socioeconomic History  . Marital status: Single    Spouse name: Not on file  . Number of children: 0  . Years of education: Not on file  .  Highest education level: Not on file  Occupational History  . Occupation: disabled  Tobacco Use  . Smoking status: Current Some Day Smoker    Packs/day: 0.50    Years: 10.00    Pack years: 5.00    Types: Cigarettes    Start date: 08/02/2009  . Smokeless tobacco: Never Used  . Tobacco comment: [place] smoking cessation program info provided  Vaping Use  . Vaping Use: Never used  Substance and Sexual Activity  . Alcohol use: Not Currently  . Drug use: Never  . Sexual activity: Not Currently  Other Topics Concern  . Not  on file  Social History Narrative   Self; [place]; used to roofing/ disabled; no children; smoke 1/2 ppd; currently quit.       Brother- 2 years younger; mom-alive.    Social Determinants of Health   Financial Resource Strain: Low Risk   . Difficulty of Paying Living Expenses: Not hard at all  Food Insecurity: No Food Insecurity  . Worried About Charity fundraiser in the Last Year: Never true  . Ran Out of Food in the Last Year: Never true  Transportation Needs: No Transportation Needs  . Lack of Transportation (Medical): No  . Lack of Transportation (Non-Medical): No  Physical Activity: Insufficiently Active  . Days of Exercise per Week: 2 days  . Minutes of Exercise per Session: 30 min  Stress: No Stress Concern Present  . Feeling of Stress : Not at all  Social Connections: Socially Isolated  . Frequency of Communication with Friends and Family: More than three times a week  . Frequency of Social Gatherings with Friends and Family: Once a week  . Attends Religious Services: Never  . Active Member of Clubs or Organizations: No  . Attends Archivist Meetings: Never  . Marital Status: Never married  Intimate Partner Violence: Not At Risk  . Fear of Current or Ex-Partner: No  . Emotionally Abused: No  . Physically Abused: No  . Sexually Abused: No    Review of Systems: See HPI, otherwise negative ROS  Physical Exam: BP 136/77   Pulse 85   Temp 97.7 F (36.5 C) (Tympanic)   Resp 16   Ht 5\' 10"  (1.778 m)   Wt 61.7 kg   SpO2 98%   BMI 19.51 kg/m  General:   Alert,  pleasant and cooperative in NAD Head:  Normocephalic and atraumatic. Neck:  Supple; no masses or thyromegaly. Lungs:  Clear throughout to auscultation, normal respiratory effort.    Heart:  +S1, +S2, Regular rate and rhythm, No edema. Abdomen:  Soft, nontender and nondistended. Normal bowel sounds, without guarding, and without rebound.   Neurologic:  Alert and  oriented x4;  grossly normal  neurologically.  Impression/Plan: [name] is here for a colonoscopy to be performed for history of polyps  Risks, benefits, limitations, and alternatives regarding  colonoscopy have been reviewed with the patient.  Questions have been answered.  All parties agreeable.   Virgel Manifold, MD  04/20/2020, 10:29 AM

## 2020-04-20 NOTE — Transfer of Care (Signed)
Immediate Anesthesia Transfer of Care Note  Patient: Roberto Collins  Procedure(s) Performed: COLONOSCOPY WITH PROPOFOL (N/A )  Patient Location: Endoscopy Unit  Anesthesia Type:General  Level of Consciousness: drowsy and patient cooperative  Airway & Oxygen Therapy: Patient Spontanous Breathing  Post-op Assessment: Report given to RN and Post -op Vital signs reviewed and stable  Post vital signs: Reviewed and stable  Last Vitals:  Vitals Value Taken Time  BP    Temp    Pulse 67 04/20/20 1131  Resp 9 04/20/20 1131  SpO2 100 % 04/20/20 1131  Vitals shown include unvalidated device data.  Last Pain:  Vitals:   04/20/20 1006  TempSrc: Tympanic  PainSc: 0-No pain         Complications: No complications documented.

## 2020-04-21 ENCOUNTER — Encounter: Payer: Self-pay | Admitting: Gastroenterology

## 2020-04-22 LAB — SURGICAL PATHOLOGY

## 2020-04-25 NOTE — Anesthesia Postprocedure Evaluation (Signed)
Anesthesia Post Note  Patient: Roberto Collins  Procedure(s) Performed: COLONOSCOPY WITH PROPOFOL (N/A )  Patient location during evaluation: PACU Anesthesia Type: General Level of consciousness: awake and alert Pain management: pain level controlled Vital Signs Assessment: post-procedure vital signs reviewed and stable Respiratory status: spontaneous breathing, nonlabored ventilation, respiratory function stable and patient connected to nasal cannula oxygen Cardiovascular status: blood pressure returned to baseline and stable Postop Assessment: no apparent nausea or vomiting Anesthetic complications: no   No complications documented.   Last Vitals:  Vitals:   04/20/20 1151 04/20/20 1159  BP: (!) 129/92 (!) 144/80  Pulse: 77 77  Resp: 16 (!) 22  Temp:    SpO2: 100% 100%    Last Pain:  Vitals:   04/20/20 1159  TempSrc:   PainSc: 0-No pain                 Molli Barrows

## 2020-05-05 ENCOUNTER — Encounter: Payer: Self-pay | Admitting: Gastroenterology

## 2020-06-07 ENCOUNTER — Ambulatory Visit: Payer: Medicare Other | Admitting: Gastroenterology

## 2020-12-22 ENCOUNTER — Ambulatory Visit: Payer: Medicare HMO | Attending: Neurology

## 2020-12-22 DIAGNOSIS — F5101 Primary insomnia: Secondary | ICD-10-CM | POA: Insufficient documentation

## 2020-12-22 DIAGNOSIS — G2581 Restless legs syndrome: Secondary | ICD-10-CM | POA: Diagnosis not present

## 2020-12-22 DIAGNOSIS — G4733 Obstructive sleep apnea (adult) (pediatric): Secondary | ICD-10-CM | POA: Diagnosis present

## 2020-12-23 ENCOUNTER — Other Ambulatory Visit: Payer: Self-pay

## 2021-02-02 ENCOUNTER — Ambulatory Visit: Payer: Medicare Other

## 2021-02-08 ENCOUNTER — Other Ambulatory Visit: Payer: Self-pay

## 2021-02-08 ENCOUNTER — Ambulatory Visit (INDEPENDENT_AMBULATORY_CARE_PROVIDER_SITE_OTHER): Payer: Medicare HMO | Admitting: Family Medicine

## 2021-02-08 ENCOUNTER — Encounter: Payer: Self-pay | Admitting: Family Medicine

## 2021-02-08 VITALS — BP 102/64 | HR 97 | Temp 97.8°F | Resp 16 | Ht 70.0 in | Wt 127.0 lb

## 2021-02-08 DIAGNOSIS — C20 Malignant neoplasm of rectum: Secondary | ICD-10-CM

## 2021-02-08 DIAGNOSIS — F5104 Psychophysiologic insomnia: Secondary | ICD-10-CM

## 2021-02-08 DIAGNOSIS — M545 Low back pain, unspecified: Secondary | ICD-10-CM | POA: Diagnosis not present

## 2021-02-08 DIAGNOSIS — J4520 Mild intermittent asthma, uncomplicated: Secondary | ICD-10-CM

## 2021-02-08 DIAGNOSIS — D500 Iron deficiency anemia secondary to blood loss (chronic): Secondary | ICD-10-CM

## 2021-02-08 DIAGNOSIS — J438 Other emphysema: Secondary | ICD-10-CM

## 2021-02-08 DIAGNOSIS — D696 Thrombocytopenia, unspecified: Secondary | ICD-10-CM

## 2021-02-08 DIAGNOSIS — I7090 Unspecified atherosclerosis: Secondary | ICD-10-CM

## 2021-02-08 DIAGNOSIS — F32A Depression, unspecified: Secondary | ICD-10-CM | POA: Diagnosis not present

## 2021-02-08 DIAGNOSIS — Z5181 Encounter for therapeutic drug level monitoring: Secondary | ICD-10-CM

## 2021-02-08 DIAGNOSIS — Z1159 Encounter for screening for other viral diseases: Secondary | ICD-10-CM

## 2021-02-08 DIAGNOSIS — E559 Vitamin D deficiency, unspecified: Secondary | ICD-10-CM

## 2021-02-08 DIAGNOSIS — I2510 Atherosclerotic heart disease of native coronary artery without angina pectoris: Secondary | ICD-10-CM

## 2021-02-08 DIAGNOSIS — G8929 Other chronic pain: Secondary | ICD-10-CM

## 2021-02-08 NOTE — Progress Notes (Signed)
Name: Roberto Collins   MRN: [id number]    DOB: [birth year]   Date:02/08/2021       Progress Note  Chief Complaint  Patient presents with  . Follow-up     Subjective:   Roberto Collins is a 70 y.o. male, presents to clinic for routine f/up  Dr. Luetta Nutting - pain doctor - back pain, fusion,  On morphine, gabapentin, muscle relaxer  Wt Readings from Last 5 Encounters:  02/08/21 127 lb (57.6 kg)  04/20/20 136 lb (61.7 kg)  04/05/20 129 lb 6.4 oz (58.7 kg)  02/02/20 134 lb 1.6 oz (60.8 kg)  08/03/19 132 lb (59.9 kg)   BMI Readings from Last 5 Encounters:  02/08/21 18.22 kg/m  04/20/20 19.51 kg/m  04/05/20 18.57 kg/m  02/02/20 19.24 kg/m  08/03/19 18.94 kg/m    Smoking hx 48 pack years, starting smoking 20's about 1 ppd, emphysema and hx of asthma, no respiratory sx, denies getting bronchitis  No weight loss   Current Outpatient Medications:  .  chlorproMAZINE (THORAZINE) 100 MG tablet, Take 300 mg by mouth at bedtime. , Disp: , Rfl:  .  Cholecalciferol (VITAMIN D) 50 MCG (2000 UT) CAPS, Take 2,000 Units by mouth daily., Disp: , Rfl:  .  cyclobenzaprine (FLEXERIL) 10 MG tablet, Take 10 mg by mouth at bedtime., Disp: , Rfl:  .  gabapentin (NEURONTIN) 600 MG tablet, Take 1,800 mg by mouth at bedtime. , Disp: , Rfl:  .  morphine (MS CONTIN) 60 MG 12 hr tablet, Take 60 mg by mouth every 12 (twelve) hours., Disp: , Rfl:  .  pramipexole (MIRAPEX) 0.5 MG tablet, Take by mouth., Disp: , Rfl:   Patient Active Problem List   Diagnosis Date Noted  . History of colonic polyps   . 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) 04/10/2019  . Iron deficiency anemia due to chronic blood loss 04/10/2019  . Polyp of colon   . Colon neoplasm   . Diverticulosis of large intestine without diverticulitis   . Chronic insomnia 04/04/2012  . Chronic low back pain 04/04/2012  . Depression 04/04/2012    Past Surgical  History:  Procedure Laterality Date  . BACK SURGERY  2000  . COLONOSCOPY WITH PROPOFOL N/A 04/02/2019   Procedure: COLONOSCOPY WITH PROPOFOL;  Surgeon: Virgel Manifold, MD;  Location: [facility] ENDOSCOPY;  Service: Endoscopy;  Laterality: N/A;  . COLONOSCOPY WITH PROPOFOL N/A 04/20/2020   Procedure: COLONOSCOPY WITH PROPOFOL;  Surgeon: Virgel Manifold, MD;  Location: [facility] ENDOSCOPY;  Service: Endoscopy;  Laterality: N/A;  . ESOPHAGOGASTRODUODENOSCOPY Left 03/31/2019   Procedure: ESOPHAGOGASTRODUODENOSCOPY (EGD);  Surgeon: Virgel Manifold, MD;  Location: [facility] ENDOSCOPY;  Service: Endoscopy;  Laterality: Left;  . GIVENS CAPSULE STUDY N/A 04/02/2019   Procedure: GIVENS CAPSULE STUDY;  Surgeon: Virgel Manifold, MD;  Location: [facility] ENDOSCOPY;  Service: Endoscopy;  Laterality: N/A;  . XI ROBOT ASSISTED TRANSANAL RESECTION N/A 06/25/2019   Procedure: XI ROBOT ASSISTED PARTIAL PROCTECTOMY OF RECTAL MASS USING TAMIS;  Surgeon: [name], MD;  Location: [facility]S;  Service: General;  Laterality: N/A;    Family History  Problem Relation Age of Onset  . Liver cancer Father     Social History   Tobacco Use  . Smoking status: Current Some Day Smoker    Packs/day: 0.50    Years: 10.00    Pack years: 5.00    Types: Cigarettes    Start date: 08/02/2009  .  Smokeless tobacco: Never Used  . Tobacco comment: [place] smoking cessation program info provided  Vaping Use  . Vaping Use: Never used  Substance Use Topics  . Alcohol use: Not Currently  . Drug use: Never     Allergies  Allergen Reactions  . Bee Venom Anaphylaxis  . Penicillins Anaphylaxis and Swelling    Did it involve swelling of the face/tongue/throat, SOB, or low BP? Yes Did it involve sudden or severe rash/hives, skin peeling, or any reaction on the inside of your mouth or nose? No Did you need to seek medical attention at a hospital or doctor's office? Yes When did it last happen?Many years ago If all above answers  are "NO", may proceed with cephalosporin use.     Health Maintenance  Topic Date Due  . Hepatitis C Screening  Never done  . COVID-19 Vaccine (1) 02/24/2021 (Originally 11/04/1962)  . PNA vac Low Risk Adult (1 of 2 - PCV13) 02/08/2022 (Originally 11/04/2017)  . COLONOSCOPY (Pts 45-106yrs Insurance coverage will need to be confirmed)  04/20/2021  . INFLUENZA VACCINE  05/01/2021  . HPV VACCINES  Aged Out  . TETANUS/TDAP  Discontinued    Chart Review Today: I personally reviewed active problem list, medication list, allergies, family history, social history, health maintenance, notes from last encounter, lab results, imaging with the patient/caregiver today.   Review of Systems  Constitutional: Negative.   HENT: Negative.   Eyes: Negative.   Respiratory: Negative.   Cardiovascular: Negative.   Gastrointestinal: Negative.   Endocrine: Negative.   Genitourinary: Negative.   Musculoskeletal: Negative.   Skin: Negative.   Allergic/Immunologic: Negative.   Neurological: Negative.   Hematological: Negative.   Psychiatric/Behavioral: Negative.   All other systems reviewed and are negative.    Objective:   Vitals:   02/08/21 1554  BP: 102/64  Pulse: 97  Resp: 16  Temp: 97.8 F (36.6 C)  SpO2: 98%  Weight: 127 lb (57.6 kg)  Height: 5\' 10"  (1.778 m)    Body mass index is 18.22 kg/m.  Physical Exam Vitals and nursing note reviewed.  Constitutional:      General: He is not in acute distress.    Appearance: Normal appearance. He is well-developed and well-groomed. He is not ill-appearing, toxic-appearing or diaphoretic.     Interventions: Face mask in place.     Comments: He appears thin  HENT:     Head: Normocephalic and atraumatic.     Jaw: No trismus.     Right Ear: External ear normal.     Left Ear: External ear normal.  Eyes:     General: Lids are normal. No scleral icterus.       Right eye: No discharge.        Left eye: No discharge.     Conjunctiva/sclera:  Conjunctivae normal.  Neck:     Trachea: Trachea and phonation normal. No tracheal deviation.  Cardiovascular:     Rate and Rhythm: Normal rate and regular rhythm.     Pulses: Normal pulses.          Radial pulses are 2+ on the right side and 2+ on the left side.       Posterior tibial pulses are 2+ on the right side and 2+ on the left side.     Heart sounds: Normal heart sounds. No murmur heard. No friction rub. No gallop.   Pulmonary:     Effort: Pulmonary effort is normal. No respiratory distress.  Breath sounds: Normal breath sounds. No stridor. No wheezing, rhonchi or rales.  Abdominal:     General: Bowel sounds are normal. There is no distension.     Palpations: Abdomen is soft.  Musculoskeletal:     Right lower leg: No edema.     Left lower leg: No edema.  Skin:    General: Skin is warm and dry.     Coloration: Skin is not jaundiced.     Findings: No rash.     Nails: There is no clubbing.  Neurological:     Mental Status: He is alert. Mental status is at baseline.     Cranial Nerves: No dysarthria or facial asymmetry.     Motor: No tremor or abnormal muscle tone.     Gait: Gait normal.  Psychiatric:        Mood and Affect: Mood normal.        Speech: Speech normal.        Behavior: Behavior normal. Behavior is cooperative.         Assessment & Plan:   Patient is a 70 year old male presents for routine follow-up he has not been in for almost a year and a half    ICD-10-CM   1. Chronic low back pain, unspecified back pain laterality, unspecified whether sciatica present  M54.50    G89.29    Refill on muscle relaxers, encouraged other conservative management  2. Iron deficiency anemia due to chronic blood loss  D50.0 CBC with Differential/Platelet   Prior labs show anemia recheck labs  3. Chronic insomnia  F51.04   4. Depression, unspecified depression type  F32.A   5. Thrombocytopenia (HCC)  D69.6 CBC with Differential/Platelet  6. Atherosclerosis  I70.90  COMPLETE METABOLIC PANEL WITH GFR    Lipid panel   Encourage patient to start a statin medication with his history, recheck lipid panel today  7. Coronary artery disease involving native heart without angina pectoris, unspecified vessel or lesion type  L93.79 COMPLETE METABOLIC PANEL WITH GFR    Lipid panel   He denies any symptoms  8. Other emphysema ([place])  J43.8    He denies respiratory symptoms, current smoker, slight increased AP diameter and slight diminished breath sounds throughout no wheeze rhonchi or rales  9. Mild intermittent asthma without complication  K24.09    History of asthma denies any current triggers or need for daily inhalers  10. Encounter for medication monitoring  Z51.81 CBC with Differential/Platelet    COMPLETE METABOLIC PANEL WITH GFR    Lipid panel    Hepatitis C antibody    VITAMIN D 25 Hydroxy (Vit-D Deficiency, Fractures)  11. Vitamin D deficiency  B35.3 COMPLETE METABOLIC PANEL WITH GFR    VITAMIN D 25 Hydroxy (Vit-D Deficiency, Fractures)   Recheck and adjust supplements as needed  12. Encounter for hepatitis C screening test for low risk patient  Z11.59 Hepatitis C antibody    HCV RNA, Quantitative Real Time PCR  13. 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HCC) Chronic C20    He follows with specialist, GI and oncology   6 month f/up  [name], PA-C 02/08/21 4:12 PM

## 2021-02-12 LAB — CBC WITH DIFFERENTIAL/PLATELET
Absolute Monocytes: 425 cells/uL (ref 200–950)
Basophils Absolute: 60 cells/uL (ref 0–200)
Basophils Relative: 1.2 %
Eosinophils Absolute: 50 cells/uL (ref 15–500)
Eosinophils Relative: 1 %
HCT: 45.5 % (ref 38.5–50.0)
Hemoglobin: 15.3 g/dL (ref 13.2–17.1)
Lymphs Abs: 1645 cells/uL (ref 850–3900)
MCH: 30.4 pg (ref 27.0–33.0)
MCHC: 33.6 g/dL (ref 32.0–36.0)
MCV: 90.3 fL (ref 80.0–100.0)
MPV: 10.4 fL (ref 7.5–12.5)
Monocytes Relative: 8.5 %
Neutro Abs: 2820 cells/uL (ref 1500–7800)
Neutrophils Relative %: 56.4 %
Platelets: 245 10*3/uL (ref 140–400)
RBC: 5.04 10*6/uL (ref 4.20–5.80)
RDW: 13.1 % (ref 11.0–15.0)
Total Lymphocyte: 32.9 %
WBC: 5 10*3/uL (ref 3.8–10.8)

## 2021-02-12 LAB — LIPID PANEL
Cholesterol: 159 mg/dL
HDL: 45 mg/dL
LDL Cholesterol (Calc): 93 mg/dL
Non-HDL Cholesterol (Calc): 114 mg/dL
Total CHOL/HDL Ratio: 3.5 (calc)
Triglycerides: 109 mg/dL

## 2021-02-12 LAB — COMPLETE METABOLIC PANEL WITHOUT GFR
AG Ratio: 1.7 (calc) (ref 1.0–2.5)
ALT: 17 U/L (ref 9–46)
AST: 18 U/L (ref 10–35)
Albumin: 4.5 g/dL (ref 3.6–5.1)
Alkaline phosphatase (APISO): 95 U/L (ref 35–144)
BUN: 15 mg/dL (ref 7–25)
CO2: 28 mmol/L (ref 20–32)
Calcium: 9.4 mg/dL (ref 8.6–10.3)
Chloride: 99 mmol/L (ref 98–110)
Creat: 1.03 mg/dL (ref 0.70–1.25)
GFR, Est African American: 86 mL/min/1.73m2
GFR, Est Non African American: 74 mL/min/1.73m2
Globulin: 2.7 g/dL (ref 1.9–3.7)
Glucose, Bld: 85 mg/dL (ref 65–99)
Potassium: 4.6 mmol/L (ref 3.5–5.3)
Sodium: 136 mmol/L (ref 135–146)
Total Bilirubin: 0.4 mg/dL (ref 0.2–1.2)
Total Protein: 7.2 g/dL (ref 6.1–8.1)

## 2021-02-12 LAB — VITAMIN D 25 HYDROXY (VIT D DEFICIENCY, FRACTURES): Vit D, 25-Hydroxy: 38 ng/mL (ref 30–100)

## 2021-02-12 LAB — HCV RNA,QUANTITATIVE REAL TIME PCR
HCV Quantitative Log: <1.18 {Log_IU}/mL
HCV RNA, PCR, QN: <15 [IU]/mL

## 2021-02-12 LAB — HEPATITIS C ANTIBODY
Hepatitis C Ab: REACTIVE — AB
SIGNAL TO CUT-OFF: 11.9 — ABNORMAL HIGH

## 2021-02-14 ENCOUNTER — Other Ambulatory Visit: Payer: Self-pay | Admitting: Family Medicine

## 2021-02-14 MED ORDER — ROSUVASTATIN CALCIUM 5 MG PO TABS
5.0000 mg | ORAL_TABLET | Freq: Every day | ORAL | 3 refills | Status: DC
Start: 2021-02-14 — End: 2021-11-17

## 2021-02-17 ENCOUNTER — Encounter: Payer: Self-pay | Admitting: Family Medicine

## 2021-02-21 ENCOUNTER — Other Ambulatory Visit: Payer: Self-pay

## 2021-02-21 ENCOUNTER — Ambulatory Visit (INDEPENDENT_AMBULATORY_CARE_PROVIDER_SITE_OTHER): Payer: Medicare HMO

## 2021-02-21 VITALS — BP 112/72 | HR 102 | Temp 98.2°F | Resp 15 | Ht 70.0 in | Wt 125.8 lb

## 2021-02-21 DIAGNOSIS — Z0000 Encounter for general adult medical examination without abnormal findings: Secondary | ICD-10-CM | POA: Diagnosis not present

## 2021-02-21 NOTE — Progress Notes (Signed)
Subjective:   Roberto Collins is a 70 y.o. male who presents for Medicare Annual/Subsequent preventive examination.  Review of Systems     Cardiac Risk Factors include: advanced age (>31men, >41 women);dyslipidemia;male gender;smoking/ tobacco exposure     Objective:    Today's Vitals   02/21/21 1040  BP: 112/72  Pulse: (!) 102  Resp: 15  Temp: 98.2 F (36.8 C)  TempSrc: Oral  SpO2: 96%  Weight: 125 lb 12.8 oz (57.1 kg)  Height: 5\' 10"  (1.778 m)   Body mass index is 18.05 kg/m.  Advanced Directives 02/21/2021 04/19/2020 02/02/2020 06/25/2019 04/10/2019 03/30/2019 03/30/2019  Does Patient Have a Medical Advance Directive? No No No No No No No  Would patient like information on creating a medical advance directive? No - Patient declined - No - Patient declined No - Patient declined Yes (Inpatient - patient requests chaplain consult to create a medical advance directive) Yes (Inpatient - patient requests chaplain consult to create a medical advance directive) Yes (Inpatient - patient requests chaplain consult to create a medical advance directive)    Current Medications (verified) Outpatient Encounter Medications as of 02/21/2021  Medication Sig  . chlorproMAZINE (THORAZINE) 100 MG tablet Take 300 mg by mouth at bedtime.   . Cholecalciferol (VITAMIN D) 50 MCG (2000 UT) CAPS Take 2,000 Units by mouth daily.  . cyclobenzaprine (FLEXERIL) 10 MG tablet Take 10 mg by mouth at bedtime.  . gabapentin (NEURONTIN) 600 MG tablet Take 1,800 mg by mouth at bedtime.   Marland Kitchen morphine (MS CONTIN) 60 MG 12 hr tablet Take 60 mg by mouth every 12 (twelve) hours.  . pramipexole (MIRAPEX) 0.5 MG tablet Take by mouth.  . rosuvastatin (CRESTOR) 5 MG tablet Take 1 tablet (5 mg total) by mouth at bedtime. (Patient not taking: Reported on 02/21/2021)   No facility-administered encounter medications on file as of 02/21/2021.    Allergies (verified) Bee venom and Penicillins   History: Past Medical History:   Diagnosis Date  . Anemia   . Asthma   . Back pain   . Blood transfusion without reported diagnosis    Past Surgical History:  Procedure Laterality Date  . BACK SURGERY  2000  . COLONOSCOPY WITH PROPOFOL N/A 04/02/2019   Procedure: COLONOSCOPY WITH PROPOFOL;  Surgeon: Virgel Manifold, MD;  Location: [facility] ENDOSCOPY;  Service: Endoscopy;  Laterality: N/A;  . COLONOSCOPY WITH PROPOFOL N/A 04/20/2020   Procedure: COLONOSCOPY WITH PROPOFOL;  Surgeon: Virgel Manifold, MD;  Location: [facility] ENDOSCOPY;  Service: Endoscopy;  Laterality: N/A;  . ESOPHAGOGASTRODUODENOSCOPY Left 03/31/2019   Procedure: ESOPHAGOGASTRODUODENOSCOPY (EGD);  Surgeon: Virgel Manifold, MD;  Location: [facility] ENDOSCOPY;  Service: Endoscopy;  Laterality: Left;  . GIVENS CAPSULE STUDY N/A 04/02/2019   Procedure: GIVENS CAPSULE STUDY;  Surgeon: Virgel Manifold, MD;  Location: [facility] ENDOSCOPY;  Service: Endoscopy;  Laterality: N/A;  . XI ROBOT ASSISTED TRANSANAL RESECTION N/A 06/25/2019   Procedure: XI ROBOT ASSISTED PARTIAL PROCTECTOMY OF RECTAL MASS USING TAMIS;  Surgeon: [name], MD;  Location: [facility]S;  Service: General;  Laterality: N/A;   Family History  Problem Relation Age of Onset  . Liver cancer Father    Social History   Socioeconomic History  . Marital status: Single    Spouse name: Not on file  . Number of children: 0  . Years of education: Not on file  . Highest education level: Not on file  Occupational History  . Occupation: disabled  Tobacco Use  . Smoking status:  Current Some Day Smoker    Packs/day: 1.00    Years: 15.00    Pack years: 15.00    Types: Cigarettes    Start date: 08/02/2009  . Smokeless tobacco: Never Used  . Tobacco comment: [place] smoking cessation program info provided  Vaping Use  . Vaping Use: Never used  Substance and Sexual Activity  . Alcohol use: Not Currently  . Drug use: Never  . Sexual activity: Not Currently  Other Topics Concern  . Not on file   Social History Narrative   Lives alone; used to roofing/ disabled; no children   Social Determinants of Health   Financial Resource Strain: Low Risk   . Difficulty of Paying Living Expenses: Not hard at all  Food Insecurity: No Food Insecurity  . Worried About Charity fundraiser in the Last Year: Never true  . Ran Out of Food in the Last Year: Never true  Transportation Needs: No Transportation Needs  . Lack of Transportation (Medical): No  . Lack of Transportation (Non-Medical): No  Physical Activity: Insufficiently Active  . Days of Exercise per Week: 3 days  . Minutes of Exercise per Session: 30 min  Stress: No Stress Concern Present  . Feeling of Stress : Not at all  Social Connections: Socially Isolated  . Frequency of Communication with Friends and Family: More than three times a week  . Frequency of Social Gatherings with Friends and Family: Once a week  . Attends Religious Services: Never  . Active Member of Clubs or Organizations: No  . Attends Archivist Meetings: Never  . Marital Status: Never married    Tobacco Counseling Ready to quit: No Counseling given: Yes Comment: [place] smoking cessation program info provided   Clinical Intake:  Pre-visit preparation completed: Yes  Pain : No/denies pain     BMI - recorded: 18.05 Nutritional Status: BMI <19  Underweight Nutritional Risks: None Diabetes: No  How often do you need to have someone help you when you read instructions, pamphlets, or other written materials from your doctor or pharmacy?: 1 - Never    Interpreter Needed?: No  Information entered by :: Clemetine Marker LPN   Activities of Daily Living In your present state of health, do you have any difficulty performing the following activities: 02/21/2021 02/08/2021  Hearing? N N  Comment declines hearing aids -  Vision? N Y  Difficulty concentrating or making decisions? N N  Walking or climbing stairs? N N  Dressing or bathing? N  N  Doing errands, shopping? N N  Preparing Food and eating ? N -  Using the Toilet? N -  In the past six months, have you accidently leaked urine? N -  Do you have problems with loss of bowel control? N -  Managing your Medications? N -  Managing your Finances? N -  Housekeeping or managing your Housekeeping? N -  Some recent data might be hidden    Patient Care Team: [name], PA-C as PCP - General (Family Medicine) Virgel Manifold, MD as Consulting Physician (Gastroenterology) Luetta Nutting Sharyn Dross, MD as Referring Physician (Pain Medicine)  Indicate any recent Medical Services you may have received from other than [facility] providers in the past year (date may be approximate).     Assessment:   This is a routine wellness examination for [place].  Hearing/Vision screen  Hearing Screening   125Hz  250Hz  500Hz  1000Hz  2000Hz  3000Hz  4000Hz  6000Hz  8000Hz   Right ear:  Left ear:           Comments: Pt denies hearing difficulty  Vision Screening Comments: Annual eye exams done at eye provider in [place] issues and exercise activities discussed: Current Exercise Habits: Home exercise routine, Type of exercise: walking, Time (Minutes): 30, Frequency (Times/Week): 3, Weekly Exercise (Minutes/Week): 90, Intensity: Mild, Exercise limited by: orthopedic condition(s)  Goals Addressed            This Visit's Progress   . Quit Smoking       If you wish to quit smoking, help is available. For free tobacco cessation program offerings call the [facility] at (706)150-5793 or Live Well Line at 360-530-2751. You may also visit [email or web address] or email [email or web address] for more information on other programs.        Depression Screen PHQ 2/9 Scores 02/21/2021 02/08/2021 02/02/2020 08/03/2019  PHQ - 2 Score 0 0 0 0  PHQ- 9 Score - 0 - 0    Fall Risk Fall Risk  02/21/2021 02/08/2021 02/02/2020 08/03/2019  Falls in the past year? 0 0 0 0  Number  falls in past yr: 0 0 0 0  Injury with Fall? 0 0 0 0  Risk for fall due to : No Fall Risks - No Fall Risks -  Follow up Falls prevention discussed - Falls prevention discussed -    FALL RISK PREVENTION PERTAINING TO THE HOME:  Any stairs in or around the home? Yes  If so, are there any without handrails? No  Home free of loose throw rugs in walkways, pet beds, electrical cords, etc? Yes  Adequate lighting in your home to reduce risk of falls? Yes   ASSISTIVE DEVICES UTILIZED TO PREVENT FALLS:  Life alert? No  Use of a cane, walker or w/c? No  Grab bars in the bathroom? No  Shower chair or bench in shower? No  Elevated toilet seat or a handicapped toilet? No   TIMED UP AND GO:  Was the test performed? Yes .  Length of time to ambulate 10 feet: 4 sec.   Gait steady and fast without use of assistive device  Cognitive Function:     6CIT Screen 02/21/2021 02/02/2020  What Year? 0 points 0 points  What month? 0 points 0 points  What time? 0 points 0 points  Count back from 20 0 points 0 points  Months in reverse 4 points 4 points  Repeat phrase 4 points 10 points  Total Score 8 14    Immunizations There is no immunization history for the selected administration types on file for this patient.  TDAP status: Due, Education has been provided regarding the importance of this vaccine. Advised may receive this vaccine at local pharmacy or Health Dept. Aware to provide a copy of the vaccination record if obtained from local pharmacy or Health Dept. Verbalized acceptance and understanding.  Flu Vaccine status: Declined, Education has been provided regarding the importance of this vaccine but patient still declined. Advised may receive this vaccine at local pharmacy or Health Dept. Aware to provide a copy of the vaccination record if obtained from local pharmacy or Health Dept. Verbalized acceptance and understanding.  Pneumococcal vaccine status: Declined,  Education has been provided  regarding the importance of this vaccine but patient still declined. Advised may receive this vaccine at local pharmacy or Health Dept. Aware to provide a copy of the vaccination record if obtained from local pharmacy or Health Dept. Verbalized acceptance and  understanding.   Covid-19 vaccine status: Declined, Education has been provided regarding the importance of this vaccine but patient still declined. Advised may receive this vaccine at local pharmacy or Health Dept.or vaccine clinic. Aware to provide a copy of the vaccination record if obtained from local pharmacy or Health Dept. Verbalized acceptance and understanding.  Qualifies for Shingles Vaccine? Yes   Zostavax completed No   Shingrix Completed?: No.    Education has been provided regarding the importance of this vaccine. Patient has been advised to call insurance company to determine out of pocket expense if they have not yet received this vaccine. Advised may also receive vaccine at local pharmacy or Health Dept. Verbalized acceptance and understanding.  Screening Tests Health Maintenance  Topic Date Due  . COVID-19 Vaccine (1) 02/24/2021 (Originally 11/04/1962)  . PNA vac Low Risk Adult (1 of 2 - PCV13) 02/08/2022 (Originally 11/04/2017)  . COLONOSCOPY (Pts 45-35yrs Insurance coverage will need to be confirmed)  04/20/2021  . INFLUENZA VACCINE  05/01/2021  . Hepatitis C Screening  Completed  . HPV VACCINES  Aged Out  . TETANUS/TDAP  Discontinued    Health Maintenance  There are no preventive care reminders to display for this patient.  Colorectal cancer screening: Type of screening: Colonoscopy. Completed 04/20/20. Repeat every 1 years  Lung Cancer Screening: (Low Dose CT Chest recommended if Age 41-80 years, 30 pack-year currently smoking OR have quit w/in 15years.) does qualify. Pt declined.   Additional Screening:  Hepatitis C Screening: does qualify; Completed 02/08/21  Vision Screening: Recommended annual ophthalmology  exams for early detection of glaucoma and other disorders of the eye. Is the patient up to date with their annual eye exam?  Yes  Who is the provider or what is the name of the office in which the patient attends annual eye exams? Eye provider in [place] Screening: Recommended annual dental exams for proper oral hygiene  Community Resource Referral / Chronic Care Management: CRR required this visit?  No   CCM required this visit?  No      Plan:     I have personally reviewed and noted the following in the patient's chart:   . Medical and social history . Use of alcohol, tobacco or illicit drugs  . Current medications and supplements including opioid prescriptions. Patient is currently taking opioid prescriptions. Information provided to patient regarding non-opioid alternatives. Patient advised to discuss non-opioid treatment plan with their provider. . Functional ability and status . Nutritional status . Physical activity . Advanced directives . List of other physicians . Hospitalizations, surgeries, and ER visits in previous 12 months . Vitals . Screenings to include cognitive, depression, and falls . Referrals and appointments  In addition, I have reviewed and discussed with patient certain preventive protocols, quality metrics, and best practice recommendations. A written personalized care plan for preventive services as well as general preventive health recommendations were provided to patient.     Clemetine Marker, LPN   3/00/9233   Nurse Notes: pt aware of lab results and scheduled for follow up 05/11/21; pt also notified to pick up crestor at [place].

## 2021-02-21 NOTE — Patient Instructions (Signed)
Roberto Collins , Thank you for taking time to come for your Medicare Wellness Visit. I appreciate your ongoing commitment to your health goals. Please review the following plan we discussed and let me know if I can assist you in the future.   Screening recommendations/referrals: Colonoscopy: done 04/20/20; please contact [facility] to schedule your follow up colonoscopy at [phone] Recommended yearly ophthalmology/optometry visit for glaucoma screening and checkup Recommended yearly dental visit for hygiene and checkup  Vaccinations: Influenza vaccine: declined Pneumococcal vaccine: declined Tdap vaccine: due Shingles vaccine: Shingrix discussed. Please contact your pharmacy for coverage information.  Covid-19: declined  Advanced directives: Advance directive discussed with you today. Even though you declined this today please call our office should you change your mind and we can give you the proper paperwork for you to fill out.  Conditions/risks identified: If you wish to quit smoking, help is available. For free tobacco cessation program offerings call the [facility] at (574)666-3491 or Live Well Line at 517 612 5180. You may also visit [email or web address] or email [email or web address] for more information on other programs.   Next appointment: Follow up in one year for your annual wellness visit.  Preventive Care 23 Years and Older, Male Preventive care refers to lifestyle choices and visits with your health care provider that can promote health and wellness. What does preventive care include?  A yearly physical exam. This is also called an annual well check.  Dental exams once or twice a year.  Routine eye exams. Ask your health care provider how often you should have your eyes checked.  Personal lifestyle choices, including:  Daily care of your teeth and gums.  Regular physical activity.  Eating a healthy diet.  Avoiding tobacco and  drug use.  Limiting alcohol use.  Practicing safe sex.  Taking low doses of aspirin every day.  Taking vitamin and mineral supplements as recommended by your health care provider. What happens during an annual well check? The services and screenings done by your health care provider during your annual well check will depend on your age, overall health, lifestyle risk factors, and family history of disease. Counseling  Your health care provider may ask you questions about your:  Alcohol use.  Tobacco use.  Drug use.  Emotional well-being.  Home and relationship well-being.  Sexual activity.  Eating habits.  History of falls.  Memory and ability to understand (cognition).  Work and work Statistician. Screening  You may have the following tests or measurements:  Height, weight, and BMI.  Blood pressure.  Lipid and cholesterol levels. These may be checked every 5 years, or more frequently if you are over 81 years old.  Skin check.  Lung cancer screening. You may have this screening every year starting at age 67 if you have a 30-pack-year history of smoking and currently smoke or have quit within the past 15 years.  Fecal occult blood test (FOBT) of the stool. You may have this test every year starting at age 75.  Flexible sigmoidoscopy or colonoscopy. You may have a sigmoidoscopy every 5 years or a colonoscopy every 10 years starting at age 37.  Prostate cancer screening. Recommendations will vary depending on your family history and other risks.  Hepatitis C blood test.  Hepatitis B blood test.  Sexually transmitted disease (STD) testing.  Diabetes screening. This is done by checking your blood sugar (glucose) after you have not eaten for a while (fasting). You may have this done every 1-3 years.  Abdominal aortic aneurysm (AAA) screening. You may need this if you are a current or former smoker.  Osteoporosis. You may be screened starting at age 66 if you are  at high risk. Talk with your health care provider about your test results, treatment options, and if necessary, the need for more tests. Vaccines  Your health care provider may recommend certain vaccines, such as:  Influenza vaccine. This is recommended every year.  Tetanus, diphtheria, and acellular pertussis (Tdap, Td) vaccine. You may need a Td booster every 10 years.  Zoster vaccine. You may need this after age 56.  Pneumococcal 13-valent conjugate (PCV13) vaccine. One dose is recommended after age 18.  Pneumococcal polysaccharide (PPSV23) vaccine. One dose is recommended after age 63. Talk to your health care provider about which screenings and vaccines you need and how often you need them. This information is not intended to replace advice given to you by your health care provider. Make sure you discuss any questions you have with your health care provider. Document Released: 10/14/2015 Document Revised: 06/06/2016 Document Reviewed: 07/19/2015 Elsevier Interactive Patient Education  2017 [place] Prevention in the Home Falls can cause injuries. They can happen to people of all ages. There are many things you can do to make your home safe and to help prevent falls. What can I do on the outside of my home?  Regularly fix the edges of walkways and driveways and fix any cracks.  Remove anything that might make you trip as you walk through a door, such as a raised step or threshold.  Trim any bushes or trees on the path to your home.  Use bright outdoor lighting.  Clear any walking paths of anything that might make someone trip, such as rocks or tools.  Regularly check to see if handrails are loose or broken. Make sure that both sides of any steps have handrails.  Any raised decks and porches should have guardrails on the edges.  Have any leaves, snow, or ice cleared regularly.  Use sand or salt on walking paths during winter.  Clean up any spills in your garage  right away. This includes oil or grease spills. What can I do in the bathroom?  Use night lights.  Install grab bars by the toilet and in the tub and shower. Do not use towel bars as grab bars.  Use non-skid mats or decals in the tub or shower.  If you need to sit down in the shower, use a plastic, non-slip stool.  Keep the floor dry. Clean up any water that spills on the floor as soon as it happens.  Remove soap buildup in the tub or shower regularly.  Attach bath mats securely with double-sided non-slip rug tape.  Do not have throw rugs and other things on the floor that can make you trip. What can I do in the bedroom?  Use night lights.  Make sure that you have a light by your bed that is easy to reach.  Do not use any sheets or blankets that are too big for your bed. They should not hang down onto the floor.  Have a firm chair that has side arms. You can use this for support while you get dressed.  Do not have throw rugs and other things on the floor that can make you trip. What can I do in the kitchen?  Clean up any spills right away.  Avoid walking on wet floors.  Keep items that you use  a lot in easy-to-reach places.  If you need to reach something above you, use a strong step stool that has a grab bar.  Keep electrical cords out of the way.  Do not use floor polish or wax that makes floors slippery. If you must use wax, use non-skid floor wax.  Do not have throw rugs and other things on the floor that can make you trip. What can I do with my stairs?  Do not leave any items on the stairs.  Make sure that there are handrails on both sides of the stairs and use them. Fix handrails that are broken or loose. Make sure that handrails are as long as the stairways.  Check any carpeting to make sure that it is firmly attached to the stairs. Fix any carpet that is loose or worn.  Avoid having throw rugs at the top or bottom of the stairs. If you do have throw rugs,  attach them to the floor with carpet tape.  Make sure that you have a light switch at the top of the stairs and the bottom of the stairs. If you do not have them, ask someone to add them for you. What else can I do to help prevent falls?  Wear shoes that:  Do not have high heels.  Have rubber bottoms.  Are comfortable and fit you well.  Are closed at the toe. Do not wear sandals.  If you use a stepladder:  Make sure that it is fully opened. Do not climb a closed stepladder.  Make sure that both sides of the stepladder are locked into place.  Ask someone to hold it for you, if possible.  Clearly mark and make sure that you can see:  Any grab bars or handrails.  First and last steps.  Where the edge of each step is.  Use tools that help you move around (mobility aids) if they are needed. These include:  Canes.  Walkers.  Scooters.  Crutches.  Turn on the lights when you go into a dark area. Replace any light bulbs as soon as they burn out.  Set up your furniture so you have a clear path. Avoid moving your furniture around.  If any of your floors are uneven, fix them.  If there are any pets around you, be aware of where they are.  Review your medicines with your doctor. Some medicines can make you feel dizzy. This can increase your chance of falling. Ask your doctor what other things that you can do to help prevent falls. This information is not intended to replace advice given to you by your health care provider. Make sure you discuss any questions you have with your health care provider. Document Released: 07/14/2009 Document Revised: 02/23/2016 Document Reviewed: 10/22/2014 Elsevier Interactive Patient Education  2017 [employer]    Managing Pain Without Opioids Opioids are strong medicines used to treat moderate to severe pain. For some people, especially those who have long-term (chronic) pain, opioids may not be the best choice for pain management due  to:  Side effects like nausea, constipation, and sleepiness.  The risk of addiction (opioid use disorder). The longer you take opioids, the greater your risk of addiction. Pain that lasts for more than 3 months is called chronic pain. Managing chronic pain usually requires more than one approach and is often provided by a team of health care providers working together (multidisciplinary approach). Pain management may be done at a pain management center or pain clinic.  Types of pain management without opioids Managing pain without opioids can involve:  Non-opioid medicines.  Exercises to help relieve pain and improve strength and range of motion (physical therapy).  Therapy to help with everyday tasks and activities (occupational therapy).  Therapy to help you find ways to relieve pain by doing things you enjoy (recreational therapy).  Talk therapy (psychotherapy) and other mental health therapies.  Medical treatments such as injections or devices.  Making lifestyle changes. Pain management options Non-opioid medicines Non-opioid medicines for pain may include medicines taken by mouth (oral medicines), such as:  Over-the-counter or prescription NSAIDs. These may be the first medicines used for pain. They work well for muscle and bone pain, and they reduce swelling.  Acetaminophen. This over-the-counter medicine may work well for milder pain but not swelling.  Antidepressants. These may be used to treat chronic pain. A certain type of antidepressant (tricyclics) is often used. These medicines are given in lower doses for pain than when used for depression.  Anticonvulsants. These are usually used to treat seizures but may also reduce nerve (neuropathic) pain.  Muscle relaxants. These relieve pain caused by sudden muscle tightening (spasms). You may also use a type of pain medicine that is applied to the skin as a patch, cream, or gel (topical analgesic), such as a numbing medicine.  These may cause fewer side effects than oral medicines. Therapy Physical therapy involves doing exercises to gain strength and flexibility. A physical therapist may teach you exercises to move and stretch parts of your body that are weak, stiff, or painful. You can learn these exercises at physical therapy visits and practice them at home. Physical therapy may also involve:  Massage.  Heat wraps or applying heat or cold to affected areas.  Sending electrical signals through the skin to interrupt pain signals (transcutaneous electrical nerve stimulation, TENS).  Sending weak lasers through the skin to reduce pain and swelling (low-level laser therapy).  Using signals from your body to help you learn to regulate pain (biofeedback). Occupational therapy helps you learn ways to function at home and work with less pain. Recreational therapy may involve trying new activities or hobbies, such as drawing or a physical activity. Types of mental health therapy for pain include:  Cognitive behavioral therapy (CBT) to help you learn coping skills for dealing with pain.  Acceptance and commitment therapy (ACT) to change the way you think and react to pain.  Relaxation therapies, including muscle relaxation exercises and focusing your mind on the present moment to lower stress (mindfulness-based stress reduction).  Pain management counseling. This may be individual, family, or group counseling.   Medical treatments Medical treatments for pain management include:  Nerve block injections. These may include a pain blocker and anti-inflammatory medicines. You may have injections: ? Near the spine to relieve chronic back or neck pain. ? Into joints to relieve back or joint pain. ? Into nerve areas that supply a painful area to relieve body pain. ? Into muscles (trigger point injections) to relieve some painful muscle conditions.  A medical device placed near your spine to help block pain signals and  relieve nerve pain or chronic back pain (spinal cord stimulation device).  Acupuncture. Follow these instructions at home Medicines  Take over-the-counter and prescription medicines only as told by your health care provider.  If you are taking pain medicine, ask your health care providers about possible side effects to watch out for.  Do not drive or use heavy machinery while taking prescription  pain medicine. Lifestyle  Do not use drugs or alcohol to reduce pain. Limit alcohol intake to no more than 1 drink a day for nonpregnant women and 2 drinks a day for men. One drink equals 12 oz of beer, 5 oz of wine, or 1 oz of hard liquor.  Do not use any products that contain nicotine or tobacco, such as cigarettes and e-cigarettes. These can delay healing. If you need help quitting, ask your health care provider.  Eat a healthy diet and maintain a healthy weight. Poor diet and excess weight may make pain worse. ? Eat foods that are high in fiber. These include fresh fruits and vegetables, whole grains, and beans. ? Limit foods that are high in fat and processed sugars, such as fried and sweet foods.  Exercise regularly. Exercise lowers stress and may help relieve pain. ? Ask your health care provider what activities and exercises are safe for you. ? If your health care provider approves, join an exercise class that combines movement and stress reduction. Examples include yoga and tai chi.  Get enough sleep. Lack of sleep may make pain worse.  Lower stress as much as possible. Practice stress reduction techniques as told by your therapist.   General instructions  Work with all your pain management providers to find the treatments that work best for you. You are an important member of your pain management team. There are many things you can do to reduce pain on your own.  Consider joining an online or in-person support group for people who have chronic pain.  Keep all follow-up visits as  told by your health care providers. This is important. Where to find more information You can find more information about managing pain without opioids from:  American Academy of Pain Medicine: painmed.[place] for Chronic Pain: instituteforchronicpain.org  American Chronic Pain Association: theacpa.org Contact a health care provider if:  You have side effects from pain medicine.  Your pain gets worse or does not get better with treatments or home care.  You are struggling with anxiety or depression. Summary  Many types of pain can be managed without opioids. Chronic pain may respond better to pain management without opioids.  Pain is best managed with a team of providers working together.  Pain management without opioids may include non-opioid medicines, medical treatments, physical therapy, mental health therapy, and lifestyle changes.  Tell your health care providers if your pain gets worse or is not being managed well enough. This information is not intended to replace advice given to you by your health care provider. Make sure you discuss any questions you have with your health care provider. Document Revised: 06/30/2020 Document Reviewed: 06/30/2020 Elsevier Patient Education  [place].

## 2021-05-11 ENCOUNTER — Other Ambulatory Visit: Payer: Self-pay

## 2021-05-11 ENCOUNTER — Encounter: Payer: Self-pay | Admitting: Family Medicine

## 2021-05-11 ENCOUNTER — Ambulatory Visit (INDEPENDENT_AMBULATORY_CARE_PROVIDER_SITE_OTHER): Payer: Medicare HMO | Admitting: Family Medicine

## 2021-05-11 VITALS — BP 102/68 | HR 96 | Temp 97.7°F | Resp 18 | Ht 70.0 in | Wt 125.0 lb

## 2021-05-11 DIAGNOSIS — F17200 Nicotine dependence, unspecified, uncomplicated: Secondary | ICD-10-CM

## 2021-05-11 DIAGNOSIS — Z9189 Other specified personal risk factors, not elsewhere classified: Secondary | ICD-10-CM

## 2021-05-11 DIAGNOSIS — J439 Emphysema, unspecified: Secondary | ICD-10-CM

## 2021-05-11 DIAGNOSIS — I700 Atherosclerosis of aorta: Secondary | ICD-10-CM

## 2021-05-11 DIAGNOSIS — I2510 Atherosclerotic heart disease of native coronary artery without angina pectoris: Secondary | ICD-10-CM | POA: Diagnosis not present

## 2021-05-11 DIAGNOSIS — Z1211 Encounter for screening for malignant neoplasm of colon: Secondary | ICD-10-CM

## 2021-05-11 DIAGNOSIS — I7090 Unspecified atherosclerosis: Secondary | ICD-10-CM | POA: Insufficient documentation

## 2021-05-11 DIAGNOSIS — F17210 Nicotine dependence, cigarettes, uncomplicated: Secondary | ICD-10-CM | POA: Insufficient documentation

## 2021-05-11 DIAGNOSIS — Z5181 Encounter for therapeutic drug level monitoring: Secondary | ICD-10-CM | POA: Diagnosis not present

## 2021-05-11 LAB — LIPID PANEL
Cholesterol: 109 mg/dL (ref <200)
HDL: 41 mg/dL (ref >=40)
LDL Cholesterol (Calc): 50 mg/dL (calc)
Non-HDL Cholesterol (Calc): 68 mg/dL (calc) (ref <130)
Total CHOL/HDL Ratio: 2.7 (calc) (ref <5.0)
Triglycerides: 99 mg/dL (ref <150)

## 2021-05-11 LAB — COMPLETE METABOLIC PANEL WITH GFR
AG Ratio: 1.7 (calc) (ref 1.0–2.5)
ALT: 20 U/L (ref 9–46)
AST: 21 U/L (ref 10–35)
Albumin: 4.3 g/dL (ref 3.6–5.1)
Alkaline phosphatase (APISO): 91 U/L (ref 35–144)
BUN: 12 mg/dL (ref 7–25)
CO2: 27 mmol/L (ref 20–32)
Calcium: 9.4 mg/dL (ref 8.6–10.3)
Chloride: 100 mmol/L (ref 98–110)
Creat: 0.9 mg/dL (ref 0.70–1.35)
Globulin: 2.5 g/dL (calc) (ref 1.9–3.7)
Glucose, Bld: 90 mg/dL (ref 65–99)
Potassium: 4.6 mmol/L (ref 3.5–5.3)
Sodium: 134 mmol/L — ABNORMAL LOW (ref 135–146)
Total Bilirubin: 0.7 mg/dL (ref 0.2–1.2)
Total Protein: 6.8 g/dL (ref 6.1–8.1)
eGFR: 93 mL/min/{1.73_m2} (ref >=60)

## 2021-05-11 NOTE — Progress Notes (Signed)
Name: Roberto Collins   MRN: [id number]    DOB: 1950-10-23   Date:05/11/2021       Progress Note  Chief Complaint  Patient presents with   Hyperlipidemia     Subjective:   Roberto Collins is a 70 y.o. male, presents to clinic for follow-up on hyperlipidemia after starting statin medication 3 months ago  Hyperlipidemia: Currently treated with Crestor 5 mg, pt reports good med compliance, no side effects myalgias, fatigue after starting medicines taking daily Last Lipids: Lab Results  Component Value Date   CHOL 159 02/08/2021   HDL 45 02/08/2021   LDLCALC 93 02/08/2021   TRIG 109 02/08/2021   CHOLHDL 3.5 02/08/2021   - Denies: Chest pain, shortness of breath, myalgias, claudication The 10-year ASCVD risk score Mikey Bussing DC Jr., et al., 2013) is: 12.8%   Values used to calculate the score:     Age: 40 years     Sex: Male     Is Non-Hispanic African American: No     Diabetic: No     Tobacco smoker: Yes     Systolic Blood Pressure: [id number] mmHg     Is BP treated: No     HDL Cholesterol: 45 mg/dL     Total Cholesterol: 159 mg/dL Known atherosclerotic disease to abdominal and thoracic aorta and coronary arteries per prior imaging   Current smoker with significant smoking history, patient smokes about 1 pack/day and has smoked on and off for more than 30 to 40 years he is interested in doing lung cancer screening low-dose CT, he denies chronic cough, frequent bronchitis, hemoptysis, unintentional weight loss, lymphadenopathy, chest pain, wheeze, shortness of breath He agrees to referral to lung cancer screening team  Weights have been stable over the past several months he did lose some weight over the past year about 4 to 11 pounds   Wt Readings from Last 5 Encounters:  05/11/21 125 lb (56.7 kg)  02/21/21 125 lb 12.8 oz (57.1 kg)  02/08/21 127 lb (57.6 kg)  04/20/20 136 lb (61.7 kg)  04/05/20 129 lb 6.4 oz (58.7 kg)   BMI Readings from Last 5 Encounters:  05/11/21 17.94 kg/m   02/21/21 18.05 kg/m  02/08/21 18.22 kg/m  04/20/20 19.51 kg/m  04/05/20 18.57 kg/m   Pain management per specialist on Flexeril, gabapentin and morphine    Current Outpatient Medications:    chlorproMAZINE (THORAZINE) 100 MG tablet, Take 300 mg by mouth at bedtime. , Disp: , Rfl:    Cholecalciferol (VITAMIN D) 50 MCG (2000 UT) CAPS, Take 2,000 Units by mouth daily., Disp: , Rfl:    cyclobenzaprine (FLEXERIL) 10 MG tablet, Take 10 mg by mouth at bedtime., Disp: , Rfl:    gabapentin (NEURONTIN) 600 MG tablet, Take 1,800 mg by mouth at bedtime. 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  06/25/2019 06/25/2019   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[place]) 04/10/2019   Iron deficiency anemia due to chronic blood loss 04/10/2019   Polyp of colon    Colon neoplasm    Diverticulosis of large intestine without diverticulitis    Chronic insomnia 04/04/2012   Chronic low back pain 04/04/2012   Depression 04/04/2012    Past Surgical History:  Procedure Laterality Date   BACK SURGERY  2000   COLONOSCOPY WITH PROPOFOL N/A 04/02/2019   Procedure: COLONOSCOPY WITH PROPOFOL;  Surgeon: Virgel Manifold, MD;  Location: [facility] ENDOSCOPY;  Service: Endoscopy;  Laterality: N/A;   COLONOSCOPY WITH PROPOFOL N/A 04/20/2020   Procedure: COLONOSCOPY WITH PROPOFOL;  Surgeon: Virgel Manifold, MD;  Location: [facility]  ENDOSCOPY;  Service: Endoscopy;  Laterality: N/A;   ESOPHAGOGASTRODUODENOSCOPY Left 03/31/2019   Procedure: ESOPHAGOGASTRODUODENOSCOPY (EGD);  Surgeon: Virgel Manifold, MD;  Location: [facility] ENDOSCOPY;  Service: Endoscopy;  Laterality: Left;   GIVENS CAPSULE STUDY N/A 04/02/2019   Procedure: GIVENS CAPSULE STUDY;  Surgeon: Virgel Manifold, MD;  Location: [facility] ENDOSCOPY;  Service: Endoscopy;  Laterality: N/A;   XI ROBOT ASSISTED TRANSANAL RESECTION N/A 06/25/2019   Procedure: XI ROBOT ASSISTED PARTIAL PROCTECTOMY OF RECTAL MASS USING TAMIS;  Surgeon: [name], MD;  Location: [facility] ORS;  Service: General;  Laterality: N/A;    Family History  Problem Relation Age of Onset   Liver cancer Father     Social History   Tobacco Use   Smoking status: Some Days    Packs/day: 1.00    Years: 15.00    Pack years: 15.00    Types: Cigarettes    Start date: 08/02/2009   Smokeless tobacco: Never   Tobacco comments:    [place] smoking cessation program info provided  Vaping Use   Vaping Use: Never used  Substance Use Topics   Alcohol use: Not Currently   Drug use: Never     Allergies  Allergen Reactions   Bee Venom Anaphylaxis   Penicillins Anaphylaxis and Swelling    Did it involve swelling of the face/tongue/throat, SOB, or low BP? Yes Did it involve sudden or severe rash/hives, skin peeling, or any reaction on the inside of your mouth or nose? No Did you need to seek medical attention at a hospital or doctor's office? Yes When did it last happen?  Many years ago If all above answers are "NO", may proceed with cephalosporin use.     Health Maintenance  Topic Date Due   COLONOSCOPY (Pts 45-73yr Insurance coverage will need to be confirmed)  04/20/2021   COVID-19 Vaccine (1) 05/27/2021 (Originally 11/04/1955)   INFLUENZA VACCINE  06/27/2021 (Originally 05/01/2021)   Zoster Vaccines- Shingrix (1 of 2) 08/11/2021 (Originally 11/05/1971)   PNA vac Low Risk Adult (1 of 2 - PCV13)  02/08/2022 (Originally 11/04/2017)   Hepatitis C Screening  Completed   HPV VACCINES  Aged Out   TETANUS/TDAP  Discontinued    Chart Review Today: I personally reviewed active problem list, medication list, allergies, family history, social history, health maintenance, notes from last encounter, lab results, imaging with the patient/caregiver today.   Review of Systems  Constitutional: Negative.   HENT: Negative.    Eyes: Negative.   Respiratory: Negative.    Cardiovascular: Negative.   Gastrointestinal: Negative.   Endocrine: Negative.   Genitourinary: Negative.   Musculoskeletal: Negative.   Skin: Negative.   Allergic/Immunologic: Negative.   Neurological: Negative.   Hematological: Negative.   Psychiatric/Behavioral: Negative.    All other systems  reviewed and are negative.   Objective:   Vitals:   05/11/21 1602  BP: 102/68  Pulse: 96  Resp: 18  Temp: 97.7 F (36.5 C)  SpO2: 96%  Weight: 125 lb (56.7 kg)  Height: '5\' 10"'$  (1.778 m)    Body mass index is 17.94 kg/m.  Physical Exam Vitals and nursing note reviewed.  Constitutional:      General: He is not in acute distress.    Appearance: Normal appearance. He is well-developed and well-groomed. He is not ill-appearing, toxic-appearing or diaphoretic.     Interventions: Face mask in place.     Comments: He appears thin  HENT:     Head: Normocephalic and atraumatic.     Jaw: No trismus.     Right Ear: External ear normal.     Left Ear: External ear normal.  Eyes:     General: Lids are normal. No scleral icterus.       Right eye: No discharge.        Left eye: No discharge.     Conjunctiva/sclera: Conjunctivae normal.  Neck:     Trachea: Trachea and phonation normal. No tracheal deviation.  Cardiovascular:     Rate and Rhythm: Normal rate and regular rhythm.     Pulses: Normal pulses.          Radial pulses are 2+ on the right side and 2+ on the left side.     Heart sounds: Heart sounds are distant. No  murmur heard.   No friction rub. No gallop.  Pulmonary:     Effort: Pulmonary effort is normal. No tachypnea, accessory muscle usage, respiratory distress or retractions.     Breath sounds: Normal breath sounds. Decreased air movement present. No stridor or transmitted upper airway sounds. No wheezing, rhonchi or rales.     Comments: BS hard to hear but present Increased AP diameter Abdominal:     General: Bowel sounds are normal. There is no distension.     Palpations: Abdomen is soft.  Musculoskeletal:     Right lower leg: No edema.     Left lower leg: No edema.  Skin:    General: Skin is warm and dry.     Coloration: Skin is not jaundiced.     Findings: No rash.     Nails: There is no clubbing.  Neurological:     Mental Status: He is alert. Mental status is at baseline.     Cranial Nerves: No dysarthria or facial asymmetry.     Motor: No tremor or abnormal muscle tone.     Gait: Gait normal.  Psychiatric:        Mood and Affect: Mood normal.        Speech: Speech normal.        Behavior: Behavior normal. Behavior is cooperative.        Assessment & Plan:     ICD-10-CM   1. Aortic atherosclerosis (HCC)  [id number] COMPLETE METABOLIC PANEL WITH GFR    Lipid panel   Abdominal and thoracic, started statin medication and tolerating    2. Encounter for medication monitoring  [id number] COMPLETE METABOLIC PANEL WITH GFR    Lipid panel   Monitoring lipids and LFTs with start of new statin medicine    3. Coronary artery disease involving native coronary artery of native heart without angina pectoris  I25.10 Lipid panel   On statin, tolerating, CAD on CT scan, denies any exertional symptoms no prior cardiac work-up per patient  4. Candidate for statin therapy due to risk of future cardiovascular event  [id number] COMPLETE METABOLIC PANEL WITH GFR    Lipid panel   Agreed to statin, will reevaluate effectiveness and monitor    5. Screening for malignant neoplasm of colon  Z12.11  Ambulatory referral to Gastroenterology   Overdue for follow-up with [facility], Dr. Bonna Gains    6. Smoking greater than 30 pack years  F17.210 Ambulatory Referral Lung Cancer Screening [facility]   More than 30 to 40-year smoking    7. Current every day smoker  F17.200 Ambulatory Referral Lung Cancer Screening [facility]   Discussed smoking cessation, offered resources    8. Pulmonary emphysema, unspecified emphysema type ([place])  J43.9 Ambulatory Referral Lung Cancer Screening [facility]   Noted on CT, denies any frequent bronchitis, chronic cough, dyspnea on exertion     Smoking cessation instruction/counseling given:  counseled patient on the dangers of tobacco use, advised patient to stop smoking, and reviewed strategies to maximize success   Return in about 6 months (around 11/11/2021) for Routine follow-up.   [name], PA-C 05/11/21 4:24 PM

## 2021-05-11 NOTE — Patient Instructions (Signed)
Lung Cancer Screening A lung cancer screening is a test that checks for lung cancer. Lung cancer screening is done to look for lung cancer in its very early stages when you are not likely to have any symptoms and before it spreads beyond the lung, making it harder to treat. Finding cancer early improves the chances of successfultreatment. It may save your life. Who should have screening? You should be screened for lung cancer if all of these apply: You currently smoke or you have quit smoking within the past 15 years. You are 47-64 years old. Screening may be recommended up to age 76 depending on your overall health and other factors. You are in good general health. You have a smoking history of 1 pack of cigarettes a day for 20 years or 2 packs a day for 10 years. Screening may also be recommended if you are at high risk for the disease. You may be at high risk if: You have a family history of lung cancer. You have been exposed to asbestos or radon. You have chronic obstructive pulmonary disease (COPD). How is screening done?  The recommended screening test is a low-dose computed tomography (LDCT) scan. This scan takes detailed images of the lungs. This allows a health care provider to look for abnormal cells. If you are at risk for lung cancer, it is recommended that you get screened once a year. Talk to your health careprovider about the risks, benefits, and limitations of screening. What are the benefits of screening? Screening can find lung cancer early, before symptoms start and before it has spread outside of the lungs. The chances of curing lung cancer are greater ifthe cancer is diagnosed early. What are the risks of screening? The screening may show lung cancer when no cancer is present (false-positive result). The screening may not find lung cancer when it is present. The person gets exposed to radiation. How can I lower my risk of lung cancer? Make these lifestyle changes to lower  your risk of developing lung cancer: Do not use any products that contain nicotine or tobacco, such as cigarettes, e-cigarettes, and chewing tobacco. If you need help quitting, ask your health care provider. Avoid secondhand smoke. Avoid exposure to radiation. Avoid exposure to radon gas. Have your home checked for radon regularly. Avoid things that cause cancer (carcinogens). Avoid living or working in places with high air pollution. Questions to ask your health care provider Am I eligible for lung cancer screening? Does my health insurance cover the cost of lung cancer screening? What happens if the lung cancer screening shows something of concern? How soon will I have results from my lung cancer screening? Is there anything that I need to do to prepare for my lung cancer screening? What happens if I decide not to have lung cancer screening? Where to find more information Ask your health care provider about the risks and benefits of screening. More information and resources are available from these organizations: [place] (ACS): www.cancer.org American Lung Association: www.lung.org Contact a health care provider if: You start to show symptoms of lung cancer, including: Coughing that will not go away. Making whistling sounds when you breathe (wheezing). Chest pain. Coughing up blood. Shortness of breath. Weight loss that cannot be explained. Constant tiredness (fatigue). Hoarse voice. Summary Lung cancer screening may find lung cancer before symptoms appear. Finding cancer early improves the chances of successful treatment. It may save your life. The recommended screening test is a low-dose computed tomography (LDCT)  scan that looks for abnormal cells in the lungs. If you are at risk for lung cancer, it is recommended that you get screened once a year. You can make lifestyle changes to lower your risk of lung cancer. Ask your health care provider about the risks and  benefits of screening. This information is not intended to replace advice given to you by your health care provider. Make sure you discuss any questions you have with your healthcare provider. Document Revised: 09/15/2020 Document Reviewed: 09/15/2019 Elsevier Patient Education  2022 [employer].

## 2021-05-16 ENCOUNTER — Telehealth: Payer: Self-pay

## 2021-05-16 NOTE — Telephone Encounter (Signed)
Copied from [place] [id number]. Topic: General - Call Back - No Documentation >> May 16, 2021 11:25 AM Erick Blinks wrote: Reason for CRM: Pt returned call for lab results, please advise if [facility] may disclose.  Best contact: [phone]

## 2021-05-16 NOTE — Telephone Encounter (Signed)
Pt.notified

## 2021-06-07 ENCOUNTER — Other Ambulatory Visit (INDEPENDENT_AMBULATORY_CARE_PROVIDER_SITE_OTHER): Payer: Self-pay

## 2021-06-07 ENCOUNTER — Telehealth: Payer: Self-pay

## 2021-06-07 DIAGNOSIS — Z1211 Encounter for screening for malignant neoplasm of colon: Secondary | ICD-10-CM

## 2021-06-07 DIAGNOSIS — Z86010 Personal history of colonic polyps: Secondary | ICD-10-CM

## 2021-06-07 MED ORDER — CLENPIQ 10-3.5-12 MG-GM -GM/160ML PO SOLN
1.0000 | ORAL | 0 refills | Status: DC
Start: 2021-06-07 — End: 2021-11-17

## 2021-06-07 NOTE — Progress Notes (Signed)
Gastroenterology Pre-Procedure Review  Request Date: 07/28/2021 Requesting Physician: Dr. Bonna Gains  PATIENT REVIEW QUESTIONS: The patient responded to the following health history questions as indicated:    1. Are you having any GI issues? no 2. Do you have a personal history of Polyps? yes (last colonoscopy) 3. Do you have a family history of Colon Cancer or Polyps? no 4. Diabetes Mellitus? no 5. Joint replacements in the past 12 months?no 6. Major health problems in the past 3 months?no 7. Any artificial heart valves, MVP, or defibrillator?no    MEDICATIONS & ALLERGIES:    Patient reports the following regarding taking any anticoagulation/antiplatelet therapy:   Plavix, Coumadin, Eliquis, Xarelto, Lovenox, Pradaxa, Brilinta, or Effient? no Aspirin? no  Patient confirms/reports the following medications:  Current Outpatient Medications  Medication Sig Dispense Refill   chlorproMAZINE (THORAZINE) 100 MG tablet Take 300 mg by mouth at bedtime.      Cholecalciferol (VITAMIN D) 50 MCG (2000 UT) CAPS Take 2,000 Units by mouth daily.     cyclobenzaprine (FLEXERIL) 10 MG tablet Take 10 mg by mouth at bedtime.     gabapentin (NEURONTIN) 600 MG tablet Take 1,800 mg by mouth at bedtime.      morphine (MS CONTIN) 60 MG 12 hr tablet Take 60 mg by mouth every 12 (twelve) hours.     pramipexole (MIRAPEX) 0.5 MG tablet Take by mouth.     rosuvastatin (CRESTOR) 5 MG tablet Take 1 tablet (5 mg total) by mouth at bedtime. 90 tablet 3   No current facility-administered medications for this visit.    Patient confirms/reports the following allergies:  Allergies  Allergen Reactions   Bee Venom Anaphylaxis   Penicillins Anaphylaxis and Swelling    Did it involve swelling of the face/tongue/throat, SOB, or low BP? Yes Did it involve sudden or severe rash/hives, skin peeling, or any reaction on the inside of your mouth or nose? No Did you need to seek medical attention at a hospital or doctor's  office? Yes When did it last happen?  Many years ago If all above answers are "NO", may proceed with cephalosporin use.     No orders of the defined types were placed in this encounter.   AUTHORIZATION INFORMATION Primary Insurance: 1D#: Group #:  Secondary Insurance: 1D#: Group #:  SCHEDULE INFORMATION: Date: 07/28/2021 Time: Location:[facility]

## 2021-06-07 NOTE — Telephone Encounter (Signed)
Pt. Ready to be scheduled for colonoscopy

## 2021-06-19 ENCOUNTER — Other Ambulatory Visit: Payer: Self-pay | Admitting: *Deleted

## 2021-06-19 DIAGNOSIS — F17210 Nicotine dependence, cigarettes, uncomplicated: Secondary | ICD-10-CM

## 2021-06-19 DIAGNOSIS — Z87891 Personal history of nicotine dependence: Secondary | ICD-10-CM

## 2021-06-20 ENCOUNTER — Telehealth: Payer: Self-pay | Admitting: Acute Care

## 2021-06-20 NOTE — Telephone Encounter (Signed)
Rodena Piety this is one of yours

## 2021-06-21 NOTE — Telephone Encounter (Signed)
I just tried calling the patient to reschedule his CT. His voice mailbox has not been setup yet and I couldn;t leave him a message

## 2021-06-22 NOTE — Telephone Encounter (Signed)
I just tried calling the patient again to reschedule his CT. His voice mailbox has not been setup yet and I couldn;t leave him a message

## 2021-06-23 NOTE — Telephone Encounter (Signed)
I just tried calling the patient again to reschedule his CT. His voice mailbox has not been setup yet and I couldn;t leave him a message

## 2021-07-03 ENCOUNTER — Ambulatory Visit (INDEPENDENT_AMBULATORY_CARE_PROVIDER_SITE_OTHER): Payer: Medicare HMO | Admitting: Acute Care

## 2021-07-03 ENCOUNTER — Other Ambulatory Visit: Payer: Self-pay

## 2021-07-03 ENCOUNTER — Encounter: Payer: Self-pay | Admitting: Acute Care

## 2021-07-03 DIAGNOSIS — F17210 Nicotine dependence, cigarettes, uncomplicated: Secondary | ICD-10-CM

## 2021-07-03 NOTE — Patient Instructions (Signed)
Thank you for participating in the [facility]. It was our pleasure to meet you today. We will call you with the results of your scan within the next few days. Your scan will be assigned a Lung RADS category score by the physicians reading the scans.  This Lung RADS score determines follow up scanning.  See below for description of categories, and follow up screening recommendations. We will be in touch to schedule your follow up screening annually or based on recommendations of our providers. We will fax a copy of your scan results to your Primary Care Physician, or the physician who referred you to the program, to ensure they have the results. Please call the office if you have any questions or concerns regarding your scanning experience or results.  Our office number is [phone]. Please speak with Denise Phelps, RN. She is our Lung Cancer Screening RN. If she is unavailable when you call, please have the office staff send her a message. She will return your call at her earliest convenience. Remember, if your scan is normal, we will scan you annually as long as you continue to meet the criteria for the program. (Age 55-77, Current smoker or smoker who has quit within the last 15 years). If you are a smoker, remember, quitting is the single most powerful action that you can take to decrease your risk of lung cancer and other pulmonary, breathing related problems. We know quitting is hard, and we are here to help.  Please let us know if there is anything we can do to help you meet your goal of quitting. If you are a former smoker, congratulations. We are proud of you! Remain smoke free! Remember you can refer friends or family members through the number above.  We will screen them to make sure they meet criteria for the program. Thank you for helping us take better care of you by participating in Lung Screening.  Lung RADS Categories:  Lung RADS 1: no nodules  or definitely non-concerning nodules.  Recommendation is for a repeat annual scan in 12 months.  Lung RADS 2:  nodules that are non-concerning in appearance and behavior with a very low likelihood of becoming an active cancer. Recommendation is for a repeat annual scan in 12 months.  Lung RADS 3: nodules that are probably non-concerning , includes nodules with a low likelihood of becoming an active cancer.  Recommendation is for a 6-month repeat screening scan. Often noted after an upper respiratory illness. We will be in touch to make sure you have no questions, and to schedule your 6-month scan.  Lung RADS 4 A: nodules with concerning findings, recommendation is most often for a follow up scan in 3 months or additional testing based on our provider's assessment of the scan. We will be in touch to make sure you have no questions and to schedule the recommended 3 month follow up scan.  Lung RADS 4 B:  indicates findings that are concerning. We will be in touch with you to schedule additional diagnostic testing based on our provider's  assessment of the scan.   

## 2021-07-03 NOTE — Progress Notes (Signed)
Virtual Visit via Telephone Note  I connected with Roberto Collins on 07/03/21 at  4:30 PM EDT by telephone and verified that I am speaking with the correct person using two identifiers.  Location: Patient: At home Provider:  [address], [place], [place], [address]    I discussed the limitations, risks, security and privacy concerns of performing an evaluation and management service by telephone and the availability of in person appointments. I also discussed with the patient that there may be a patient responsible charge related to this service. The patient expressed understanding and agreed to proceed.   Shared Decision Making Visit Lung Cancer Screening Program [phone])   Eligibility: Age 70 y.o. Pack Years Smoking History Calculation 37 pack year  (# packs/per year x # years smoked) Recent History of coughing up blood  no Unexplained weight loss? no ( >Than 15 pounds within the last 6 months ) Prior History Lung / other cancer no (Diagnosis within the last 5 years already requiring surveillance chest CT Scans). Smoking Status Current Smoker Former Smokers: Years since quit: NA  Quit Date: NA  Visit Components: Discussion included one or more decision making aids. yes Discussion included risk/benefits of screening. yes Discussion included potential follow up diagnostic testing for abnormal scans. yes Discussion included meaning and risk of over diagnosis. yes Discussion included meaning and risk of False Positives. yes Discussion included meaning of total radiation exposure. yes  Counseling Included: Importance of adherence to annual lung cancer LDCT screening. yes Impact of comorbidities on ability to participate in the program. yes Ability and willingness to under diagnostic treatment. yes  Smoking Cessation Counseling: Current Smokers:  Discussed importance of smoking cessation. yes Information about tobacco cessation classes and interventions provided to  patient. yes Patient provided with "ticket" for LDCT Scan. yes Symptomatic Patient. no  Counseling Diagnosis Code: Tobacco Use Z72.0 Asymptomatic Patient yes  Counseling (Intermediate counseling: > three minutes counseling) Y5035 Former Smokers:  Discussed the importance of maintaining cigarette abstinence. yes Diagnosis Code: Personal History of Nicotine Dependence. W65.681 Information about tobacco cessation classes and interventions provided to patient. Yes Patient provided with "ticket" for LDCT Scan. yes Written Order for Lung Cancer Screening with LDCT placed in Epic. Yes (CT Chest Lung Cancer Screening Low Dose W/O CM) [id number] Z12.2-Screening of respiratory organs Z87.891-Personal history of nicotine dependence   I have spent 25 minutes of face to face time with Roberto Collins  discussing the risks and benefits of lung cancer screening. We viewed a power point together that explained in detail the above noted topics. We paused at intervals to allow for questions to be asked and answered to ensure understanding.We discussed that the single most powerful action that he can take to decrease his risk of developing lung cancer is to quit smoking. We discussed whether or not he is ready to commit to setting a quit date. We discussed options for tools to aid in quitting smoking including nicotine replacement therapy, non-nicotine medications, support groups, Quit Smart classes, and behavior modification. We discussed that often times setting smaller, more achievable goals, such as eliminating 1 cigarette a day for a week and then 2 cigarettes a day for a week can be helpful in slowly decreasing the number of cigarettes smoked. This allows for a sense of accomplishment as well as providing a clinical benefit. I gave him the " Be Stronger Than Your Excuses" card with contact information for community resources, classes, free nicotine replacement therapy, and access to mobile apps, text  messaging, and  on-line smoking cessation help. I have also given him my card and contact information in the event he needs to contact me. We discussed the time and location of the scan, and that either Doroteo Glassman RN or I will call with the results within 24-48 hours of receiving them. I have offered he  a copy of the power point we viewed  as a resource in the event they need reinforcement of the concepts we discussed today in the office. The patient verbalized understanding of all of  the above and had no further questions upon leaving the office. They have my contact information in the event they have any further questions.  I spent 3-4 minutes counseling on smoking cessation and the health risks of continued tobacco abuse.  I explained to the patient that there has been a high incidence of coronary artery disease noted on these exams. I explained that this is a non-gated exam therefore degree or severity cannot be determined. This patient is on statin therapy. I have asked the patient to follow-up with their PCP regarding any incidental finding of coronary artery disease and management with diet or medication as their PCP  feels is clinically indicated. The patient verbalized understanding of the above and had no further questions upon completion of the visit.     Magdalen Spatz, NP 07/03/2021

## 2021-07-05 ENCOUNTER — Ambulatory Visit: Payer: Medicare HMO

## 2021-07-13 ENCOUNTER — Telehealth: Payer: Self-pay | Admitting: Acute Care

## 2021-07-14 NOTE — Telephone Encounter (Signed)
DP did you try calling the pt yesterday?  Thanks

## 2021-07-14 NOTE — Telephone Encounter (Signed)
Spoke with pt and scheduled lung screening CT at [facility] for 07/19/21 10:00. PT verbalized understanding. Nothing further needed.

## 2021-07-19 ENCOUNTER — Ambulatory Visit
Admission: RE | Admit: 2021-07-19 | Discharge: 2021-07-19 | Disposition: A | Discharge status: Home / Self Care | Payer: Medicare HMO | Source: Ambulatory Visit | Attending: Acute Care | Admitting: Acute Care

## 2021-07-19 ENCOUNTER — Other Ambulatory Visit: Payer: Self-pay

## 2021-07-19 DIAGNOSIS — F17210 Nicotine dependence, cigarettes, uncomplicated: Secondary | ICD-10-CM | POA: Diagnosis not present

## 2021-07-19 DIAGNOSIS — Z87891 Personal history of nicotine dependence: Secondary | ICD-10-CM | POA: Insufficient documentation

## 2021-07-20 ENCOUNTER — Telehealth: Payer: Self-pay | Admitting: Acute Care

## 2021-07-20 ENCOUNTER — Other Ambulatory Visit: Payer: Self-pay | Admitting: *Deleted

## 2021-07-20 ENCOUNTER — Telehealth: Payer: Self-pay | Admitting: *Deleted

## 2021-07-20 DIAGNOSIS — Z87891 Personal history of nicotine dependence: Secondary | ICD-10-CM

## 2021-07-20 DIAGNOSIS — F17210 Nicotine dependence, cigarettes, uncomplicated: Secondary | ICD-10-CM

## 2021-07-20 NOTE — Telephone Encounter (Signed)
Roberto Collins- [facility]  Calling about shared patient- recent scan of lungs- incidental finding reported. She is requesting review by PCP and follow up: Presumed ascites along the liver and spleen. Difficult to exclude peritoneal carcinomatosis. Consider CT abdomen pelvis with contrast in further evaluation of this patient with rectal cancer, as clinically indicated.

## 2021-07-20 NOTE — Progress Notes (Signed)
I have called the patient with the results of his low dose CT Chest. Lung RADS 1, negative study: no nodules or definitely benign nodules. Radiology recommendation is for a repeat LDCT in 12 months.  There was notation of Presumed ascites along the liver and spleen. Difficult to exclude peritoneal carcinomatosis. Consider CT abdomen pelvis with contrast in further evaluation of this patient with rectal cancer, as clinically indicated.  I have called the patient's PCP office , [name], PA, and they are aware of the scan results and will follow up regarding the need for additional imaging of the abdomen and pelvis.

## 2021-07-20 NOTE — Telephone Encounter (Signed)
Called and spoke with Truman Hayward at [facility] about the call report.  Will forward to SG to make her aware.

## 2021-07-20 NOTE — Telephone Encounter (Signed)
Noted by triage.   Langley Gauss, please order CT.

## 2021-07-21 NOTE — Telephone Encounter (Signed)
CT results faxed to PCP. Order placed for 1 yr f/u low dose lung ct.

## 2021-07-27 NOTE — Telephone Encounter (Signed)
Pt needs sooner appt to review CT findings and order needed f/up tests/imaging (appt needs to be separate from his routine f/up in March)

## 2021-07-28 ENCOUNTER — Ambulatory Visit: Payer: Medicare HMO | Admitting: Anesthesiology

## 2021-07-28 ENCOUNTER — Ambulatory Visit
Admission: RE | Admit: 2021-07-28 | Discharge: 2021-07-28 | Disposition: A | Discharge status: Home / Self Care | Payer: Medicare HMO | Attending: Gastroenterology | Admitting: Gastroenterology

## 2021-07-28 ENCOUNTER — Encounter
Admission: RE | Disposition: A | Discharge status: Home / Self Care | Payer: Self-pay | Source: Home / Self Care | Attending: Gastroenterology

## 2021-07-28 DIAGNOSIS — D123 Benign neoplasm of transverse colon: Secondary | ICD-10-CM | POA: Insufficient documentation

## 2021-07-28 DIAGNOSIS — Z79899 Other long term (current) drug therapy: Secondary | ICD-10-CM | POA: Diagnosis not present

## 2021-07-28 DIAGNOSIS — K635 Polyp of colon: Secondary | ICD-10-CM

## 2021-07-28 DIAGNOSIS — Z880 Allergy status to penicillin: Secondary | ICD-10-CM | POA: Diagnosis not present

## 2021-07-28 DIAGNOSIS — D128 Benign neoplasm of rectum: Secondary | ICD-10-CM | POA: Diagnosis not present

## 2021-07-28 DIAGNOSIS — K621 Rectal polyp: Secondary | ICD-10-CM

## 2021-07-28 DIAGNOSIS — F17210 Nicotine dependence, cigarettes, uncomplicated: Secondary | ICD-10-CM | POA: Diagnosis not present

## 2021-07-28 DIAGNOSIS — Z86010 Personal history of colonic polyps: Secondary | ICD-10-CM | POA: Diagnosis not present

## 2021-07-28 DIAGNOSIS — D125 Benign neoplasm of sigmoid colon: Secondary | ICD-10-CM | POA: Insufficient documentation

## 2021-07-28 DIAGNOSIS — Z09 Encounter for follow-up examination after completed treatment for conditions other than malignant neoplasm: Secondary | ICD-10-CM | POA: Diagnosis present

## 2021-07-28 DIAGNOSIS — Z1211 Encounter for screening for malignant neoplasm of colon: Secondary | ICD-10-CM

## 2021-07-28 HISTORY — PX: COLONOSCOPY WITH PROPOFOL: SHX5780

## 2021-07-28 SURGERY — COLONOSCOPY WITH PROPOFOL
Anesthesia: General

## 2021-07-28 MED ORDER — LIDOCAINE HCL (CARDIAC) PF 100 MG/5ML IV SOSY
PREFILLED_SYRINGE | INTRAVENOUS | Status: DC | PRN
  Administered 2021-07-28: 50 mg via INTRAVENOUS

## 2021-07-28 MED ORDER — PROPOFOL 10 MG/ML IV BOLUS
INTRAVENOUS | Status: DC | PRN
  Administered 2021-07-28: 40 mg via INTRAVENOUS

## 2021-07-28 MED ORDER — PROPOFOL 500 MG/50ML IV EMUL
INTRAVENOUS | Status: AC
  Filled 2021-07-28: qty 50

## 2021-07-28 MED ORDER — PROPOFOL 500 MG/50ML IV EMUL
INTRAVENOUS | Status: DC | PRN
  Administered 2021-07-28: 150 ug/kg/min via INTRAVENOUS

## 2021-07-28 MED ORDER — LIDOCAINE HCL (PF) 2 % IJ SOLN
INTRAMUSCULAR | Status: AC
  Filled 2021-07-28: qty 5

## 2021-07-28 MED ORDER — SODIUM CHLORIDE 0.9 % IV SOLN
INTRAVENOUS | Status: DC
  Administered 2021-07-28: 1000 mL via INTRAVENOUS

## 2021-07-28 NOTE — Telephone Encounter (Signed)
We normally do not send letters. I tried calling him again, not successful. I also sent pt a mychart message

## 2021-07-28 NOTE — H&P (Signed)
Roberto Collins, Roberto Collins, Roberto Collins, Roberto Collins, Roberto Collins, Roberto Collins, Roberto Collins, Roberto Collins, Roberto Collins, Roberto Phone: [phone]  Fax: [phone]  Primary Care Physician:  [name], Roberto   Pre-Procedure History & Physical: HPI:  Roberto Collins is a 70 y.o. male is Collins for a colonoscopy.   Past Medical History:  Diagnosis Date   Anemia    Asthma    Back pain    Blood transfusion without reported diagnosis     Past Surgical History:  Procedure Laterality Date   BACK SURGERY  2000   COLONOSCOPY WITH PROPOFOL N/A 04/02/2019   Procedure: COLONOSCOPY WITH PROPOFOL;  Surgeon: Virgel Manifold, Roberto;  Location: [facility] ENDOSCOPY;  Service: Endoscopy;  Laterality: N/A;   COLONOSCOPY WITH PROPOFOL N/A 04/20/2020   Procedure: COLONOSCOPY WITH PROPOFOL;  Surgeon: Virgel Manifold, Roberto;  Location: [facility] ENDOSCOPY;  Service: Endoscopy;  Laterality: N/A;   ESOPHAGOGASTRODUODENOSCOPY Left 03/31/2019   Procedure: ESOPHAGOGASTRODUODENOSCOPY (EGD);  Surgeon: Virgel Manifold, Roberto;  Location: [facility] ENDOSCOPY;  Service: Endoscopy;  Laterality: Left;   GIVENS CAPSULE STUDY N/A 04/02/2019   Procedure: GIVENS CAPSULE STUDY;  Surgeon: Virgel Manifold, Roberto;  Location: [facility] ENDOSCOPY;  Service: Endoscopy;  Laterality: N/A;   XI ROBOT ASSISTED TRANSANAL RESECTION N/A 06/25/2019   Procedure: XI ROBOT ASSISTED PARTIAL PROCTECTOMY OF RECTAL MASS USING TAMIS;  Surgeon: [name], Roberto;  Location: [facility] ORS;  Service: General;  Laterality: N/A;    Prior to Admission medications   Medication Sig Start Date End Date Taking? Authorizing Provider  Cholecalciferol (VITAMIN D) 50 MCG (2000 UT) CAPS Take 2,000 Units by mouth daily.   Yes Provider, Historical, Roberto  morphine (MS CONTIN) 60 MG 12 hr tablet Take 60 mg by mouth every 12 (twelve) hours.   Yes Provider, Historical, Roberto  chlorproMAZINE (THORAZINE) 100 MG tablet Take 300 mg by mouth at bedtime.     Provider, Historical, Roberto   cyclobenzaprine (FLEXERIL) 10 MG tablet Take 10 mg by mouth at bedtime.    Provider, Historical, Roberto  gabapentin (NEURONTIN) 600 MG tablet Take 1,800 mg by mouth at bedtime.     Provider, Historical, Roberto  pramipexole (MIRAPEX) 0.5 MG tablet Take by mouth. 02/19/13   Provider, Historical, Roberto  rosuvastatin (CRESTOR) 5 MG tablet Take 1 tablet (5 mg total) by mouth at bedtime. 02/14/21   [name], Roberto  Sod Picosulfate-Mag Ox-Cit Acd (CLENPIQ) 10-3.5-12 MG-GM -GM/160ML SOLN Take 1 kit by mouth as directed. At 5 PM evening before procedure, drink 1 bottle of Clenpiq, hydrate, drink (5) 8 oz of water. Then do the same thing 5 hours prior to your procedure. 06/07/21   Virgel Manifold, Roberto    Allergies as of 06/08/2021 - Review Complete 06/07/2021  Allergen Reaction Noted   Bee venom Anaphylaxis 04/04/2012   Penicillins Anaphylaxis and Swelling 03/30/2019    Family History  Problem Relation Age of Onset   Liver cancer Father     Social History   Socioeconomic History   Marital status: Single    Spouse name: Not on file   Number of children: 0   Years of education: Not on file   Highest education level: Not on file  Occupational History   Occupation: disabled  Tobacco Use   Smoking status: Every Day    Packs/day: 1.00    Years: 40.00    Pack years: 40.00    Types: Cigarettes    Start date: 08/02/2009   Smokeless tobacco: Never  Tobacco comments:    [place] smoking cessation program info provided  Vaping Use   Vaping Use: Never used  Substance and Sexual Activity   Alcohol use: Not Currently   Drug use: Never   Sexual activity: Not Currently  Other Topics Concern   Not on file  Social History Narrative   Lives alone; used to roofing/ disabled; no children   Social Determinants of Health   Financial Resource Strain: Low Risk    Difficulty of Paying Living Expenses: Not hard at all  Food Insecurity: No Food Insecurity   Worried About Charity fundraiser in the Last  Year: Never true   [place] in the Last Year: Never true  Transportation Needs: No Transportation Needs   Lack of Transportation (Medical): No   Lack of Transportation (Non-Medical): No  Physical Activity: Insufficiently Active   Days of Exercise per Week: 3 days   Minutes of Exercise per Session: 30 min  Stress: No Stress Concern Present   Feeling of Stress : Not at all  Social Connections: Socially Isolated   Frequency of Communication with Friends and Family: More than three times a week   Frequency of Social Gatherings with Friends and Family: Once a week   Attends Religious Services: Never   Marine scientist or Organizations: No   Attends Music therapist: Never   Marital Status: Never married  Human resources officer Violence: Not At Risk   Fear of Current or Ex-Partner: No   Emotionally Abused: No   Physically Abused: No   Sexually Abused: No    Review of Systems: See HPI, otherwise negative ROS  Physical Exam: Constitutional: General:   Alert,  Well-developed, well-nourished, pleasant and cooperative in NAD BP 120/75   Pulse 88   Temp 97.6 F (36.4 C) (Tympanic)   Resp 16   Ht 5' 7" (1.702 m)   Wt 58.9 kg   SpO2 100%   BMI 20.34 kg/m   Head: Normocephalic, atraumatic.   Eyes:  Sclera clear, no icterus.   Conjunctiva pink.   Mouth:  No deformity or lesions, oropharynx pink & moist.  Neck:  Supple, trachea midline  Respiratory: Normal respiratory effort  Gastrointestinal:  Soft, non-tender and non-distended without masses, hepatosplenomegaly or hernias noted.  No guarding or rebound tenderness.     Cardiac: No clubbing or edema.  No cyanosis. Normal posterior tibial pedal pulses noted.  Lymphatic:  No significant cervical adenopathy.  Psych:  Alert and cooperative. Normal mood and affect.  Musculoskeletal:   Symmetrical without gross deformities. 5/5 Lower extremity strength bilaterally.  Skin: Warm. Intact without significant  lesions or rashes. No jaundice.  Neurologic:  Face symmetrical, tongue midline, Normal sensation to touch;  grossly normal neurologically.  Psych:  Alert and oriented x3, Alert and cooperative. Normal mood and affect.  Impression/Plan: Malakye Nolden is Collins for a colonoscopy to be performed for history of polyps. Rectal polyp with high grade dysplasia in 2020. Pt underwent robotic TAMIS with Dr. Johney Maine with pathology showing negative margins for dysplasia. Colonoscopy last year with several tubular adenoma and TVA polyps removed.  Risks, benefits, limitations, and alternatives regarding  colonoscopy have been reviewed with the patient.  Questions have been answered.  All parties agreeable.   Virgel Manifold, Roberto  07/28/2021, 9:27 AM

## 2021-07-28 NOTE — Anesthesia Postprocedure Evaluation (Signed)
Anesthesia Post Note  Patient: Rio Kidane  Procedure(s) Performed: COLONOSCOPY WITH PROPOFOL  Patient location during evaluation: PACU Anesthesia Type: General Level of consciousness: awake and alert, oriented and patient cooperative Pain management: pain level controlled Vital Signs Assessment: post-procedure vital signs reviewed and stable Respiratory status: spontaneous breathing, nonlabored ventilation and respiratory function stable Cardiovascular status: blood pressure returned to baseline and stable Postop Assessment: adequate PO intake Anesthetic complications: no   No notable events documented.   Last Vitals:  Vitals:   07/28/21 1029 07/28/21 1039  BP: 114/75 123/77  Pulse: 67 63  Resp: 12 10  Temp:    SpO2: 100% 99%    Last Pain:  Vitals:   07/28/21 1039  TempSrc:   PainSc: 0-No pain                 Darrin Nipper

## 2021-07-28 NOTE — Anesthesia Preprocedure Evaluation (Signed)
Anesthesia Evaluation  Patient identified by MRN, date of birth, ID band Patient awake    Reviewed: Allergy & Precautions, NPO status , Patient's Chart, lab work & pertinent test results  History of Anesthesia Complications Negative for: history of anesthetic complications  Airway Mallampati: II   Neck ROM: Full    Dental   Missing all but 2 lower teeth:   Pulmonary asthma , Current Smoker (less than 1 ppd)Patient did not abstain from smoking.,    Pulmonary exam normal breath sounds clear to auscultation       Cardiovascular negative cardio ROS Normal cardiovascular exam Rhythm:Regular Rate:Normal     Neuro/Psych negative neurological ROS     GI/Hepatic negative GI ROS,   Endo/Other  negative endocrine ROS  Renal/GU negative Renal ROS     Musculoskeletal   Abdominal   Peds  Hematology  (+) Blood dyscrasia, anemia ,   Anesthesia Other Findings   Reproductive/Obstetrics                             Anesthesia Physical Anesthesia Plan  ASA: 2  Anesthesia Plan: General   Post-op Pain Management:    Induction: Intravenous  PONV Risk Score and Plan: 1 and Propofol infusion, TIVA and Treatment may vary due to age or medical condition  Airway Management Planned: Natural Airway  Additional Equipment:   Intra-op Plan:   Post-operative Plan:   Informed Consent: I have reviewed the patients History and Physical, chart, labs and discussed the procedure including the risks, benefits and alternatives for the proposed anesthesia with the patient or authorized representative who has indicated his/her understanding and acceptance.       Plan Discussed with: CRNA  Anesthesia Plan Comments:         Anesthesia Quick Evaluation

## 2021-07-28 NOTE — Telephone Encounter (Signed)
Tried to call pt to get him scheduled. No answer and VM not setup

## 2021-07-28 NOTE — Telephone Encounter (Signed)
He was having his colonoscopy today so he may not have been available

## 2021-07-28 NOTE — Anesthesia Procedure Notes (Signed)
Procedure Name: MAC Date/Time: 07/28/2021 9:29 AM Performed by: Jerrye Noble, CRNA Pre-anesthesia Checklist: Patient identified, Emergency Drugs available, Suction available and Patient being monitored Patient Re-evaluated:Patient Re-evaluated prior to induction Oxygen Delivery Method: Nasal cannula

## 2021-07-28 NOTE — Op Note (Signed)
[facility] Patient Name: Roberto Collins Procedure Date: 07/28/2021 9:27 AM MRN: [id number] Account #: [id number] Date of Birth: 03-24-1951 Admit Type: Outpatient Age: 70 Room: [facility] ENDO ROOM 2 Gender: Male Note Status: Finalized Instrument Name: Jasper Riling [id number] Procedure:             Colonoscopy Indications:           High risk colon cancer surveillance: Personal history                         of colonic polyps Providers:             Kaedyn Polivka B. Bonna Gains MD, MD Medicines:             Monitored Anesthesia Care Complications:         No immediate complications. Procedure:             Pre-Anesthesia Assessment:                        - ASA Grade Assessment: II - A patient with mild                         systemic disease.                        - Prior to the procedure, a History and Physical was                         performed, and patient medications, allergies and                         sensitivities were reviewed. The patient's tolerance                         of previous anesthesia was reviewed.                        - The risks and benefits of the procedure and the                         sedation options and risks were discussed with the                         patient. All questions were answered and informed                         consent was obtained.                        - Patient identification and proposed procedure were                         verified prior to the procedure by the physician, the                         nurse, the anesthesiologist, the anesthetist and the                         technician. The procedure was verified in the  procedure room.                        After obtaining informed consent, the colonoscope was                         passed under direct vision. Throughout the procedure,                         the patient's blood pressure, pulse, and oxygen                          saturations were monitored continuously. The                         Colonoscope was introduced through the anus and                         advanced to the the cecum, identified by appendiceal                         orifice and ileocecal valve. The colonoscopy was                         performed with ease. The patient tolerated the                         procedure well. The quality of the bowel preparation                         was good. Findings:      The perianal and digital rectal examinations were normal.      Eight sessile polyps were found in the transverse colon. The polyps were       5 to 8 mm in size. These polyps were removed with a cold snare.       Resection and retrieval were complete.      Two sessile polyps were found in the sigmoid colon. The polyps were 5 to       6 mm in size. These polyps were removed with a cold snare. Resection and       retrieval were complete.      A 15 mm, non-bleeding polyp was found in the rectum. The polyp was       multi-lobulated. Biopsies were taken with a cold forceps for histology.       This biopsy of the polyp was obtained from the top of the polyp and not       the base (to avoid causing scarring from biopsies at the base). This is       likely recurrence of polyp at previous surgery/TAMIS site.      The exam was otherwise without abnormality. Impression:            - Eight 5 to 8 mm polyps in the transverse colon,                         removed with a cold snare. Resected and retrieved.                        - Two 5 to 6 mm  polyps in the sigmoid colon, removed                         with a cold snare. Resected and retrieved.                        - One 15 mm, non-bleeding polyp in the rectum.                         Biopsied.                        - The examination was otherwise normal. Recommendation:        - Discharge patient to home (with escort).                        - I have messaged Dr. Johney Maine via Epic (previous  surgeon                         who performed his TAMIS), to arrange follow up with                         them for rectal polyp.                        - Return to my office in 4 weeks.                        - Advance diet as tolerated.                        - Continue present medications.                        - Await pathology results.                        - Repeat colonoscopy date to be determined after                         pending pathology results are reviewed.                        - The findings and recommendations were discussed with                         the patient.                        - The findings and recommendations were discussed with                         the patient's family.                        - Return to primary care physician as previously                         scheduled. Procedure Code(s):     --- Professional ---  45385, Colonoscopy, flexible; with removal of                         tumor(s), polyp(s), or other lesion(s) by snare                         technique                        45380, 59, Colonoscopy, flexible; with biopsy, single                         or multiple Diagnosis Code(s):     --- Professional ---                        Z86.010, Personal history of colonic polyps                        K63.5, Polyp of colon                        K62.1, Rectal polyp CPT copyright 2019 American Medical Association. All rights reserved. The codes documented in this report are preliminary and upon coder review may  be revised to meet current compliance requirements.  [name], MD Margretta Sidle B. Bonna Gains MD, MD 07/28/2021 10:17:39 AM This report has been signed electronically. Number of Addenda: 0 Note Initiated On: 07/28/2021 9:27 AM Scope Withdrawal Time: 0 hours 25 minutes 38 seconds  Total Procedure Duration: 0 hours 32 minutes 1 second  Estimated Blood Loss:  Estimated blood loss: none.      [facility]

## 2021-07-28 NOTE — Transfer of Care (Signed)
Immediate Anesthesia Transfer of Care Note  Patient: Roberto Collins  Procedure(s) Performed: COLONOSCOPY WITH PROPOFOL  Patient Location: PACU and Endoscopy Unit  Anesthesia Type:General  Level of Consciousness: drowsy and patient cooperative  Airway & Oxygen Therapy: Patient Spontanous Breathing  Post-op Assessment: Report given to RN and Post -op Vital signs reviewed and stable  Post vital signs: Reviewed and stable  Last Vitals:  Vitals Value Taken Time  BP 88/59 07/28/21 1009  Temp    Pulse 68 07/28/21 1009  Resp 11 07/28/21 1009  SpO2 100 % 07/28/21 1009  Vitals shown include unvalidated device data.  Last Pain:  Vitals:   07/28/21 0859  TempSrc: Tympanic  PainSc: 0-No pain         Complications: No notable events documented.

## 2021-07-28 NOTE — Telephone Encounter (Signed)
He apparently called back and scheduled for Monday with you

## 2021-07-31 ENCOUNTER — Other Ambulatory Visit: Payer: Self-pay

## 2021-07-31 ENCOUNTER — Ambulatory Visit (INDEPENDENT_AMBULATORY_CARE_PROVIDER_SITE_OTHER): Payer: Medicare HMO | Admitting: Family Medicine

## 2021-07-31 ENCOUNTER — Encounter: Payer: Self-pay | Admitting: Family Medicine

## 2021-07-31 VITALS — BP 116/68 | HR 91 | Temp 97.6°F | Resp 18 | Ht 70.0 in | Wt 125.1 lb

## 2021-07-31 DIAGNOSIS — C20 Malignant neoplasm of rectum: Secondary | ICD-10-CM | POA: Diagnosis not present

## 2021-07-31 DIAGNOSIS — Z7689 Persons encountering health services in other specified circumstances: Secondary | ICD-10-CM | POA: Diagnosis not present

## 2021-07-31 DIAGNOSIS — Z8619 Personal history of other infectious and parasitic diseases: Secondary | ICD-10-CM

## 2021-07-31 LAB — SURGICAL PATHOLOGY

## 2021-07-31 NOTE — Progress Notes (Signed)
Patient ID: Roberto Collins, male    DOB: [birth year], 70 y.o.   MRN: [id number]  PCP: [name], PA-C  Chief Complaint  Patient presents with   Results    Pt is here to go over his CT results done recently.    Subjective:   Roberto Collins is a 70 y.o. male, presents to clinic with CC of the following:  HPI  Pt here to review incidental findings from low dose CT lung CA screening -  Reviewed CT report with pt CT - Upper Abdomen: Thin rind of fluid adjacent to the liver and spleen. Portions of the liver, adrenal glands, kidneys, spleen, pancreas, stomach and bowel are grossly unremarkable. IMPRESSION: 1. Lung-RADS 1, negative. Continue annual screening with low-dose chest CT without contrast in 12 months. 2. Presumed ascites along the liver and spleen. Difficult to exclude peritoneal carcinomatosis. Consider CT abdomen pelvis with contrast in further evaluation of this patient with rectal cancer, as clinically indicated. These results will be called to the ordering clinician or representative by the Radiologist Assistant, and communication documented in the PACS or [employer]. 3. Aortic atherosclerosis (ICD10-I70.0). Coronary artery calcification. 4.  Emphysema (ICD10-J43.9).  Pt has hx of hepatitis C, heavy smoker, hx of rectal cancer - though he denies this Friday (3 d ago) he had his colonoscopy done)  HE denies any abd pain, bloating, swelling, jaundice, rash, skin or weight changes, bowel changes CP  Weight down ~5 lbs Wt Readings from Last 10 Encounters:  07/31/21 125 lb 1.6 oz (56.7 kg)  07/28/21 129 lb 13.6 oz (58.9 kg)  07/19/21 130 lb (59 kg)  05/11/21 125 lb (56.7 kg)  02/21/21 125 lb 12.8 oz (57.1 kg)  02/08/21 127 lb (57.6 kg)  04/20/20 136 lb (61.7 kg)  04/05/20 129 lb 6.4 oz (58.7 kg)  02/02/20 134 lb 1.6 oz (60.8 kg)  08/03/19 132 lb (59.9 kg)   BMI Readings from Last 5 Encounters:  07/31/21 17.95 kg/m  07/28/21 20.34 kg/m  07/19/21 20.36  kg/m  05/11/21 17.94 kg/m  02/21/21 18.05 kg/m  BMI down     Patient Active Problem List   Diagnosis Date Noted   Candidate for statin therapy due to risk of future cardiovascular event 05/11/2021   Coronary artery disease involving native heart without angina pectoris 05/11/2021   Atherosclerosis 05/11/2021   Pulmonary emphysema ([place]) 05/11/2021   Current every day smoker 05/11/2021   Smoking greater than 30 pack years 05/11/2021   History of colonic polyps    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 , Disp: , Rfl:    Cholecalciferol (VITAMIN D) 50 MCG (2000 UT) CAPS, Take 2,000 Units by mouth daily., Disp: , Rfl:    cyclobenzaprine (FLEXERIL) 10 MG tablet, Take 10 mg by mouth at bedtime., Disp: , Rfl:    gabapentin (NEURONTIN) 600 MG tablet, Take 1,800 mg by mouth at bedtime. , Disp: , Rfl:    morphine (MS CONTIN) 60 MG 12 hr tablet, Take 60 mg by mouth every  12 (twelve) hours., Disp: , Rfl:    pramipexole (MIRAPEX) 0.5 MG tablet, Take by mouth., Disp: , Rfl:    rOPINIRole (REQUIP) 1 MG tablet, Take 1 mg by mouth at bedtime as needed., Disp: , Rfl:    rosuvastatin (CRESTOR) 5 MG tablet, Take 1 tablet (5 mg total) by mouth at bedtime., Disp: 90 tablet, Rfl: 3   Sod Picosulfate-Mag Ox-Cit Acd (CLENPIQ) 10-3.5-12 MG-GM -GM/160ML SOLN, Take 1 kit by mouth as directed. At 5 PM evening before procedure, drink 1 bottle of Clenpiq, hydrate, drink (5) 8 oz of water. Then do the same thing 5 hours prior to your procedure., Disp: 320 mL,  Rfl: 0   Allergies  Allergen Reactions   Bee Venom Anaphylaxis   Penicillins Anaphylaxis and Swelling    Did it involve swelling of the face/tongue/throat, SOB, or low BP? Yes Did it involve sudden or severe rash/hives, skin peeling, or any reaction on the inside of your mouth or nose? No Did you need to seek medical attention at a hospital or doctor's office? Yes When did it last happen?  Many years ago If all above answers are "NO", may proceed with cephalosporin use.      Social History   Tobacco Use   Smoking status: Every Day    Packs/day: 1.00    Years: 40.00    Pack years: 40.00    Types: Cigarettes    Start date: 08/02/2009   Smokeless tobacco: Never   Tobacco comments:    [place] smoking cessation program info provided  Vaping Use   Vaping Use: Never used  Substance Use Topics   Alcohol use: Not Currently   Drug use: Never      Chart Review Today: I personally reviewed active problem list, medication list, allergies, family history, social history, health maintenance, notes from last encounter, lab results, imaging with the patient/caregiver today.   Review of Systems  Constitutional: Negative.   HENT: Negative.    Eyes: Negative.   Respiratory: Negative.    Cardiovascular: Negative.   Gastrointestinal: Negative.   Endocrine: Negative.   Genitourinary: Negative.   Musculoskeletal: Negative.   Skin: Negative.   Allergic/Immunologic: Negative.   Neurological: Negative.   Hematological: Negative.   Psychiatric/Behavioral: Negative.    All other systems reviewed and are negative.     Objective:   Vitals:   07/31/21 1139  BP: 116/68  Pulse: 91  Resp: 18  Temp: 97.6 F (36.4 C)  TempSrc: Oral  SpO2: 99%  Weight: 125 lb 1.6 oz (56.7 kg)  Height: 5' 10" (1.778 m)    Body mass index is 17.95 kg/m.  Physical Exam Vitals and nursing note reviewed.  Constitutional:      General: He is not in acute distress.    Appearance: Normal  appearance. He is well-developed and well-groomed. He is not ill-appearing, toxic-appearing or diaphoretic.     Interventions: Face mask in place.     Comments: He appears thin  HENT:     Head: Normocephalic and atraumatic.     Jaw: No trismus.     Right Ear: External ear normal.     Left Ear: External ear normal.  Eyes:     General: Lids are normal. No scleral icterus.       Right eye: No discharge.        Left eye: No discharge.     Conjunctiva/sclera: Conjunctivae normal.  Neck:     Trachea: Trachea and phonation normal. No tracheal deviation.    Cardiovascular:     Rate and Rhythm: Normal rate and regular rhythm.     Pulses: Normal pulses.          Radial pulses are 2+ on the right side and 2+ on the left side.     Heart sounds: Heart sounds are distant. No murmur heard.   No friction rub. No gallop.  Pulmonary:     Effort: Pulmonary effort is normal. No tachypnea, accessory muscle usage, respiratory distress or retractions.     Breath sounds: Normal breath sounds. Decreased air movement present. No stridor or transmitted upper airway sounds. No wheezing, rhonchi or rales.     Comments: BS hard to hear but present Increased AP diameter Abdominal:     General: Bowel sounds are normal. There is no distension.     Palpations: Abdomen is soft. There is no shifting dullness, hepatomegaly, mass or pulsatile mass.     Tenderness: There is no abdominal tenderness. There is no right CVA tenderness, left CVA tenderness, guarding or rebound.  Musculoskeletal:     Right lower leg: No edema.     Left lower leg: No edema.  Skin:    General: Skin is warm and dry.     Coloration: Skin is not jaundiced.     Findings: No rash.     Nails: There is no clubbing.  Neurological:     Mental Status: He is alert. Mental status is at baseline.     Cranial Nerves: No dysarthria or facial asymmetry.     Motor: No tremor or abnormal muscle tone.     Gait: Gait normal.  Psychiatric:        Mood and  Affect: Mood normal.        Speech: Speech normal.        Behavior: Behavior normal. Behavior is cooperative.     Results for orders placed or performed during the hospital encounter of 07/28/21  Surgical pathology  Result Value Ref Range   SURGICAL PATHOLOGY      SURGICAL PATHOLOGY CASE: [id number] PATIENT: [employer] Surgical Pathology Report     Specimen Submitted: A. Colon polyp x8, transverse; cold snare B. Colon polyp x2, sigmoid; cold snare C. Rectum polyp; cbx  Clinical History: Colon cancer screening Z12.11.  History of colonic polyps Z86.010.  Colon polyps      DIAGNOSIS: A. COLON POLYPS X8, TRANSVERSE; COLD SNARE: - MULTIPLE FRAGMENTS OF TUBULAR ADENOMAS. - NEGATIVE FOR HIGH-GRADE DYSPLASIA OR MALIGNANCY.  B. COLON POLYPS X2, SIGMOID; COLD SNARE: - MULTIPLE FRAGMENTS OF TUBULAR ADENOMAS. - NEGATIVE FOR HIGH-GRADE DYSPLASIA AND MALIGNANCY.  C. RECTAL POLYP; COLD BIOPSY: - SUPERFICIAL STRIPS OF TUBULOVILLOUS ADENOMA. - DEFINITE HIGH-GRADE DYSPLASIA IS NOT IDENTIFIED. - NEGATIVE FOR MALIGNANCY IN A LIMITED SAMPLING.  Comment: The patient's prior history of villous adenoma with high-grade dysplasia involving the rectum is noted.  Biopsy sections from the rectal polyp display superficial strips of  villous tissue with adenomatous changes. Definite high-grade dysplasia and malignancy are not identified in the current specimen.  However, evaluation is limited by the superficial nature of the sampling.  Clinical and endoscopic correlation is recommended.  GROSS DESCRIPTION: A. Labeled: Cold snare polyp transverse colon x8 Received: Formalin Collection time: 9:35 AM on 07/28/2021 Placed into formalin time: 9:35 AM on 07/28/2021 Tissue fragment(s): Multiple Size: Aggregate, 2.1 x 0.6 x 0.4 cm Description: Received are fragments of tan-pink soft tissue.  The largest soft tissue fragment has a resection margin which is inked black.  This fragment  is  trisected. Entirely submitted in cassettes 1-2 with the trisected fragment in cassette 1 and the remaining fragments in cassette 2.  B. Labeled: Cold snare polyp sigmoid colon x2 Received: Formalin Collection time: 9:59 AM on 07/28/2021 Placed into formalin time: 9:59 AM on 07/28/2021 Tissue fragment(s): Multiple Size : Aggregate, 1.2 x 0.5 x 0.4 cm Description: Received are fragments of tan-pink soft tissue admixed with intestinal debris.  The ratio of soft tissue to intestinal debris is 90: 10.  The 2 largest soft tissue fragments have resection margins which are differentially inked.  1 fragment is trisected and 1 fragment is serially sectioned. Entirely submitted in cassettes 1-2 with the serially sectioned fragment in cassette 1 and the bisected fragment with the remaining fragments in cassette 2.  C. Labeled: cbx rectal polyp Received: Formalin Collection time: 10:03 AM on 07/28/2021 Placed into formalin time: 10:03 AM on 07/28/2021 Tissue fragment(s): Multiple Size: Aggregate, 0.7 x 0.5 x 0.2 cm Description: Tan-white soft tissue fragments Entirely submitted in 1 cassette.  RB 07/28/2021   Final Diagnosis performed by [name], MD.   Electronically signed 07/31/2021 10:37:31AM The electronic signature indicates that the named Attending Pathologist has evaluated the  specimen Technical component performed at [facility], [address] [place], [place] [address] Lab: [phone] Dir: [name], MD, MMM  Professional component performed at [facility], [facility], [place], [place], [place] [address] Lab: [phone] Dir: Kathi Simpers, MD        Assessment & Plan:   Pt asked to come in to review CT findings and prior to ordering further imaging  He denies any weight loss, abd pain/swelling/bloating  Hx of rectal ca s/p proctectomy, colonoscopy done Friday, I reviewed pathology today but pt has not gotten results from specialists Will  likely proceed with ct abd/pelvis w/ contrast unless GI would like different imaging or plan      ICD-10-CM   1. Encounter for assessment of ascites  Z76.89 CT Abdomen Pelvis W Contrast    2. 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[facility])  C20 CT Abdomen Pelvis W Contrast    3. History of hepatitis C  Z86.19 CT Abdomen Pelvis W Contrast          [name], PA-C 07/31/21 12:21 PM

## 2021-08-02 ENCOUNTER — Other Ambulatory Visit: Payer: Self-pay | Admitting: Gastroenterology

## 2021-08-02 DIAGNOSIS — K621 Rectal polyp: Secondary | ICD-10-CM

## 2021-08-04 ENCOUNTER — Telehealth: Payer: Self-pay

## 2021-08-04 NOTE — Telephone Encounter (Signed)
Hello,  This pt has been scheduled to see Dr Johney Maine on 08/15/21 @ 10:45 in our [place] location. Pt has been notified.  Thanks [employer]

## 2021-08-15 ENCOUNTER — Other Ambulatory Visit (HOSPITAL_COMMUNITY): Payer: Self-pay | Admitting: Surgery

## 2021-08-15 ENCOUNTER — Other Ambulatory Visit: Payer: Self-pay | Admitting: Surgery

## 2021-08-15 DIAGNOSIS — Z8719 Personal history of other diseases of the digestive system: Secondary | ICD-10-CM

## 2021-08-15 DIAGNOSIS — D128 Benign neoplasm of rectum: Secondary | ICD-10-CM

## 2021-08-17 ENCOUNTER — Encounter: Payer: Self-pay | Admitting: Internal Medicine

## 2021-08-17 ENCOUNTER — Ambulatory Visit (INDEPENDENT_AMBULATORY_CARE_PROVIDER_SITE_OTHER): Payer: Medicare HMO | Admitting: Cardiology

## 2021-08-17 ENCOUNTER — Encounter: Payer: Self-pay | Admitting: Cardiology

## 2021-08-17 ENCOUNTER — Ambulatory Visit
Admission: RE | Admit: 2021-08-17 | Discharge: 2021-08-17 | Disposition: A | Discharge status: Home / Self Care | Payer: Medicare HMO | Source: Ambulatory Visit | Attending: Surgery | Admitting: Surgery

## 2021-08-17 ENCOUNTER — Other Ambulatory Visit: Payer: Self-pay

## 2021-08-17 VITALS — BP 110/72 | HR 89 | Ht 70.5 in | Wt 124.0 lb

## 2021-08-17 DIAGNOSIS — I2510 Atherosclerotic heart disease of native coronary artery without angina pectoris: Secondary | ICD-10-CM | POA: Diagnosis not present

## 2021-08-17 DIAGNOSIS — Z01818 Encounter for other preprocedural examination: Secondary | ICD-10-CM

## 2021-08-17 DIAGNOSIS — E7800 Pure hypercholesterolemia, unspecified: Secondary | ICD-10-CM

## 2021-08-17 DIAGNOSIS — Z01810 Encounter for preprocedural cardiovascular examination: Secondary | ICD-10-CM | POA: Diagnosis not present

## 2021-08-17 DIAGNOSIS — Z8719 Personal history of other diseases of the digestive system: Secondary | ICD-10-CM

## 2021-08-17 DIAGNOSIS — D128 Benign neoplasm of rectum: Secondary | ICD-10-CM

## 2021-08-17 NOTE — Progress Notes (Signed)
Cardiology Office Note:    Date:  08/17/2021   ID:  Roberto Collins, DOB [birth year], MRN [id number]  PCP:  [name], PA-C   [facility] Providers Cardiologist:  Kate Sable, MD     Referring MD: [name], PA-C   Chief Complaint  Patient presents with   New Patient (Initial Visit)    Referred by Dr. Johney Maine for Cardiac clearance. Meds reviewed verbally with patient.    Roberto Collins is a 70 y.o. male who is being seen today for the evaluation of preop eval at the request of [name], PA-C.   History of Present Illness:    Roberto Collins is a 70 y.o. male with a hx of CAD, emphysema, current smoker x40+ years who presents for preop evaluation.  Patient denies any history of anemia, work-up did reveal a colonic mass.  Colon resection is being planned.  He is a current smoker, denies chest pain or shortness of breath.  Recent chest CT for lung cancer screening did not reveal coronary artery calcifications.  Does not have a primary care provider.  States feeling well, has no other concerns at this time.  Past Medical History:  Diagnosis Date   Anemia    Asthma    Back pain    Blood transfusion without reported diagnosis     Past Surgical History:  Procedure Laterality Date   BACK SURGERY  2000   COLONOSCOPY WITH PROPOFOL N/A 04/02/2019   Procedure: COLONOSCOPY WITH PROPOFOL;  Surgeon: Virgel Manifold, MD;  Location: [facility] ENDOSCOPY;  Service: Endoscopy;  Laterality: N/A;   COLONOSCOPY WITH PROPOFOL N/A 04/20/2020   Procedure: COLONOSCOPY WITH PROPOFOL;  Surgeon: Virgel Manifold, MD;  Location: [facility] ENDOSCOPY;  Service: Endoscopy;  Laterality: N/A;   COLONOSCOPY WITH PROPOFOL N/A 07/28/2021   Procedure: COLONOSCOPY WITH PROPOFOL;  Surgeon: Virgel Manifold, MD;  Location: [facility] ENDOSCOPY;  Service: Endoscopy;  Laterality: N/A;   ESOPHAGOGASTRODUODENOSCOPY Left 03/31/2019   Procedure: ESOPHAGOGASTRODUODENOSCOPY (EGD);  Surgeon: Virgel Manifold, MD;   Location: [facility] ENDOSCOPY;  Service: Endoscopy;  Laterality: Left;   GIVENS CAPSULE STUDY N/A 04/02/2019   Procedure: GIVENS CAPSULE STUDY;  Surgeon: Virgel Manifold, MD;  Location: [facility] ENDOSCOPY;  Service: Endoscopy;  Laterality: N/A;   XI ROBOT ASSISTED TRANSANAL RESECTION N/A 06/25/2019   Procedure: XI ROBOT ASSISTED PARTIAL PROCTECTOMY OF RECTAL MASS USING TAMIS;  Surgeon: [name], MD;  Location: [facility];  Service: General;  Laterality: N/A;    Current Medications: Current Meds  Medication Sig   albuterol (VENTOLIN HFA) 108 (90 Base) MCG/ACT inhaler Inhale 2 puffs into the lungs every 6 (six) hours as needed for wheezing or shortness of breath.   Cholecalciferol (VITAMIN D) 50 MCG (2000 UT) CAPS Take 2,000 Units by mouth daily.   cyclobenzaprine (FLEXERIL) 10 MG tablet Take 10 mg by mouth at bedtime.   fluticasone-salmeterol (ADVAIR) 250-50 MCG/ACT AEPB Inhale 1 puff into the lungs in the morning and at bedtime.   gabapentin (NEURONTIN) 600 MG tablet Take 1,800 mg by mouth at bedtime.    morphine (MS CONTIN) 60 MG 12 hr tablet Take 60 mg by mouth every 12 (twelve) hours.   oxyCODONE-acetaminophen (PERCOCET) 5-325 MG tablet Take 1 tablet by mouth every 4 (four) hours as needed for severe pain.   rosuvastatin (CRESTOR) 5 MG tablet Take 1 tablet (5 mg total) by mouth at bedtime.     Allergies:   Bee venom and Penicillins   Social History   Socioeconomic History  Marital status: Single    Spouse name: Not on file   Number of children: 0   Years of education: Not on file   Highest education level: Not on file  Occupational History   Occupation: disabled  Tobacco Use   Smoking status: Every Day    Packs/day: 1.00    Years: 40.00    Pack years: 40.00    Types: Cigarettes    Start date: 08/02/2009   Smokeless tobacco: Never   Tobacco comments:    [place] smoking cessation program info provided  Vaping Use   Vaping Use: Never used  Substance and Sexual Activity    Alcohol use: Not Currently   Drug use: Never   Sexual activity: Not Currently  Other Topics Concern   Not on file  Social History Narrative   Lives alone; used to roofing/ disabled; no children   Social Determinants of Radio broadcast assistant Strain: Low Risk    Difficulty of Paying Living Expenses: Not hard at all  Food Insecurity: No Food Insecurity   Worried About Charity fundraiser in the Last Year: Never true   [place] in the Last Year: Never true  Transportation Needs: No Transportation Needs   Lack of Transportation (Medical): No   Lack of Transportation (Non-Medical): No  Physical Activity: Insufficiently Active   Days of Exercise per Week: 3 days   Minutes of Exercise per Session: 30 min  Stress: No Stress Concern Present   Feeling of Stress : Not at all  Social Connections: Socially Isolated   Frequency of Communication with Friends and Family: More than three times a week   Frequency of Social Gatherings with Friends and Family: Once a week   Attends Religious Services: Never   Marine scientist or Organizations: No   Attends Archivist Meetings: Never   Marital Status: Never married     Family History: The patient's family history includes Liver cancer in his father.  ROS:   Please see the history of present illness.     All other systems reviewed and are negative.  EKGs/Labs/Other Studies Reviewed:    The following studies were reviewed today:   EKG:  EKG is  ordered today.  The ekg ordered today demonstrates normal sinus rhythm  Recent Labs: 02/08/2021: Hemoglobin 15.3; Platelets 245 05/11/2021: ALT 20; BUN 12; Creat 0.90; Potassium 4.6; Sodium 134  Recent Lipid Panel    Component Value Date/Time   CHOL 109 05/11/2021 1615   TRIG 99 05/11/2021 1615   HDL 41 05/11/2021 1615   CHOLHDL 2.7 05/11/2021 1615   LDLCALC 50 05/11/2021 1615     Risk Assessment/Calculations:          Physical Exam:    VS:  BP 110/72 (BP  Location: Left Arm, Patient Position: Sitting, Cuff Size: Normal)   Pulse 89   Ht 5' 10.5" (1.791 m)   Wt 124 lb (56.2 kg)   BMI 17.54 kg/m     Wt Readings from Last 3 Encounters:  08/17/21 124 lb (56.2 kg)  07/31/21 125 lb 1.6 oz (56.7 kg)  07/28/21 129 lb 13.6 oz (58.9 kg)     GEN:  Well nourished, well developed in no acute distress HEENT: Normal NECK: No JVD; No carotid bruits LYMPHATICS: No lymphadenopathy CARDIAC: RRR, no murmurs, rubs, gallops RESPIRATORY: Expiratory wheezing, rhonchi ABDOMEN: Soft, non-tender, non-distended MUSCULOSKELETAL:  No edema; No deformity  SKIN: Warm and dry NEUROLOGIC:  Alert and oriented  x 3 PSYCHIATRIC:  Normal affect   ASSESSMENT:    1. Pre-op evaluation   2. Coronary artery disease involving native coronary artery of native heart, unspecified whether angina present   3. Pure hypercholesterolemia    PLAN:    In order of problems listed above:  Preop evaluation for colonic resection.  Abdominal surgery are considered high risk from a cardiac perspective.  Coronary artery calcifications noted on chest CT.  We will plan additional testing with echocardiogram and Lexiscan Myoview.  Further recommendations based on testing results. CAD, coronary artery calcifications on chest CT.  Start aspirin 81 mg, this can be held prior to procedure if patient deemed a surgical candidate.  Continue Crestor.  Cholesterol well controlled, LDL at goal. Hyperlipidemia, cholesterol controlled, continue Crestor.  Follow-up after echo and [place]    Shared Decision Making/Informed Consent The risks [chest pain, shortness of breath, cardiac arrhythmias, dizziness, blood pressure fluctuations, myocardial infarction, stroke/transient ischemic attack, nausea, vomiting, allergic reaction, radiation exposure, metallic taste sensation and life-threatening complications (estimated to be 1 in 10,000)], benefits (risk stratification, diagnosing coronary artery  disease, treatment guidance) and alternatives of a nuclear stress test were discussed in detail with Mr. Mau and he agrees to proceed.    Medication Adjustments/Labs and Tests Ordered: Current medicines are reviewed at length with the patient today.  Concerns regarding medicines are outlined above.  Orders Placed This Encounter  Procedures   NM Myocar Multi W/Spect W/Wall Motion / EF   EKG 12-Lead   ECHOCARDIOGRAM COMPLETE    No orders of the defined types were placed in this encounter.   Patient Instructions  Medication Instructions:   Your physician has recommended you make the following change in your medication:   START taking Aspirin 81 MG once a day.  *If you need a refill on your cardiac medications before your next appointment, please call your pharmacy*   Lab Work:  None ordered    Testing/Procedures:   Your physician has requested that you have an echocardiogram. Echocardiography is a painless test that uses sound waves to create images of your heart. It provides your doctor with information about the size and shape of your heart and how well your heart's chambers and valves are working. This procedure takes approximately one hour. There are no restrictions for this procedure.  2.    [place]       Your caregiver has ordered a Stress Test with nuclear imaging. The purpose of this test is to evaluate the blood supply to your heart muscle. This procedure is referred to as a "Non-Invasive Stress Test." This is because other than having an IV started in your vein, nothing is inserted or "invades" your body. Cardiac stress tests are done to find areas of poor blood flow to the heart by determining the extent of coronary artery disease (CAD). Some patients exercise on a treadmill, which naturally increases the blood flow to your heart, while others who are  unable to walk on a treadmill due to physical limitations have a pharmacologic/chemical stress agent called  Lexiscan . This medicine will mimic walking on a treadmill by temporarily increasing your coronary blood flow.      PLEASE REPORT TO [facility] MEDICAL MALL ENTRANCE   THE VOLUNTEERS AT THE FIRST DESK WILL DIRECT YOU WHERE TO GO     *Please note: these test may take anywhere between 2-4 hours to complete       Date of Procedure:_____________________________________   Arrival Time for  Procedure:______________________________    PLEASE NOTIFY THE OFFICE AT LEAST 45 HOURS IN ADVANCE IF YOU ARE UNABLE TO KEEP YOUR APPOINTMENT.  [place] 24 HOURS IN ADVANCE IF YOU ARE UNABLE TO KEEP YOUR APPOINTMENT. [phone]         How to prepare for your Myoview test:    1. Do not eat or drink after midnight  2. No caffeine for 24 hours prior to test  3. No smoking 24 hours prior to test.  4. Unless instructed otherwise, Take your medication with a small sips of water.    5.         Ladies, please do not wear dresses. Skirts or pants are appropriate. Please wear a short sleeve shirt.  6. No perfume, cologne or lotion.  7. Wear comfortable walking shoes. No heels!     Follow-Up: At [facility], you and your health needs are our priority.  As part of our continuing mission to provide you with exceptional heart care, we have created designated Provider Care Teams.  These Care Teams include your primary Cardiologist (physician) and Advanced Practice Providers (APPs -  Physician Assistants and Nurse Practitioners) who all work together to provide you with the care you need, when you need it.  We recommend signing up for the patient portal called "MyChart".  Sign up information is provided on this After Visit Summary.  MyChart is used to connect with patients for Virtual Visits (Telemedicine).  Patients are able to view lab/test results, encounter notes, upcoming appointments, etc.  Non-urgent messages can be sent to your provider as well.   To  learn more about what you can do with MyChart, go to [email or web address].    Your next appointment:   Follow up after testing   The format for your next appointment:   In Person  Provider:   You may see Kate Sable, MD or one of the following Advanced Practice Providers on your designated Care Team:   Murray Hodgkins, NP Christell Faith, PA-C  If MD is not listed, click here to update    :1}    Other Instructions    Signed, Kate Sable, MD  08/17/2021 11:41 AM    [place]

## 2021-08-17 NOTE — Patient Instructions (Signed)
Medication Instructions:   Your physician has recommended you make the following change in your medication:   START taking Aspirin 81 MG once a day.  *If you need a refill on your cardiac medications before your next appointment, please call your pharmacy*   Lab Work:  None ordered    Testing/Procedures:   Your physician has requested that you have an echocardiogram. Echocardiography is a painless test that uses sound waves to create images of your heart. It provides your doctor with information about the size and shape of your heart and how well your heart's chambers and valves are working. This procedure takes approximately one hour. There are no restrictions for this procedure.  2.    [place]       Your caregiver has ordered a Stress Test with nuclear imaging. The purpose of this test is to evaluate the blood supply to your heart muscle. This procedure is referred to as a "Non-Invasive Stress Test." This is because other than having an IV started in your vein, nothing is inserted or "invades" your body. Cardiac stress tests are done to find areas of poor blood flow to the heart by determining the extent of coronary artery disease (CAD). Some patients exercise on a treadmill, which naturally increases the blood flow to your heart, while others who are  unable to walk on a treadmill due to physical limitations have a pharmacologic/chemical stress agent called Lexiscan . This medicine will mimic walking on a treadmill by temporarily increasing your coronary blood flow.      PLEASE REPORT TO [facility]TR[facility]   THE VOLUNTEERS AT THE FIRST DESK WILL DIRECT YOU WHERE TO GO     *Please note: these test may take anywhere between 2-4 hours to complete       Date of Procedure:_____________________________________   Arrival Time for Procedure:______________________________    PLEASE NOTIFY THE OFFICE AT LEAST 24 HOURS IN ADVANCE IF YOU ARE UNABLE TO KEEP YOUR APPOINTMENT.   [place] 24 HOURS IN ADVANCE IF YOU ARE UNABLE TO KEEP YOUR APPOINTMENT. [phone]         How to prepare for your Myoview test:    1. Do not eat or drink after midnight  2. No caffeine for 24 hours prior to test  3. No smoking 24 hours prior to test.  4. Unless instructed otherwise, Take your medication with a small sips of water.    5.         Ladies, please do not wear dresses. Skirts or pants are appropriate. Please wear a short sleeve shirt.  6. No perfume, cologne or lotion.  7. Wear comfortable walking shoes. No heels!     Follow-Up: At [facility], you and your health needs are our priority.  As part of our continuing mission to provide you with exceptional heart care, we have created designated Provider Care Teams.  These Care Teams include your primary Cardiologist (physician) and Advanced Practice Providers (APPs -  Physician Assistants and Nurse Practitioners) who all work together to provide you with the care you need, when you need it.  We recommend signing up for the patient portal called "MyChart".  Sign up information is provided on this After Visit Summary.  MyChart is used to connect with patients for Virtual Visits (Telemedicine).  Patients are able to view lab/test results, encounter notes, upcoming appointments, etc.  Non-urgent messages can be sent to your provider as well.  To learn more about what you can do with MyChart, go to [email or web address].    Your next appointment:   Follow up after testing   The format for your next appointment:   In Person  Provider:   You may see Kate Sable, MD or one of the following Advanced Practice Providers on your designated Care Team:   Murray Hodgkins, NP Christell Faith, PA-C  If MD is not listed, click here to update    :1}    Other Instructions

## 2021-08-21 NOTE — Progress Notes (Signed)
Your note says he doesn't have a PCP - I've seen him a few times - so he's established with [place] at [facility] and I just saw him.    Hope he knows we are his primary care  Thanks!  [name], PA-C

## 2021-08-31 ENCOUNTER — Other Ambulatory Visit: Payer: Self-pay

## 2021-08-31 ENCOUNTER — Ambulatory Visit
Admission: RE | Admit: 2021-08-31 | Discharge: 2021-08-31 | Disposition: A | Discharge status: Home / Self Care | Payer: Medicare HMO | Source: Ambulatory Visit | Attending: Surgery | Admitting: Surgery

## 2021-08-31 DIAGNOSIS — Z8719 Personal history of other diseases of the digestive system: Secondary | ICD-10-CM | POA: Insufficient documentation

## 2021-08-31 DIAGNOSIS — D128 Benign neoplasm of rectum: Secondary | ICD-10-CM | POA: Insufficient documentation

## 2021-09-19 ENCOUNTER — Other Ambulatory Visit: Payer: Self-pay

## 2021-09-19 ENCOUNTER — Ambulatory Visit
Admission: RE | Admit: 2021-09-19 | Discharge: 2021-09-19 | Disposition: A | Discharge status: Home / Self Care | Payer: Medicare HMO | Source: Ambulatory Visit | Attending: Family Medicine | Admitting: Family Medicine

## 2021-09-19 DIAGNOSIS — C20 Malignant neoplasm of rectum: Secondary | ICD-10-CM | POA: Diagnosis present

## 2021-09-19 DIAGNOSIS — Z7689 Persons encountering health services in other specified circumstances: Secondary | ICD-10-CM | POA: Diagnosis present

## 2021-09-19 DIAGNOSIS — Z8619 Personal history of other infectious and parasitic diseases: Secondary | ICD-10-CM | POA: Insufficient documentation

## 2021-09-19 LAB — POCT I-STAT CREATININE: Creatinine, Ser: 0.8 mg/dL (ref 0.61–1.24)

## 2021-09-19 MED ORDER — IOHEXOL 300 MG/ML  SOLN
85.0000 mL | Freq: Once | INTRAMUSCULAR | Status: AC | PRN
  Administered 2021-09-19: 14:00:00 85 mL via INTRAVENOUS

## 2021-09-20 ENCOUNTER — Other Ambulatory Visit: Payer: Self-pay | Admitting: Physician Assistant

## 2021-09-20 DIAGNOSIS — C20 Malignant neoplasm of rectum: Secondary | ICD-10-CM

## 2021-09-20 NOTE — Progress Notes (Signed)
Coordinating with GI for management plan CT results sent to GI and surgical team   Ambulatory referral to hematology/oncology services placed.   Patient to be informed of results and proposed plan-  guided by Oncology team.

## 2021-09-21 ENCOUNTER — Ambulatory Visit (INDEPENDENT_AMBULATORY_CARE_PROVIDER_SITE_OTHER): Payer: Medicare HMO

## 2021-09-21 ENCOUNTER — Encounter: Payer: Self-pay | Admitting: Internal Medicine

## 2021-09-21 ENCOUNTER — Telehealth: Payer: Self-pay

## 2021-09-21 ENCOUNTER — Other Ambulatory Visit: Payer: Self-pay

## 2021-09-21 ENCOUNTER — Encounter
Admission: RE | Admit: 2021-09-21 | Discharge: 2021-09-21 | Disposition: A | Discharge status: Home / Self Care | Payer: Medicare HMO | Source: Ambulatory Visit | Attending: Cardiology | Admitting: Cardiology

## 2021-09-21 DIAGNOSIS — I2510 Atherosclerotic heart disease of native coronary artery without angina pectoris: Secondary | ICD-10-CM

## 2021-09-21 LAB — ECHOCARDIOGRAM COMPLETE
AR max vel: 3.51 cm2
AV Area VTI: 4.43 cm2
AV Area mean vel: 3.51 cm2
AV Mean grad: 2 mmHg
AV Peak grad: 3.1 mmHg
Ao pk vel: 0.88 m/s
Area-P 1/2: 2.61 cm2
Calc EF: 68.8 %
S' Lateral: 3.3 cm
Single Plane A2C EF: 68.4 %
Single Plane A4C EF: 71.9 %

## 2021-09-21 MED ORDER — REGADENOSON 0.4 MG/5ML IV SOLN
0.4000 mg | Freq: Once | INTRAVENOUS | Status: AC
  Administered 2021-09-21: 09:00:00 0.4 mg via INTRAVENOUS

## 2021-09-21 MED ORDER — TECHNETIUM TC 99M TETROFOSMIN IV KIT
10.3600 | PACK | Freq: Once | INTRAVENOUS | Status: AC | PRN
  Administered 2021-09-21: 08:00:00 10.36 via INTRAVENOUS

## 2021-09-21 MED ORDER — TECHNETIUM TC 99M TETROFOSMIN IV KIT
32.5300 | PACK | Freq: Once | INTRAVENOUS | Status: AC | PRN
  Administered 2021-09-21: 09:00:00 32.53 via INTRAVENOUS

## 2021-09-21 NOTE — Telephone Encounter (Signed)
Called pt no answer unable to leave vm (not setup).

## 2021-09-21 NOTE — Telephone Encounter (Signed)
GI was able to reach him at an alternate number. Thanks for trying

## 2021-09-21 NOTE — Telephone Encounter (Signed)
-----   Message from Almon Register, PA-C sent at 09/21/2021  9:13 AM EST ----- Regarding: Update with colon cancer/CT results This patient has a recent CT scan that indicates colon cancer.  I have reached out to GI (Dr. Bonna Gains) and we have coordinated a follow up/referral plan with his surgeon (Dr. Johney Maine) and the oncology department. Unfortunately GI office was not able to reach him to explain the CT results or the plan for management. Can we try calling him from [facility] with hopes he will pick up that number? Thank you!

## 2021-09-22 ENCOUNTER — Encounter: Payer: Self-pay | Admitting: Gastroenterology

## 2021-09-22 ENCOUNTER — Other Ambulatory Visit: Payer: Self-pay

## 2021-09-22 LAB — NM MYOCAR MULTI W/SPECT W/WALL MOTION / EF
LV dias vol: 51 mL (ref 62–150)
LV sys vol: 23 mL
MPHR: 152 {beats}/min
Nuc Stress EF: 55 %
Peak HR: 91 {beats}/min
Percent HR: 59 %
Rest HR: 85 {beats}/min
Rest Nuclear Isotope Dose: 10.4 mCi
SDS: 1
SRS: 2
SSS: 0
ST Depression (mm): 0 mm
Stress Nuclear Isotope Dose: 32.5 mCi
TID: 1.19

## 2021-09-22 NOTE — Progress Notes (Signed)
Patient with history of large rectal polyp, TVA with high-grade dysplasia, s/p TAMIS, with recurrence of rectal polyp and pt was awaiting surgery for this.  CT was done in anticipation of pending surgery, and was ordered by PCP. PCP forwarded me the results and tried to reach the pt to discuss the results with them as well.  I discussed the findings with patient and his family member Anne Ng at patient's request, on the phone together.  I explained the importance of close follow-up with oncology and an oncology appointment has already been made with Dr. Rogue Bussing.  Results have been forwarded to his surgeon as well as they were awaiting the scan for surgical plans. Pt to follow up with Dr. Johney Maine as well  All their questions were answered to their satisfaction

## 2021-09-22 NOTE — Telephone Encounter (Signed)
Janan Ridge, [place]  09/22/2021 12:29 PM EST Back to Top    Called patient.  No answer. No VM setup.  Phone note started.    Kate Sable, MD  09/22/2021 11:35 AM EST     Echo shows normal systolic function. No gross structural abnormalities   If lexiscan myoview is without ischemia, ok to proceed with surgery from a cardiac pespective.

## 2021-09-26 ENCOUNTER — Encounter: Payer: Self-pay | Admitting: Cardiology

## 2021-09-26 ENCOUNTER — Ambulatory Visit (INDEPENDENT_AMBULATORY_CARE_PROVIDER_SITE_OTHER): Payer: Medicare HMO | Admitting: Cardiology

## 2021-09-26 ENCOUNTER — Other Ambulatory Visit: Payer: Self-pay

## 2021-09-26 VITALS — BP 110/60 | HR 82 | Ht 70.0 in | Wt 127.5 lb

## 2021-09-26 DIAGNOSIS — Z01818 Encounter for other preprocedural examination: Secondary | ICD-10-CM | POA: Diagnosis not present

## 2021-09-26 DIAGNOSIS — E7800 Pure hypercholesterolemia, unspecified: Secondary | ICD-10-CM

## 2021-09-26 DIAGNOSIS — I2510 Atherosclerotic heart disease of native coronary artery without angina pectoris: Secondary | ICD-10-CM

## 2021-09-26 DIAGNOSIS — F17200 Nicotine dependence, unspecified, uncomplicated: Secondary | ICD-10-CM | POA: Diagnosis not present

## 2021-09-26 NOTE — Patient Instructions (Signed)
Medication Instructions:  Your physician recommends that you continue on your current medications as directed. Please refer to the Current Medication list given to you today.   *If you need a refill on your cardiac medications before your next appointment, please call your pharmacy*   Lab Work: None ordered  If you have labs (blood work) drawn today and your tests are completely normal, you will receive your results only by: [place] (if you have MyChart) OR A paper copy in the mail If you have any lab test that is abnormal or we need to change your treatment, we will call you to review the results.   Testing/Procedures: None ordered   Follow-Up: At [facility], you and your health needs are our priority.  As part of our continuing mission to provide you with exceptional heart care, we have created designated Provider Care Teams.  These Care Teams include your primary Cardiologist (physician) and Advanced Practice Providers (APPs -  Physician Assistants and Nurse Practitioners) who all work together to provide you with the care you need, when you need it.  We recommend signing up for the patient portal called "MyChart".  Sign up information is provided on this After Visit Summary.  MyChart is used to connect with patients for Virtual Visits (Telemedicine).  Patients are able to view lab/test results, encounter notes, upcoming appointments, etc.  Non-urgent messages can be sent to your provider as well.   To learn more about what you can do with MyChart, go to [email or web address].    Your next appointment:    Your physician wants you to follow-up in: 1 year You will receive a reminder letter in the mail two months in advance. If you don't receive a letter, please call our office to schedule the follow-up appointment.   The format for your next appointment:   In Person  Provider:   You may see Kate Sable, MD or one of the following Advanced Practice Providers on  your designated Care Team:   Murray Hodgkins, NP Christell Faith, PA-C Cadence Kathlen Mody, PA-C  :1}    Other Instructions N/A

## 2021-09-26 NOTE — Progress Notes (Signed)
Cardiology Office Note:    Date:  09/26/2021   ID:  Roberto Collins, DOB 1951/04/19, MRN [id number]  PCP:  [name], PA-C   [facility] Providers Cardiologist:  Kate Sable, MD     Referring MD: [name], PA-C   Chief Complaint  Patient presents with   Other    F/u echo and myoview. Meds reviewed verbally with pt.     History of Present Illness:    Roberto Collins is a 70 y.o. male with a hx of CAD, emphysema, current smoker x40+ years who presents for follow-up.  He was last seen for preop evaluation.    Diagnosed with a colonic mass, resection is being planned.  Echocardiogram and Lexiscan Myoview were ordered to evaluate cardiac function and any significant ischemia due to coronary artery calcifications noted on CT chest.  He denies chest pain or shortness of breath.  He still smokes but is working on quitting.  Has an appointment next week regarding abdominal mass.  Prior notes Echo 08/2021 EF 55% Lexiscan Myoview 08/2021 no evidence for ischemia Chest CT 07/2021 coronary artery calcification, aortic calcification   Past Medical History:  Diagnosis Date   Anemia    Asthma    Back pain    Blood transfusion without reported diagnosis     Past Surgical History:  Procedure Laterality Date   BACK SURGERY  2000   COLONOSCOPY WITH PROPOFOL N/A 04/02/2019   Procedure: COLONOSCOPY WITH PROPOFOL;  Surgeon: Virgel Manifold, MD;  Location: [facility] ENDOSCOPY;  Service: Endoscopy;  Laterality: N/A;   COLONOSCOPY WITH PROPOFOL N/A 04/20/2020   Procedure: COLONOSCOPY WITH PROPOFOL;  Surgeon: Virgel Manifold, MD;  Location: [facility] ENDOSCOPY;  Service: Endoscopy;  Laterality: N/A;   COLONOSCOPY WITH PROPOFOL N/A 07/28/2021   Procedure: COLONOSCOPY WITH PROPOFOL;  Surgeon: Virgel Manifold, MD;  Location: [facility] ENDOSCOPY;  Service: Endoscopy;  Laterality: N/A;   ESOPHAGOGASTRODUODENOSCOPY Left 03/31/2019   Procedure: ESOPHAGOGASTRODUODENOSCOPY (EGD);  Surgeon:  Virgel Manifold, MD;  Location[facility] ENDOSCOPY;  Service: Endoscopy;  Laterality: Left;   GIVENS CAPSULE STUDY N/A 04/02/2019   Procedure: GIVENS CAPSULE STUDY;  Surgeon: Virgel Manifold, MD;  Location: [facility] ENDOSCOPY;  Service: Endoscopy;  Laterality: N/A;   XI ROBOT ASSISTED TRANSANAL RESECTION N/A 06/25/2019   Procedure: XI ROBOT ASSISTED PARTIAL PROCTECTOMY OF RECTAL MASS USING TAMIS;  Surgeon: [name], MD;  Location: [facility] ORS;  Service: General;  Laterality: N/A;    Current Medications: Current Meds  Medication Sig   albuterol (VENTOLIN HFA) 108 (90 Base) MCG/ACT inhaler Inhale 2 puffs into the lungs every 6 (six) hours as needed for wheezing or shortness of breath.   aspirin EC 81 MG tablet Take 81 mg by mouth daily. Swallow whole.   chlorproMAZINE (THORAZINE) 100 MG tablet Take 300 mg by mouth at bedtime.   Cholecalciferol (VITAMIN D) 50 MCG (2000 UT) CAPS Take 2,000 Units by mouth daily.   cyclobenzaprine (FLEXERIL) 10 MG tablet Take 10 mg by mouth at bedtime.   fluticasone-salmeterol (ADVAIR) 250-50 MCG/ACT AEPB Inhale 1 puff into the lungs in the morning and at bedtime.   gabapentin (NEURONTIN) 600 MG tablet Take 1,800 mg by mouth at bedtime.    morphine (MS CONTIN) 60 MG 12 hr tablet Take 60 mg by mouth every 12 (twelve) hours.   oxyCODONE-acetaminophen (PERCOCET/ROXICET) 5-325 MG tablet Take 1 tablet by mouth every 4 (four) hours as needed for severe pain.   rOPINIRole (REQUIP) 1 MG tablet Take 1 mg by mouth  at bedtime as needed.   rosuvastatin (CRESTOR) 5 MG tablet Take 1 tablet (5 mg total) by mouth at bedtime.     Allergies:   Bee venom and Penicillins   Social History   Socioeconomic History   Marital status: Single    Spouse name: Not on file   Number of children: 0   Years of education: Not on file   Highest education level: Not on file  Occupational History   Occupation: disabled  Tobacco Use   Smoking status: Every Day    Packs/day: 1.00    Years:  40.00    Pack years: 40.00    Types: Cigarettes    Start date: 08/02/2009   Smokeless tobacco: Never   Tobacco comments:    [place] smoking cessation program info provided  Vaping Use   Vaping Use: Never used  Substance and Sexual Activity   Alcohol use: Not Currently   Drug use: Never   Sexual activity: Not Currently  Other Topics Concern   Not on file  Social History Narrative   Lives alone; used to roofing/ disabled; no children   Social Determinants of Radio broadcast assistant Strain: Low Risk    Difficulty of Paying Living Expenses: Not hard at all  Food Insecurity: No Food Insecurity   Worried About Charity fundraiser in the Last Year: Never true   [place] in the Last Year: Never true  Transportation Needs: No Transportation Needs   Lack of Transportation (Medical): No   Lack of Transportation (Non-Medical): No  Physical Activity: Insufficiently Active   Days of Exercise per Week: 3 days   Minutes of Exercise per Session: 30 min  Stress: No Stress Concern Present   Feeling of Stress : Not at all  Social Connections: Socially Isolated   Frequency of Communication with Friends and Family: More than three times a week   Frequency of Social Gatherings with Friends and Family: Once a week   Attends Religious Services: Never   Marine scientist or Organizations: No   Attends Archivist Meetings: Never   Marital Status: Never married     Family History: The patient's family history includes Liver cancer in his father.  ROS:   Please see the history of present illness.     All other systems reviewed and are negative.  EKGs/Labs/Other Studies Reviewed:    The following studies were reviewed today:   EKG:  EKG is  ordered today.  The ekg ordered today demonstrates normal sinus rhythm  Recent Labs: 02/08/2021: Hemoglobin 15.3; Platelets 245 05/11/2021: ALT 20; BUN 12; Potassium 4.6; Sodium 134 09/19/2021: Creatinine, Ser 0.80  Recent  Lipid Panel    Component Value Date/Time   CHOL 109 05/11/2021 1615   TRIG 99 05/11/2021 1615   HDL 41 05/11/2021 1615   CHOLHDL 2.7 05/11/2021 1615   LDLCALC 50 05/11/2021 1615     Risk Assessment/Calculations:          Physical Exam:    VS:  BP 110/60 (BP Location: Left Arm, Patient Position: Sitting, Cuff Size: Normal)    Pulse 82    Ht 5\' 10"  (1.778 m)    Wt 127 lb 8 oz (57.8 kg)    SpO2 97%    BMI 18.29 kg/m     Wt Readings from Last 3 Encounters:  09/26/21 127 lb 8 oz (57.8 kg)  08/17/21 124 lb (56.2 kg)  07/31/21 125 lb 1.6 oz (56.7 kg)  GEN:  Well nourished, well developed in no acute distress HEENT: Normal NECK: No JVD; No carotid bruits CARDIAC: RRR, no murmurs, rubs, gallops RESPIRATORY: Expiratory wheezing, rhonchi ABDOMEN: Soft, non-tender, non-distended MUSCULOSKELETAL:  No edema; No deformity  SKIN: Warm and dry NEUROLOGIC:  Alert and oriented x 3 PSYCHIATRIC:  Normal affect   ASSESSMENT:    1. Pre-op evaluation   2. Coronary artery disease involving native coronary artery of native heart, unspecified whether angina present   3. Pure hypercholesterolemia   4. Smoking     PLAN:    In order of problems listed above:  Preop evaluation for for possible colonic resection.  Echo with normal ejection fraction, EF 55%.  Lexiscan Myoview with no ischemia, low risk.  Okay to proceed with surgery from a cardiac perspective. CAD, coronary artery calcifications on chest CT. continue aspirin, Crestor.  LDL at goal. Hyperlipidemia, cholesterol controlled, continue Crestor. Current smoker, cessation advised.  Follow-up yearly    Medication Adjustments/Labs and Tests Ordered: Current medicines are reviewed at length with the patient today.  Concerns regarding medicines are outlined above.  No orders of the defined types were placed in this encounter.   No orders of the defined types were placed in this encounter.    Patient Instructions  Medication  Instructions:  Your physician recommends that you continue on your current medications as directed. Please refer to the Current Medication list given to you today.   *If you need a refill on your cardiac medications before your next appointment, please call your pharmacy*   Lab Work: None ordered  If you have labs (blood work) drawn today and your tests are completely normal, you will receive your results only by: [place] (if you have MyChart) OR A paper copy in the mail If you have any lab test that is abnormal or we need to change your treatment, we will call you to review the results.   Testing/Procedures: None ordered   Follow-Up: At [facility], you and your health needs are our priority.  As part of our continuing mission to provide you with exceptional heart care, we have created designated Provider Care Teams.  These Care Teams include your primary Cardiologist (physician) and Advanced Practice Providers (APPs -  Physician Assistants and Nurse Practitioners) who all work together to provide you with the care you need, when you need it.  We recommend signing up for the patient portal called "MyChart".  Sign up information is provided on this After Visit Summary.  MyChart is used to connect with patients for Virtual Visits (Telemedicine).  Patients are able to view lab/test results, encounter notes, upcoming appointments, etc.  Non-urgent messages can be sent to your provider as well.   To learn more about what you can do with MyChart, go to [email or web address].    Your next appointment:    Your physician wants you to follow-up in: 1 year You will receive a reminder letter in the mail two months in advance. If you don't receive a letter, please call our office to schedule the follow-up appointment.   The format for your next appointment:   In Person  Provider:   You may see Kate Sable, MD or one of the following Advanced Practice Providers on your  designated Care Team:   Murray Hodgkins, NP Christell Faith, PA-C Cadence Kathlen Mody, PA-C  :1}    Other Instructions N/A    Signed, Kate Sable, MD  09/26/2021 3:49 PM    [place]

## 2021-10-03 ENCOUNTER — Other Ambulatory Visit: Payer: Self-pay

## 2021-10-03 ENCOUNTER — Encounter: Payer: Self-pay | Admitting: Internal Medicine

## 2021-10-03 ENCOUNTER — Inpatient Hospital Stay: Payer: Medicare PPO

## 2021-10-03 ENCOUNTER — Inpatient Hospital Stay: Payer: Medicare PPO | Attending: Internal Medicine | Admitting: Internal Medicine

## 2021-10-03 VITALS — BP 128/81 | HR 99 | Temp 96.8°F | Ht 70.0 in | Wt 127.0 lb

## 2021-10-03 DIAGNOSIS — R5383 Other fatigue: Secondary | ICD-10-CM | POA: Insufficient documentation

## 2021-10-03 DIAGNOSIS — G8929 Other chronic pain: Secondary | ICD-10-CM | POA: Insufficient documentation

## 2021-10-03 DIAGNOSIS — K635 Polyp of colon: Secondary | ICD-10-CM | POA: Insufficient documentation

## 2021-10-03 DIAGNOSIS — J449 Chronic obstructive pulmonary disease, unspecified: Secondary | ICD-10-CM | POA: Diagnosis not present

## 2021-10-03 DIAGNOSIS — R188 Other ascites: Secondary | ICD-10-CM | POA: Insufficient documentation

## 2021-10-03 DIAGNOSIS — Z818 Family history of other mental and behavioral disorders: Secondary | ICD-10-CM | POA: Insufficient documentation

## 2021-10-03 DIAGNOSIS — M47816 Spondylosis without myelopathy or radiculopathy, lumbar region: Secondary | ICD-10-CM | POA: Diagnosis not present

## 2021-10-03 DIAGNOSIS — J90 Pleural effusion, not elsewhere classified: Secondary | ICD-10-CM | POA: Diagnosis not present

## 2021-10-03 DIAGNOSIS — Z79899 Other long term (current) drug therapy: Secondary | ICD-10-CM | POA: Insufficient documentation

## 2021-10-03 DIAGNOSIS — R970 Elevated carcinoembryonic antigen [CEA]: Secondary | ICD-10-CM | POA: Insufficient documentation

## 2021-10-03 DIAGNOSIS — M549 Dorsalgia, unspecified: Secondary | ICD-10-CM | POA: Insufficient documentation

## 2021-10-03 DIAGNOSIS — I2510 Atherosclerotic heart disease of native coronary artery without angina pectoris: Secondary | ICD-10-CM | POA: Diagnosis not present

## 2021-10-03 DIAGNOSIS — Z91030 Bee allergy status: Secondary | ICD-10-CM | POA: Insufficient documentation

## 2021-10-03 DIAGNOSIS — Z800 Family history of malignant neoplasm of digestive organs: Secondary | ICD-10-CM | POA: Diagnosis not present

## 2021-10-03 DIAGNOSIS — K621 Rectal polyp: Secondary | ICD-10-CM | POA: Diagnosis not present

## 2021-10-03 DIAGNOSIS — K5730 Diverticulosis of large intestine without perforation or abscess without bleeding: Secondary | ICD-10-CM | POA: Insufficient documentation

## 2021-10-03 DIAGNOSIS — C786 Secondary malignant neoplasm of retroperitoneum and peritoneum: Secondary | ICD-10-CM

## 2021-10-03 DIAGNOSIS — C20 Malignant neoplasm of rectum: Secondary | ICD-10-CM | POA: Diagnosis present

## 2021-10-03 DIAGNOSIS — I700 Atherosclerosis of aorta: Secondary | ICD-10-CM | POA: Insufficient documentation

## 2021-10-03 DIAGNOSIS — Z880 Allergy status to penicillin: Secondary | ICD-10-CM | POA: Diagnosis not present

## 2021-10-03 DIAGNOSIS — F17210 Nicotine dependence, cigarettes, uncomplicated: Secondary | ICD-10-CM | POA: Insufficient documentation

## 2021-10-03 LAB — COMPREHENSIVE METABOLIC PANEL
ALT: 19 U/L (ref 0–44)
AST: 28 U/L (ref 15–41)
Albumin: 3.7 g/dL (ref 3.5–5.0)
Alkaline Phosphatase: 78 U/L (ref 38–126)
Anion gap: 11 (ref 5–15)
BUN: 20 mg/dL (ref 8–23)
CO2: 30 mmol/L (ref 22–32)
Calcium: 9.3 mg/dL (ref 8.9–10.3)
Chloride: 93 mmol/L — ABNORMAL LOW (ref 98–111)
Creatinine, Ser: 0.84 mg/dL (ref 0.61–1.24)
GFR, Estimated: >60 mL/min (ref >60)
Glucose, Bld: 146 mg/dL — ABNORMAL HIGH (ref 70–99)
Potassium: 4.4 mmol/L (ref 3.5–5.1)
Sodium: 134 mmol/L — ABNORMAL LOW (ref 135–145)
Total Bilirubin: 0.4 mg/dL (ref 0.3–1.2)
Total Protein: 8 g/dL (ref 6.5–8.1)

## 2021-10-03 LAB — CBC WITH DIFFERENTIAL/PLATELET
Abs Immature Granulocytes: 0.02 10*3/uL (ref 0.00–0.07)
Basophils Absolute: 0 10*3/uL (ref 0.0–0.1)
Basophils Relative: 1 %
Eosinophils Absolute: 0 10*3/uL (ref 0.0–0.5)
Eosinophils Relative: 0 %
HCT: 38 % — ABNORMAL LOW (ref 39.0–52.0)
Hemoglobin: 12.7 g/dL — ABNORMAL LOW (ref 13.0–17.0)
Immature Granulocytes: 0 %
Lymphocytes Relative: 10 %
Lymphs Abs: 0.7 10*3/uL (ref 0.7–4.0)
MCH: 29.6 pg (ref 26.0–34.0)
MCHC: 33.4 g/dL (ref 30.0–36.0)
MCV: 88.6 fL (ref 80.0–100.0)
Monocytes Absolute: 0.5 10*3/uL (ref 0.1–1.0)
Monocytes Relative: 8 %
Neutro Abs: 5.2 10*3/uL (ref 1.7–7.7)
Neutrophils Relative %: 81 %
Platelets: 411 10*3/uL — ABNORMAL HIGH (ref 150–400)
RBC: 4.29 MIL/uL (ref 4.22–5.81)
RDW: 13.1 % (ref 11.5–15.5)
WBC: 6.4 10*3/uL (ref 4.0–10.5)
nRBC: 0 % (ref 0.0–0.2)

## 2021-10-03 LAB — PSA: Prostatic Specific Antigen: 0.96 ng/mL (ref 0.00–4.00)

## 2021-10-03 LAB — LACTATE DEHYDROGENASE: LDH: 89 U/L — ABNORMAL LOW (ref 98–192)

## 2021-10-03 NOTE — Assessment & Plan Note (Addendum)
#  Omental caking/peritoneal thickening/ascites-elevated tumor marker CEA 56-concerning for malignancy vs other causes.  Recommend further evaluation with PET scan; also paracentesis ordered.  #Await paracentesis-inconclusive/inadequate would recommend omental biopsy/after the PET scan.   #The etiology of omental thickening/paracentesis unclear-as patient's rectal polyps-showed no evidence of any invasive malignancy; 2020-    #15 mm rectal polyp-s/p colonoscopy-[OCT 2022]; negative for malignancy/or high-grade dysplasia however given superficial biopsy recommended excision.  S/p evaluation with surgery.  However await surgery given above significant findings peritoneal carcinomatosis/omental caking.  I have sent in my chart message to the surgeon/GI to discuss further.  # COPD-chronic stable.  # DISPOSITION: SIL- Anneette- [phone]/cell; H- [phone] # labs today-  # PET ASAP # paracentesis ASAP # follow up 10 days- MD; No labs-Dr.B  # I reviewed the blood work- with the patient in detail; also reviewed the imaging independently [as summarized above]; and with the patient in detail.    Cc; Dr.tahiliani/[place]/Dr.Gross

## 2021-10-03 NOTE — Progress Notes (Signed)
[facility] NOTE  Patient Care Team: [name], PA-C as PCP - General (Family Medicine) Kate Sable, MD as PCP - Cardiology (Cardiology) Virgel Manifold, MD as Consulting Physician (Gastroenterology) Luetta Nutting Sharyn Dross, MD as Referring Physician (Pain Medicine)  CHIEF COMPLAINTS/PURPOSE OF CONSULTATION: RECTAL MASS   Oncology History Overview Note  # JULy 2020- RECTAL MASS-frond-like/villous non-obstructing large mass partially circumferential (involving one half of the lumen circumference; Dr.Tahiliani]; CT C/A/P- NEGATIVE METASTATIC DISEASE; Bx- FRAGMENTS OF TUBULOVILLOUS ADENOMA WITH FOCAL HIGH-GRADE DYSPLASIA; - INVASIVE CARCINOMA NOT IDENTIFIED.   # July 2020- Severe IDA- Hb 3.5 [s/p PRBC]  SURGICAL PATHOLOGY  CASE: [id number]  PATIENT: [name]  Surgical Pathology Report      Specimen Submitted:  A. Colon polyp x8, transverse; cold snare  B. Colon polyp x2, sigmoid; cold snare  C. Rectum polyp; cbx   Clinical History: Colon cancer screening Z12.11.  History of colonic  polyps Z86.010.  Colon polyps       DIAGNOSIS:  A. COLON POLYPS X8, TRANSVERSE; COLD SNARE:  - MULTIPLE FRAGMENTS OF TUBULAR ADENOMAS.  - NEGATIVE FOR HIGH-GRADE DYSPLASIA OR MALIGNANCY.   B. COLON POLYPS X2, SIGMOID; COLD SNARE:  - MULTIPLE FRAGMENTS OF TUBULAR ADENOMAS.  - NEGATIVE FOR HIGH-GRADE DYSPLASIA AND MALIGNANCY.   C. RECTAL POLYP; COLD BIOPSY:  - SUPERFICIAL STRIPS OF TUBULOVILLOUS ADENOMA.  - DEFINITE HIGH-GRADE DYSPLASIA IS NOT IDENTIFIED.  - NEGATIVE FOR MALIGNANCY IN A LIMITED SAMPLING.   Comment:  The patient's prior history of villous adenoma with high-grade dysplasia  involving the rectum is noted.  Biopsy sections from the rectal polyp  display superficial strips of villous tissue with adenomatous changes.  Definite high-grade dysplasia and malignancy are not identified in the  current specimen.  However, evaluation is limited  by the superficial  nature of the sampling.  Clinical and endoscopic correlation is  recommended.   IMPRESSION: 1. 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 2. Amorphous ill-defined hypodense masslike density at the lower rectum which may represent the known rectal cancer, limited visualization. 3. Other ancillary findings as described.     Electronically Signed   By: Ofilia Neas M.D.   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  Accompanied by his sister-in-law. Roberto Collins 71 y.o.  male longstanding history of smoking/active smoker/COPD-; and history of high-grade rectal polyp s/p resection is being referred to [place] for further evaluation regarding recent findings noted on CT scan.  Patient had a repeat colonoscopy in October 2022 that showed a 15 mm polyp-negative for malignancy however recommended excision. In the interim patient has been evaluated by surgery.  However,  as part of work-up patient had a CAT scan that showed ascites omental caking. CEA elevated at 56.  Patient has been referred to [place] for further evaluation recommendations.  Patient denies any nausea vomiting abdominal pain.  Denies any abdominal distention.  His appetite is good.   Review of Systems  Constitutional:  Positive for malaise/fatigue and weight loss. Negative for chills, diaphoresis and fever.  HENT:  Negative for nosebleeds and sore throat.   Eyes:  Negative for double vision.  Respiratory:  Negative for cough, hemoptysis, sputum production, shortness of breath and  wheezing.   Cardiovascular:  Negative for chest pain, palpitations, orthopnea and leg  swelling.  Gastrointestinal:  Negative for abdominal pain, blood in stool, constipation, diarrhea, heartburn, melena, nausea and vomiting.  Genitourinary:  Negative for dysuria, frequency and urgency.  Musculoskeletal:  Positive for back pain. Negative for joint pain.  Skin: Negative.  Negative for itching and rash.  Neurological:  Negative for dizziness, tingling, focal weakness, weakness and headaches.  Endo/Heme/Allergies:  Does not bruise/bleed easily.  Psychiatric/Behavioral:  Negative for depression. The patient is not nervous/anxious and does not have insomnia.     MEDICAL HISTORY:  Past Medical History:  Diagnosis Date   Anemia    Asthma    Back pain    Blood transfusion without reported diagnosis     SURGICAL HISTORY: Past Surgical History:  Procedure Laterality Date   BACK SURGERY  2000   COLONOSCOPY WITH PROPOFOL N/A 04/02/2019   Procedure: COLONOSCOPY WITH PROPOFOL;  Surgeon: Virgel Manifold, MD;  Location: [facility] ENDOSCOPY;  Service: Endoscopy;  Laterality: N/A;   COLONOSCOPY WITH PROPOFOL N/A 04/20/2020   Procedure: COLONOSCOPY WITH PROPOFOL;  Surgeon: Virgel Manifold, MD;  Location: [facility] ENDOSCOPY;  Service: Endoscopy;  Laterality: N/A;   COLONOSCOPY WITH PROPOFOL N/A 07/28/2021   Procedure: COLONOSCOPY WITH PROPOFOL;  Surgeon: Virgel Manifold, MD;  Location: [facility] ENDOSCOPY;  Service: Endoscopy;  Laterality: N/A;   ESOPHAGOGASTRODUODENOSCOPY Left 03/31/2019   Procedure: ESOPHAGOGASTRODUODENOSCOPY (EGD);  Surgeon: Virgel Manifold, MD;  Location: [facility] ENDOSCOPY;  Service: Endoscopy;  Laterality: Left;   GIVENS CAPSULE STUDY N/A 04/02/2019   Procedure: GIVENS CAPSULE STUDY;  Surgeon: Virgel Manifold, MD;  Location: [facility] ENDOSCOPY;  Service: Endoscopy;  Laterality: N/A;   XI ROBOT ASSISTED TRANSANAL RESECTION N/A 06/25/2019   Procedure: XI ROBOT ASSISTED PARTIAL PROCTECTOMY OF RECTAL MASS USING TAMIS;  Surgeon: [name], MD;  Location: [facility] ORS;   Service: General;  Laterality: N/A;    SOCIAL HISTORY: Social History   Socioeconomic History   Marital status: Single    Spouse name: Not on file   Number of children: 0   Years of education: Not on file   Highest education level: Not on file  Occupational History   Occupation: disabled  Tobacco Use   Smoking status: Every Day    Packs/day: 1.00    Years: 40.00    Pack years: 40.00    Types: Cigarettes    Start date: 08/02/2009   Smokeless tobacco: Never   Tobacco comments:    [place] smoking cessation program info provided  Vaping Use   Vaping Use: Never used  Substance and Sexual Activity   Alcohol use: Not Currently   Drug use: Never   Sexual activity: Not Currently  Other Topics Concern   Not on file  Social History Narrative   Lives alone; used to roofing/ disabled; no children   Social Determinants of Radio broadcast assistant Strain: Low Risk    Difficulty of Paying Living Expenses: Not hard at all  Food Insecurity: No Food Insecurity   Worried About Charity fundraiser in the Last Year: Never true   Ran Out of Food in the Last Year: Never true  Transportation Needs: No Transportation Needs   Lack of Transportation (Medical): No   Lack of Transportation (Non-Medical): No  Physical Activity: Insufficiently Active   Days of Exercise per Week: 3 days   Minutes of Exercise per Session: 30 min  Stress: No Stress Concern Present   Feeling of  Stress : Not at all  Social Connections: Socially Isolated   Frequency of Communication with Friends and Family: More than three times a week   Frequency of Social Gatherings with Friends and Family: Once a week   Attends Religious Services: Never   Marine scientist or Organizations: No   Attends Music therapist: Never   Marital Status: Never married  Human resources officer Violence: Not At Risk   Fear of Current or Ex-Partner: No   Emotionally Abused: No   Physically Abused: No   Sexually Abused:  No    FAMILY HISTORY: Family History  Problem Relation Age of Onset   Dementia Mother    Liver cancer Father     ALLERGIES:  is allergic to bee venom and penicillins.  MEDICATIONS:  Current Outpatient Medications  Medication Sig Dispense Refill   albuterol (VENTOLIN HFA) 108 (90 Base) MCG/ACT inhaler Inhale 2 puffs into the lungs every 6 (six) hours as needed for wheezing or shortness of breath.     aspirin EC 81 MG tablet Take 81 mg by mouth daily. Swallow whole.     cyclobenzaprine (FLEXERIL) 10 MG tablet Take 10 mg by mouth at bedtime.     gabapentin (NEURONTIN) 600 MG tablet Take 1,800 mg by mouth at bedtime.      morphine (MS CONTIN) 60 MG 12 hr tablet Take 60 mg by mouth every 12 (twelve) hours.     rosuvastatin (CRESTOR) 5 MG tablet Take 1 tablet (5 mg total) by mouth at bedtime. 90 tablet 3   Sod Picosulfate-Mag Ox-Cit Acd (CLENPIQ) 10-3.5-12 MG-GM -GM/160ML SOLN Take 1 kit by mouth as directed. At 5 PM evening before procedure, drink 1 bottle of Clenpiq, hydrate, drink (5) 8 oz of water. Then do the same thing 5 hours prior to your procedure. 320 mL 0   chlorproMAZINE (THORAZINE) 100 MG tablet Take 300 mg by mouth at bedtime. (Patient not taking: Reported on 10/03/2021)     Cholecalciferol (VITAMIN D) 50 MCG (2000 UT) CAPS Take 2,000 Units by mouth daily. (Patient not taking: Reported on 10/03/2021)     fluticasone-salmeterol (ADVAIR) 250-50 MCG/ACT AEPB Inhale 1 puff into the lungs in the morning and at bedtime. (Patient not taking: Reported on 10/03/2021)     oxyCODONE-acetaminophen (PERCOCET/ROXICET) 5-325 MG tablet Take 1 tablet by mouth every 4 (four) hours as needed for severe pain. (Patient not taking: Reported on 10/03/2021)     rOPINIRole (REQUIP) 1 MG tablet Take 1 mg by mouth at bedtime as needed. (Patient not taking: Reported on 10/03/2021)     No current facility-administered medications for this visit.    PHYSICAL EXAMINATION: ECOG PERFORMANCE STATUS: 0 -  Asymptomatic  Vitals:   10/03/21 0920  BP: 128/81  Pulse: 99  Temp: (!) 96.8 F (36 C)  SpO2: 100%   Filed Weights   10/03/21 0920  Weight: 127 lb (57.6 kg)   Thin built Caucasian male patient.  Positive for ascites.  Physical Exam HENT:     Head: Normocephalic and atraumatic.     Mouth/Throat:     Pharynx: No oropharyngeal exudate.  Eyes:     Pupils: Pupils are equal, round, and reactive to light.  Cardiovascular:     Rate and Rhythm: Normal rate and regular rhythm.  Pulmonary:     Effort: No respiratory distress.     Breath sounds: No wheezing.     Comments: Decreased air entry bilaterally. Abdominal:     General: Bowel sounds are  normal. There is no distension.     Palpations: Abdomen is soft. There is no mass.     Tenderness: There is no abdominal tenderness. There is no guarding or rebound.  Musculoskeletal:        General: No tenderness. Normal range of motion.     Cervical back: Normal range of motion and neck supple.  Skin:    General: Skin is warm.  Neurological:     Mental Status: He is alert and oriented to person, place, and time.  Psychiatric:        Mood and Affect: Affect normal.     LABORATORY DATA:  I have reviewed the data as listed Lab Results  Component Value Date   WBC 6.4 10/03/2021   HGB 12.7 (L) 10/03/2021   HCT 38.0 (L) 10/03/2021   MCV 88.6 10/03/2021   PLT 411 (H) 10/03/2021   Recent Labs    02/08/21 1628 05/11/21 1615 09/19/21 1338 10/03/21 1034  NA 136 134*  --  134*  K 4.6 4.6  --  4.4  CL 99 100  --  93*  CO2 28 27  --  30  GLUCOSE 85 90  --  146*  BUN 15 12  --  20  CREATININE 1.03 0.90 0.80 0.84  CALCIUM 9.4 9.4  --  9.3  GFRNONAA 74  --   --  >60  GFRAA 86  --   --   --   PROT 7.2 6.8  --  8.0  ALBUMIN  --   --   --  3.7  AST 18 21  --  28  ALT 17 20  --  19  ALKPHOS  --   --   --  78  BILITOT 0.4 0.7  --  0.4    RADIOGRAPHIC STUDIES: I have personally reviewed the radiological images as listed and  agreed with the findings in the report. CT Abdomen Pelvis W Contrast  Result Date: 09/20/2021 CLINICAL DATA:  History of rectal cancer. Abnormal chest CT follow-up. EXAM: CT ABDOMEN AND PELVIS WITH CONTRAST TECHNIQUE: Multidetector CT imaging of the abdomen and pelvis was performed using the standard protocol following bolus administration of intravenous contrast. CONTRAST:  41mL OMNIPAQUE IOHEXOL 300 MG/ML  SOLN COMPARISON:  CT abdomen and pelvis 04/02/2019 FINDINGS: Lower chest: Emphysematous changes of the lungs. Mild bibasilar scarring/subsegmental atelectasis. Hepatobiliary: Liver is normal in size with new scalloping/nodularity of the contour since previous study. No focal hepatic mass identified. Gallbladder appears within normal limits. No biliary ductal dilatation appreciated. Pancreas: Unremarkable. No pancreatic ductal dilatation or surrounding inflammatory changes. Spleen: Normal size with no suspicious mass. Mild scalloping of the contour. Adrenals/Urinary Tract: Adrenal glands appear normal. Symmetric perfusion of the kidneys. No suspicious renal mass or hydronephrosis identified bilaterally. Urinary bladder is incompletely distended with no obvious mass visualized. Stomach/Bowel: No bowel obstruction, free air or pneumatosis. Colonic diverticulosis without evidence of acute diverticulitis. Mild wall thickening throughout the sigmoid colon. Ill-defined hypodense masslike density surrounding the lower rectum near the rectal anal junction. Vascular/Lymphatic: Severe atherosclerotic disease. No definite bulky lymphadenopathy identified, limited evaluation. Reproductive: Prostate gland appears to be normal size. Other: Large volume ascites which measures 25-30 Hounsfield unit density. Diffuse peritoneal irregular thickening and mild enhancement consistent with carcinomatosis. Extensive omental caking with mild enhancement. Musculoskeletal: Postsurgical and degenerative changes of the lumbar spine. No  suspicious bony lesions identified. IMPRESSION: 1. 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 2. Amorphous ill-defined hypodense masslike density at the lower rectum which may represent the known rectal cancer, limited visualization. 3. Other ancillary findings as described. Electronically Signed   By: Ofilia Neas M.D.   On: 09/20/2021 10:41   NM Myocar Multi W/Spect W/Wall Motion / EF  Result Date: 09/22/2021 Pharmacological myocardial perfusion imaging study with no significant  ischemia Normal wall motion, EF estimated at 58% GI uptake artifact noted No EKG changes concerning for ischemia at peak stress or in recovery. CT attenuation correction images with mild aortic atherosclerosis, unable to exclude mild coronary calcification and ostial RCA Low risk scan Signed, Esmond Plants, MD, Ph.D [facility]   ECHOCARDIOGRAM COMPLETE  Result Date: 09/21/2021    ECHOCARDIOGRAM REPORT   Patient Name:   Roberto Collins Date of Exam: 09/21/2021 Medical Rec #:  [id number]    Height:       70.5 in Accession #:    [id number]   Weight:       124.0 lb Date of Birth:  03/23/1951     BSA:          1.713 m Patient Age:    39 years     BP:           110/72 mmHg Patient Gender: M            HR:           80 bpm. Exam Location:  [place] Procedure: 2D Echo, Cardiac Doppler and Color Doppler Indications:    I25.10 Coronary artery disease involving native coronary artery                 of native heart, unspecified whether angina present  History:        Patient has no prior history of Echocardiogram examinations.                 CAD; Risk Factors:Current Smoker.  Sonographer:    Caesar Chestnut RDCS, RVT Referring Phys: [id number] Kate Sable  Sonographer Comments: Technically difficult study due to poor echo windows. IMPRESSIONS  1. Left ventricular  ejection fraction, by estimation, is 55%. The left ventricle has normal function. The left ventricle has no regional wall motion abnormalities. Left ventricular diastolic parameters are consistent with Grade I diastolic dysfunction (impaired relaxation).  2. Right ventricular systolic function is normal. The right ventricular size is normal. There is normal pulmonary artery systolic pressure. The estimated right ventricular systolic pressure is 19.4 mmHg.  3. The mitral valve is normal in structure. Mild mitral valve regurgitation. No evidence of mitral stenosis.  4. Tricuspid valve regurgitation is mild to moderate.  5. The aortic valve was not well visualized. Aortic valve regurgitation is not visualized. No aortic stenosis is present.  6. The inferior vena cava is normal in size with greater than 50% respiratory variability, suggesting right atrial pressure of 3 mmHg. FINDINGS  Left Ventricle: Left ventricular ejection fraction, by estimation, is 55%. The left ventricle has normal function. The left ventricle has no regional wall motion abnormalities. The left ventricular internal cavity size was normal in size. There is no left ventricular hypertrophy. Left ventricular diastolic parameters are consistent with Grade I diastolic dysfunction (impaired relaxation). Right Ventricle: The right ventricular size is normal. No increase in right ventricular wall thickness. Right ventricular systolic function is normal. There is normal pulmonary artery systolic pressure. The tricuspid regurgitant velocity is 2.68 m/s, and  with an assumed right atrial pressure  of 5 mmHg, the estimated right ventricular systolic pressure is 40.9 mmHg. Left Atrium: Left atrial size was normal in size. Right Atrium: Right atrial size was normal in size. Pericardium: There is no evidence of pericardial effusion. Mitral Valve: The mitral valve is normal in structure. Mild mitral valve regurgitation. No evidence of mitral valve stenosis.  Tricuspid Valve: The tricuspid valve is normal in structure. Tricuspid valve regurgitation is mild to moderate. No evidence of tricuspid stenosis. Aortic Valve: The aortic valve was not well visualized. Aortic valve regurgitation is not visualized. No aortic stenosis is present. Aortic valve mean gradient measures 2.0 mmHg. Aortic valve peak gradient measures 3.1 mmHg. Aortic valve area, by VTI measures 4.43 cm. Pulmonic Valve: The pulmonic valve was normal in structure. Pulmonic valve regurgitation is not visualized. No evidence of pulmonic stenosis. Aorta: The aortic root is normal in size and structure. Venous: The inferior vena cava is normal in size with greater than 50% respiratory variability, suggesting right atrial pressure of 3 mmHg. IAS/Shunts: No atrial level shunt detected by color flow Doppler.  LEFT VENTRICLE PLAX 2D LVIDd:         4.70 cm     Diastology LVIDs:         3.30 cm     LV e' medial:    9.03 cm/s LV PW:         0.80 cm     LV E/e' medial:  5.8 LV IVS:        0.60 cm     LV e' lateral:   10.30 cm/s LVOT diam:     2.20 cm     LV E/e' lateral: 5.1 LV SV:         78 LV SV Index:   46 LVOT Area:     3.80 cm  LV Volumes (MOD) LV vol d, MOD A2C: 62.9 ml LV vol d, MOD A4C: 97.0 ml LV vol s, MOD A2C: 19.9 ml LV vol s, MOD A4C: 27.3 ml LV SV MOD A2C:     43.0 ml LV SV MOD A4C:     97.0 ml LV SV MOD BP:      57.6 ml RIGHT VENTRICLE RV Basal diam:  3.80 cm RV Mid diam:    2.30 cm RV S prime:     19.30 cm/s TAPSE (M-mode): 3.6 cm LEFT ATRIUM         Index       RIGHT ATRIUM           Index LA diam:    2.90 cm 1.69 cm/m  RA Area:     13.40 cm                                 RA Volume:   29.90 ml  17.46 ml/m  AORTIC VALVE                    PULMONIC VALVE AV Area (Vmax):    3.51 cm     PV Vmax:       0.85 m/s AV Area (Vmean):   3.51 cm     PV Peak grad:  2.9 mmHg AV Area (VTI):     4.43 cm AV Vmax:           88.10 cm/s AV Vmean:          60.400 cm/s AV VTI:  0.176 m AV Peak Grad:       3.1 mmHg AV Mean Grad:      2.0 mmHg LVOT Vmax:         81.30 cm/s LVOT Vmean:        55.700 cm/s LVOT VTI:          0.205 m LVOT/AV VTI ratio: 1.16  AORTA Ao Root diam: 3.00 cm Ao Asc diam:  3.00 cm MITRAL VALVE               TRICUSPID VALVE MV Area (PHT): 2.61 cm    TR Peak grad:   28.7 mmHg MV Decel Time: 291 msec    TR Vmax:        268.00 cm/s MV E velocity: 52.80 cm/s MV A velocity: 69.20 cm/s  SHUNTS MV E/A ratio:  0.76        Systemic VTI:  0.20 m                            Systemic Diam: 2.20 cm Ida Rogue MD Electronically signed by Ida Rogue MD Signature Date/Time: 09/21/2021/6:57:08 PM    Final     ASSESSMENT & PLAN:   Peritoneal carcinomatosis ([place]) #Omental caking/peritoneal thickening/ascites-elevated tumor marker CEA 56-concerning for malignancy vs other causes.  Recommend further evaluation with PET scan; also paracentesis ordered.  #Await paracentesis-inconclusive/inadequate would recommend omental biopsy/after the PET scan.   #The etiology of omental thickening/paracentesis unclear-as patient's rectal polyps-showed no evidence of any invasive malignancy; 2020-    #15 mm rectal polyp-s/p colonoscopy-[OCT 2022]; negative for malignancy/or high-grade dysplasia however given superficial biopsy recommended excision.  S/p evaluation with surgery.  However await surgery given above significant findings peritoneal carcinomatosis/omental caking.  I have sent in my chart message to the surgeon/GI to discuss further.  # COPD-chronic stable.  # DISPOSITION: SIL- [place]- [phone]/cell; H- [phone] # labs today-  # PET ASAP # paracentesis ASAP # follow up 10 days- MD; No labs-Dr.B  # I reviewed the blood work- with the patient in detail; also reviewed the imaging independently [as summarized above]; and with the patient in detail.    Cc; Dr.tahiliani/[place]/Dr.Gross  All questions were answered. The patient knows to call the clinic with any problems, questions or  concerns.    Cammie Sickle, MD 10/03/2021 4:14 PM

## 2021-10-04 ENCOUNTER — Telehealth: Payer: Self-pay | Admitting: Internal Medicine

## 2021-10-04 ENCOUNTER — Ambulatory Visit
Admission: RE | Admit: 2021-10-04 | Discharge: 2021-10-04 | Disposition: A | Discharge status: Home / Self Care | Payer: Medicare PPO | Source: Ambulatory Visit | Attending: Internal Medicine | Admitting: Internal Medicine

## 2021-10-04 ENCOUNTER — Telehealth: Payer: Self-pay

## 2021-10-04 DIAGNOSIS — C786 Secondary malignant neoplasm of retroperitoneum and peritoneum: Secondary | ICD-10-CM | POA: Insufficient documentation

## 2021-10-04 DIAGNOSIS — C20 Malignant neoplasm of rectum: Secondary | ICD-10-CM | POA: Diagnosis present

## 2021-10-04 LAB — ALBUMIN, PLEURAL OR PERITONEAL FLUID: Albumin, Fluid: 2.3 g/dL

## 2021-10-04 LAB — CEA: CEA: 668 ng/mL — ABNORMAL HIGH (ref 0.0–4.7)

## 2021-10-04 LAB — GLUCOSE, PLEURAL OR PERITONEAL FLUID: Glucose, Fluid: 57 mg/dL

## 2021-10-04 LAB — AMYLASE, PLEURAL OR PERITONEAL FLUID: Amylase, Fluid: 20 U/L

## 2021-10-04 LAB — BODY FLUID CELL COUNT WITH DIFFERENTIAL
Eos, Fluid: 18 %
Lymphs, Fluid: 44 %
Monocyte-Macrophage-Serous Fluid: 30 %
Neutrophil Count, Fluid: 8 %
Other Cells, Fluid: 0 %
Total Nucleated Cell Count, Fluid: 903 cu mm

## 2021-10-04 LAB — LACTATE DEHYDROGENASE, PLEURAL OR PERITONEAL FLUID: LD, Fluid: 179 U/L — ABNORMAL HIGH (ref 3–23)

## 2021-10-04 LAB — PROTEIN, PLEURAL OR PERITONEAL FLUID: Total protein, fluid: 4.5 g/dL

## 2021-10-04 LAB — CANCER ANTIGEN 19-9: CA 19-9: 1918 U/mL — ABNORMAL HIGH (ref 0–35)

## 2021-10-04 NOTE — Telephone Encounter (Signed)
I spoke with patient's sister-in-law-regarding my discussion with Dr. Johney Maine.  No plan for surgery at this time await further work-up for ascites/omental caking.  Follow-up as planned.

## 2021-10-04 NOTE — Procedures (Signed)
PROCEDURE SUMMARY:  Successful US guided paracentesis from RLQ.  Yielded 2 L of amber colored fluid.  No immediate complications.  Pt tolerated well.   Specimen was sent for labs.  EBL < 37mL  Tsosie Billing D PA-C 10/04/2021 1:41 PM

## 2021-10-04 NOTE — Telephone Encounter (Signed)
See other message

## 2021-10-04 NOTE — Telephone Encounter (Signed)
I have tried to contact the pt several times by both phone numbers and mychart to inform him of his  US Paracentesis  Wed 10/04/21 @1p   Arrive @12 :30p, unsuccessful with all. Unable to leave vm's as well. I tried to call again this morning with no answer/vm. Will try again this mornng.

## 2021-10-05 ENCOUNTER — Encounter: Payer: Self-pay | Admitting: Internal Medicine

## 2021-10-06 ENCOUNTER — Other Ambulatory Visit: Payer: Self-pay | Admitting: Pathology

## 2021-10-06 LAB — CYTOLOGY - NON PAP

## 2021-10-08 LAB — BODY FLUID CULTURE W GRAM STAIN: Culture: NO GROWTH

## 2021-10-09 ENCOUNTER — Telehealth: Payer: Self-pay

## 2021-10-09 ENCOUNTER — Encounter: Payer: Self-pay | Admitting: Internal Medicine

## 2021-10-09 LAB — LIPASE, FLUID: Lipase-Fluid: 8 U/L

## 2021-10-09 NOTE — Telephone Encounter (Signed)
SPOKE WITH SISTER IN LAW NO APPOINTMENT NEEDED PLEASE DISREGARD

## 2021-10-09 NOTE — Telephone Encounter (Signed)
Called patient no answer and voicemail full needs a 4 week follow up apt with wohl

## 2021-10-09 NOTE — Telephone Encounter (Signed)
Sent my chart message for a reminder to scheduled a follow up visit for 4 weeks

## 2021-10-12 ENCOUNTER — Other Ambulatory Visit: Payer: Self-pay

## 2021-10-12 ENCOUNTER — Encounter: Payer: Self-pay | Admitting: Gastroenterology

## 2021-10-12 ENCOUNTER — Encounter
Admission: RE | Admit: 2021-10-12 | Discharge: 2021-10-12 | Disposition: A | Discharge status: Home / Self Care | Payer: Medicare PPO | Source: Ambulatory Visit | Attending: Internal Medicine | Admitting: Internal Medicine

## 2021-10-12 ENCOUNTER — Telehealth: Payer: Self-pay

## 2021-10-12 ENCOUNTER — Other Ambulatory Visit: Payer: PRIVATE HEALTH INSURANCE

## 2021-10-12 DIAGNOSIS — C786 Secondary malignant neoplasm of retroperitoneum and peritoneum: Secondary | ICD-10-CM | POA: Diagnosis present

## 2021-10-12 DIAGNOSIS — C20 Malignant neoplasm of rectum: Secondary | ICD-10-CM | POA: Diagnosis present

## 2021-10-12 LAB — GLUCOSE, CAPILLARY: Glucose-Capillary: 104 mg/dL — ABNORMAL HIGH (ref 70–99)

## 2021-10-12 MED ORDER — FLUDEOXYGLUCOSE F - 18 (FDG) INJECTION
6.8000 | Freq: Once | INTRAVENOUS | Status: AC | PRN
  Administered 2021-10-12: 6.8 via INTRAVENOUS

## 2021-10-12 NOTE — Progress Notes (Signed)
Tumor Board Documentation  Roberto Collins was presented by Dr Rogue Bussing at our Tumor Board on 10/12/2021, which included representatives from medical oncology, radiology, pathology, radiation oncology, pulmonology, genetics, research, internal medicine, palliative care, navigation.  Roberto Collins currently presents as a current patient, for Roberto Collins, for new positive pathology with history of the following treatments: active survellience, surgical intervention(s).  Additionally, we reviewed previous medical and familial history, history of present illness, and recent lab results along with all available histopathologic and imaging studies. The tumor board considered available treatment options and made the following recommendations: Biopsy, Additional screening (Omental caking CT guided biopsy, Additional pathology testing)    The following procedures/referrals were also placed: No orders of the defined types were placed in this encounter.   Clinical Trial Status: not discussed   Staging used: To be determined, Pathologic Stage AJCC Staging:       Group: Metastatic Adenocarcinoma   National site-specific guidelines   were discussed with respect to the case.  Tumor board is a meeting of clinicians from various specialty areas who evaluate and discuss patients for whom a multidisciplinary approach is being considered. Final determinations in the plan of care are those of the provider(s). The responsibility for follow up of recommendations given during tumor board is that of the provider.   Roberto Collins extended care, comprehensive team conference, Roberto Collins was not present for the discussion and was not examined.   Multidisciplinary Tumor Board is a multidisciplinary case peer review process.  Decisions discussed in the Multidisciplinary Tumor Board reflect the opinions of the specialists present at the conference without having examined the patient.  Ultimately, treatment and diagnostic decisions rest with  the primary provider(s) and the patient.

## 2021-10-12 NOTE — Progress Notes (Signed)
As per latest update in patient's chart. He has been discussed in tumor board and CT guided biopsy was recommended. See Tumor board progress note. Pt has follow up with oncology next week.  See encounter summary report in regard to previous conversations and medical plan between patient's providers. Conversation summary copied here as well for information, as encounter summary difficult to readily find in patient's epic chart.  "Cammie Sickle, MD  [name], MD; Virgel Manifold, MD; Clent Jacks, RN; [name], PA-C Cc: Spillers, Lars Mage  I got it- will take care of pt. And will give the sister a call re: plan moving forward.  Thanks  GB        Previous Messages   ----- Message -----  From: [name], MD  Sent: 10/04/2021   7:55 AM EST  To: Illene Regulus, Clent Jacks, RN, *  Subject: RE: [facility] patient - recurrent rectal mass n*   Unfortunate situation    It is a challenge to get a hold of the patient at times which is why his sister has become more involved to make sure nothing is dropped.  The patient was hesitant to consider surgery but seemed to somewhat grudgingly consider it pending MRI & Scheduling.  MRI did not show any obvious rectal mass despite concerning exam & colonscopy.  CT scan was already in the works given abdominal complaints by PCP office.  I saw that Dr Bonna Gains had already been able to reach out the patient on the CT scan findings & had beaten me to a medonc referral to you.   At this point, I think his metastatic disease is more concerning.  I agree with biopsy / paracentesis  for diagnosis and then palliative chemotherapy to get things under control since the cancer is not obstructing, bleeding, perforated.  Can discuss more if you wish [phone]   Your regularly humbled & sometime obedient colleague,   Marney Doctor, MD, FACS, MASCRS  Esophageal, Gastrointestinal & Colorectal Surgery  Robotic and Minimally  Invasive Surgery   [facility], [place]  [place]. 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  Elevated CEA.     I am concerned about metastatic disease; interestingly however patient has not had any invasive rectal cancer diagnosed.   I recommend further work-up with PET scan; paracentesis.   I assume that any rectal polyp excision surgery as previously discussed with the patient/family would be held for now.  Please confirm.  Please feel free to reach me at [phone]   Thanks  GB

## 2021-10-12 NOTE — Telephone Encounter (Signed)
-----   Message from Virgel Manifold, MD sent at 10/09/2021  4:16 PM EST ----- Regarding: RE: [facility] patient - recurrent rectal mass now w ascites & peritnoneal masses Threasa Beards, just an update on this pt. He is following up with oncology as next step, already set up. I will let you know when he would need follow up with GI. Wasn't sure if you are seeing all this in his chart, but its detailed below  ----- Message ----- From: Cammie Sickle, MD Sent: 10/04/2021  12:16 PM EST To: [name], MD, Illene Regulus, # Subject: RE: [facility] patient - recurrent rectal mass n#  I got it- will take care of pt. And will give the sister a call re: plan moving forward. Thanks GB ----- Message ----- From: [name], MD Sent: 10/04/2021   7:55 AM EST To: Illene Regulus, Clent Jacks, RN, # Subject: RE: [facility] patient - recurrent rectal mass n#  Unfortunate situation   It is a challenge to get a hold of the patient at times which is why his sister has become more involved to make sure nothing is dropped.  The patient was hesitant to consider surgery but seemed to somewhat grudgingly consider it pending MRI & Scheduling.  MRI did not show any obvious rectal mass despite concerning exam & colonscopy.  CT scan was already in the works given abdominal complaints by PCP office.  I saw that Dr Bonna Gains had already been able to reach out the patient on the CT scan findings & had beaten me to a medonc referral to you.   At this point, I think his metastatic disease is more concerning.  I agree with biopsy / paracentesis  for diagnosis and then palliative chemotherapy to get things under control since the cancer is not obstructing, bleeding, perforated.  Can discuss more if you wish [phone]  Your regularly humbled & sometime obedient colleague,  Marney Doctor, MD, FACS, MASCRS Esophageal, Gastrointestinal & Colorectal Surgery Robotic and Minimally Invasive Surgery  [facility], [place]  [place]. 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  Elevated CEA.    I am concerned about metastatic disease; interestingly however patient has not had any invasive rectal cancer diagnosed.   I recommend further work-up with PET scan; paracentesis.   I assume that any rectal polyp excision surgery as previously discussed with the patient/family would be held for now.  Please confirm.  Please feel free to reach me at [phone]  Thanks GB

## 2021-10-16 ENCOUNTER — Inpatient Hospital Stay (HOSPITAL_BASED_OUTPATIENT_CLINIC_OR_DEPARTMENT_OTHER): Payer: Medicare PPO | Admitting: Internal Medicine

## 2021-10-16 ENCOUNTER — Encounter: Payer: Self-pay | Admitting: Internal Medicine

## 2021-10-16 ENCOUNTER — Other Ambulatory Visit: Payer: Self-pay

## 2021-10-16 DIAGNOSIS — C786 Secondary malignant neoplasm of retroperitoneum and peritoneum: Secondary | ICD-10-CM

## 2021-10-16 DIAGNOSIS — C20 Malignant neoplasm of rectum: Secondary | ICD-10-CM | POA: Diagnosis not present

## 2021-10-16 NOTE — Assessment & Plan Note (Addendum)
#  Omental caking/peritoneal thickening/ascites-paracentesis/cytology adenocarcinoma.  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 2. No discrete hypermetabolic rectal or colonic mass is identified.  No evidence of any hepatic or distant metastatic disease.-Except right tongue uptake/ see below.  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  Understands treatments are palliative not curative.  #Discussed that many life expectancy in the order of 1 to 2 years with treatment; and without treatment life expectancy of less than 6 months.  Patient would be hospice candidate if he chooses not to do any treatments. I discussed that FOLFOX chemotherapy is given every 2 weeks; discuss the potential side effects including but not limited to nausea vomiting diarrhea, sores in the mouth, hand-foot syndrome; also tingling and numbness/cold sensitivity with oxaliplatin.  Also discussed regarding additional biopsy for NGS; and port placement etc. after extensive discussion patient states that he needs time to decide on treatments.  They will give me a call regarding a plan in the next few days.  #Right lower chest wall pain/upper quadrant pain-likely secondary to malignancy.  Patient currently following up with pain clinic[Dr.Marks, [place]; [facility]] for chronic pain [on MS Contin; Percocet].  Recommend reaching out to pain physician with regards to worsening pain/I will be happy to talk to pain physician-coordinate care locally [if patient chooses treatment]; also hospice if patient declines treatment.  #  Large area of hypermetabolism involving the right aspect of the tongue without discrete mass seen on CT-clinically no mass seen.  Discussed regarding ENT evaluation.  See above decision.   # COPD-chronic STABLE  #Incidental findings on PET Imaging dated: JAN 13th,  2023-advance emphysema/atherosclerosis: I reviewed/discussed/counseled the patient.   # DISPOSITION: [facility]- [place]- [phone]/cell; H- [phone] # Follow up TBD-Dr.B  # I reviewed the blood work- with the patient in detail; also reviewed the imaging independently [as summarized above]; and with the patient in detail.

## 2021-10-16 NOTE — Progress Notes (Signed)
Pt states he has a pain in right lower rib cage area, cough and runny nose.

## 2021-10-16 NOTE — Progress Notes (Signed)
[facility] NOTE  Patient Care Team: [name], PA-C as PCP - General (Family Medicine) Kate Sable, MD as PCP - Cardiology (Cardiology) Virgel Manifold, MD as Consulting Physician (Gastroenterology) Luetta Nutting Sharyn Dross, MD as Referring Physician (Pain Medicine) Cammie Sickle, MD as Consulting Physician (Medical Oncology) Kate Sable, MD as Consulting Physician (Cardiology) [name], MD as Consulting Physician (Colon and Rectal Surgery)  CHIEF COMPLAINTS/PURPOSE OF CONSULTATION: RECTAL MASS   Oncology History Overview Note  # JULy 2020- RECTAL MASS-frond-like/villous non-obstructing large mass partially circumferential (involving one half of the lumen circumference; Dr.Tahiliani]; CT C/A/P- NEGATIVE METASTATIC DISEASE; Bx- FRAGMENTS OF TUBULOVILLOUS ADENOMA WITH FOCAL HIGH-GRADE DYSPLASIA; - INVASIVE CARCINOMA NOT IDENTIFIED.   # July 2020- Severe IDA- Hb 3.5 [s/p PRBC]  SURGICAL PATHOLOGY  CASE: [id number]  PATIENT: Roberto Collins  Surgical Pathology Report      Specimen Submitted:  A. Colon polyp x8, transverse; cold snare  B. Colon polyp x2, sigmoid; cold snare  C. Rectum polyp; cbx   Clinical History: Colon cancer screening Z12.11.  History of colonic  polyps Z86.010.  Colon polyps       DIAGNOSIS:  A. COLON POLYPS X8, TRANSVERSE; COLD SNARE:  - MULTIPLE FRAGMENTS OF TUBULAR ADENOMAS.  - NEGATIVE FOR HIGH-GRADE DYSPLASIA OR MALIGNANCY.   B. COLON POLYPS X2, SIGMOID; COLD SNARE:  - MULTIPLE FRAGMENTS OF TUBULAR ADENOMAS.  - NEGATIVE FOR HIGH-GRADE DYSPLASIA AND MALIGNANCY.   C. RECTAL POLYP; COLD BIOPSY:  - SUPERFICIAL STRIPS OF TUBULOVILLOUS ADENOMA.  - DEFINITE HIGH-GRADE DYSPLASIA IS NOT IDENTIFIED.  - NEGATIVE FOR MALIGNANCY IN A LIMITED SAMPLING.   Comment:  The patient's prior history of villous adenoma with high-grade dysplasia  involving the rectum is noted.  Biopsy sections from the rectal  polyp  display superficial strips of villous tissue with adenomatous changes.  Definite high-grade dysplasia and malignancy are not identified in the  current specimen.  However, evaluation is limited by the superficial  nature of the sampling.  Clinical and endoscopic correlation is  recommended.   IMPRESSION: 1. 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 2. Amorphous ill-defined hypodense masslike density at the lower rectum which may represent the known rectal cancer, limited visualization. 3. Other ancillary findings as described.     Electronically Signed   By: Ofilia Neas M.D.   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  Accompanied by his sister-in-law.  Roberto Collins 71 y.o.  male longstanding history of smoking/active smoker/COPD-; and newly diagnosed peritoneal carcinomatosis is here to review the results of his paracentesis/PET scan.  In the interim patient underwent paracentesis.   Patient complains of worsening abdominal pain/right upper quadrant for the last few months.  No nausea no vomiting.  Appetite is fair.  Review of Systems  Constitutional:  Positive for malaise/fatigue and weight loss. Negative for chills, diaphoresis and fever.  HENT:  Negative for nosebleeds and sore throat.   Eyes:  Negative for double vision.  Respiratory:  Negative for cough, hemoptysis, sputum production, shortness of breath and wheezing.   Cardiovascular:  Negative for chest pain, palpitations, orthopnea and leg swelling.  Gastrointestinal:  Negative for abdominal pain, blood in stool, constipation, diarrhea, heartburn, melena, nausea and  vomiting.  Genitourinary:  Negative for dysuria, frequency and urgency.  Musculoskeletal:  Positive for back pain. Negative for joint pain.  Skin: Negative.  Negative for itching and rash.  Neurological:  Negative for dizziness, tingling, focal weakness, weakness and headaches.  Endo/Heme/Allergies:  Does not bruise/bleed easily.  Psychiatric/Behavioral:  Negative for depression. The patient is not nervous/anxious and does not have insomnia.     MEDICAL HISTORY:  Past Medical History:  Diagnosis Date   Anemia    Asthma    Back pain    Blood transfusion without reported diagnosis     SURGICAL HISTORY: Past Surgical History:  Procedure Laterality Date   BACK SURGERY  2000   COLONOSCOPY WITH PROPOFOL N/A 04/02/2019   Procedure: COLONOSCOPY WITH PROPOFOL;  Surgeon: Virgel Manifold, MD;  Location: [facility] ENDOSCOPY;  Service: Endoscopy;  Laterality: N/A;   COLONOSCOPY WITH PROPOFOL N/A 04/20/2020   Procedure: COLONOSCOPY WITH PROPOFOL;  Surgeon: Virgel Manifold, MD;  Location: [facility] ENDOSCOPY;  Service: Endoscopy;  Laterality: N/A;   COLONOSCOPY WITH PROPOFOL N/A 07/28/2021   Procedure: COLONOSCOPY WITH PROPOFOL;  Surgeon: Virgel Manifold, MD;  Location: [facility] ENDOSCOPY;  Service: Endoscopy;  Laterality: N/A;   ESOPHAGOGASTRODUODENOSCOPY Left 03/31/2019   Procedure: ESOPHAGOGASTRODUODENOSCOPY (EGD);  Surgeon: Virgel Manifold, MD;  Location: [facility] ENDOSCOPY;  Service: Endoscopy;  Laterality: Left;   GIVENS CAPSULE STUDY N/A 04/02/2019   Procedure: GIVENS CAPSULE STUDY;  Surgeon: Virgel Manifold, MD;  Location: [facility] ENDOSCOPY;  Service: Endoscopy;  Laterality: N/A;   XI ROBOT ASSISTED TRANSANAL RESECTION N/A 06/25/2019   Procedure: XI ROBOT ASSISTED PARTIAL PROCTECTOMY OF RECTAL MASS USING TAMIS;  Surgeon: [name], MD;  Location: [facility];  Service: General;  Laterality: N/A;    SOCIAL HISTORY: Social History   Socioeconomic History   Marital status: Single    Spouse  name: Not on file   Number of children: 0   Years of education: Not on file   Highest education level: Not on file  Occupational History   Occupation: disabled  Tobacco Use   Smoking status: Every Day    Packs/day: 1.00    Years: 40.00    Pack years: 40.00    Types: Cigarettes    Start date: 08/02/2009   Smokeless tobacco: Never   Tobacco comments:    [place] smoking cessation program info provided  Vaping Use   Vaping Use: Never used  Substance and Sexual Activity   Alcohol use: Not Currently   Drug use: Never   Sexual activity: Not Currently  Other Topics Concern   Not on file  Social History Narrative   Lives alone; used to roofing/ disabled; no children   Social Determinants of Radio broadcast assistant Strain: Low Risk    Difficulty of Paying Living Expenses: Not hard at all  Food Insecurity: No Food Insecurity   Worried About Charity fundraiser in the Last Year: Never true   Ran Out of Food in the Last Year: Never true  Transportation Needs: No Transportation Needs   Lack of Transportation (Medical): No   Lack of Transportation (Non-Medical): No  Physical Activity: Insufficiently Active   Days of Exercise per Week: 3 days   Minutes of Exercise per Session: 30 min  Stress: No Stress Concern Present   Feeling of Stress : Not at all  Social Connections: Socially Isolated   Frequency of Communication with Friends and Family: More than three times a week   Frequency of Social Gatherings  with Friends and Family: Once a week   Attends Religious Services: Never   Marine scientist or Organizations: No   Attends Music therapist: Never   Marital Status: Never married  Human resources officer Violence: Not At Risk   Fear of Current or Ex-Partner: No   Emotionally Abused: No   Physically Abused: No   Sexually Abused: No    FAMILY HISTORY: Family History  Problem Relation Age of Onset   Dementia Mother    Liver cancer Father     ALLERGIES:  is  allergic to bee venom and penicillins.  MEDICATIONS:  Current Outpatient Medications  Medication Sig Dispense Refill   albuterol (VENTOLIN HFA) 108 (90 Base) MCG/ACT inhaler Inhale 2 puffs into the lungs every 6 (six) hours as needed for wheezing or shortness of breath.     aspirin EC 81 MG tablet Take 81 mg by mouth daily. Swallow whole.     Cholecalciferol (VITAMIN D) 50 MCG (2000 UT) CAPS Take 2,000 Units by mouth daily.     cyclobenzaprine (FLEXERIL) 10 MG tablet Take 10 mg by mouth at bedtime.     doxepin (SINEQUAN) 25 MG capsule Take 25 mg by mouth at bedtime.     gabapentin (NEURONTIN) 600 MG tablet Take 1,800 mg by mouth at bedtime.      morphine (MS CONTIN) 60 MG 12 hr tablet Take 60 mg by mouth every 12 (twelve) hours.     rosuvastatin (CRESTOR) 5 MG tablet Take 1 tablet (5 mg total) by mouth at bedtime. 90 tablet 3   chlorproMAZINE (THORAZINE) 100 MG tablet Take 300 mg by mouth at bedtime. (Patient not taking: Reported on 10/03/2021)     fluticasone-salmeterol (ADVAIR) 250-50 MCG/ACT AEPB Inhale 1 puff into the lungs in the morning and at bedtime. (Patient not taking: Reported on 10/03/2021)     oxyCODONE-acetaminophen (PERCOCET/ROXICET) 5-325 MG tablet Take 1 tablet by mouth every 4 (four) hours as needed for severe pain. (Patient not taking: Reported on 10/03/2021)     rOPINIRole (REQUIP) 1 MG tablet Take 1 mg by mouth at bedtime as needed. (Patient not taking: Reported on 10/03/2021)     Sod Picosulfate-Mag Ox-Cit Acd (CLENPIQ) 10-3.5-12 MG-GM -GM/160ML SOLN Take 1 kit by mouth as directed. At 5 PM evening before procedure, drink 1 bottle of Clenpiq, hydrate, drink (5) 8 oz of water. Then do the same thing 5 hours prior to your procedure. (Patient not taking: Reported on 10/16/2021) 320 mL 0   No current facility-administered medications for this visit.    PHYSICAL EXAMINATION: ECOG PERFORMANCE STATUS: 0 - Asymptomatic  Vitals:   10/16/21 1407  BP: 116/75  Pulse: 97  Temp: (!) 96.6  F (35.9 C)  SpO2: 96%   Filed Weights   10/16/21 1407  Weight: 125 lb 3.2 oz (56.8 kg)   Thin built Caucasian male patient.  Positive for abdominal distention.  Physical Exam HENT:     Head: Normocephalic and atraumatic.     Mouth/Throat:     Pharynx: No oropharyngeal exudate.  Eyes:     Pupils: Pupils are equal, round, and reactive to light.  Cardiovascular:     Rate and Rhythm: Normal rate and regular rhythm.  Pulmonary:     Effort: No respiratory distress.     Breath sounds: No wheezing.     Comments: Decreased air entry bilaterally. Abdominal:     General: Bowel sounds are normal. There is no distension.     Palpations: Abdomen is  soft. There is no mass.     Tenderness: There is no abdominal tenderness. There is no guarding or rebound.  Musculoskeletal:        General: No tenderness. Normal range of motion.     Cervical back: Normal range of motion and neck supple.  Skin:    General: Skin is warm.  Neurological:     Mental Status: He is alert and oriented to person, place, and time.  Psychiatric:        Mood and Affect: Affect normal.     LABORATORY DATA:  I have reviewed the data as listed Lab Results  Component Value Date   WBC 6.4 10/03/2021   HGB 12.7 (L) 10/03/2021   HCT 38.0 (L) 10/03/2021   MCV 88.6 10/03/2021   PLT 411 (H) 10/03/2021   Recent Labs    02/08/21 1628 05/11/21 1615 09/19/21 1338 10/03/21 1034  NA 136 134*  --  134*  K 4.6 4.6  --  4.4  CL 99 100  --  93*  CO2 28 27  --  30  GLUCOSE 85 90  --  146*  BUN 15 12  --  20  CREATININE 1.03 0.90 0.80 0.84  CALCIUM 9.4 9.4  --  9.3  GFRNONAA 74  --   --  >60  GFRAA 86  --   --   --   PROT 7.2 6.8  --  8.0  ALBUMIN  --   --   --  3.7  AST 18 21  --  28  ALT 17 20  --  19  ALKPHOS  --   --   --  78  BILITOT 0.4 0.7  --  0.4    RADIOGRAPHIC STUDIES: I have personally reviewed the radiological images as listed and agreed with the findings in the report. CT Abdomen Pelvis W  Contrast  Result Date: 09/20/2021 CLINICAL DATA:  History of rectal cancer. Abnormal chest CT follow-up. EXAM: CT ABDOMEN AND PELVIS WITH CONTRAST TECHNIQUE: Multidetector CT imaging of the abdomen and pelvis was performed using the standard protocol following bolus administration of intravenous contrast. CONTRAST:  52mL OMNIPAQUE IOHEXOL 300 MG/ML  SOLN COMPARISON:  CT abdomen and pelvis 04/02/2019 FINDINGS: Lower chest: Emphysematous changes of the lungs. Mild bibasilar scarring/subsegmental atelectasis. Hepatobiliary: Liver is normal in size with new scalloping/nodularity of the contour since previous study. No focal hepatic mass identified. Gallbladder appears within normal limits. No biliary ductal dilatation appreciated. Pancreas: Unremarkable. No pancreatic ductal dilatation or surrounding inflammatory changes. Spleen: Normal size with no suspicious mass. Mild scalloping of the contour. Adrenals/Urinary Tract: Adrenal glands appear normal. Symmetric perfusion of the kidneys. No suspicious renal mass or hydronephrosis identified bilaterally. Urinary bladder is incompletely distended with no obvious mass visualized. Stomach/Bowel: No bowel obstruction, free air or pneumatosis. Colonic diverticulosis without evidence of acute diverticulitis. Mild wall thickening throughout the sigmoid colon. Ill-defined hypodense masslike density surrounding the lower rectum near the rectal anal junction. Vascular/Lymphatic: Severe atherosclerotic disease. No definite bulky lymphadenopathy identified, limited evaluation. Reproductive: Prostate gland appears to be normal size. Other: Large volume ascites which measures 25-30 Hounsfield unit density. Diffuse peritoneal irregular thickening and mild enhancement consistent with carcinomatosis. Extensive omental caking with mild enhancement. Musculoskeletal: Postsurgical and degenerative changes of the lumbar spine. No suspicious bony lesions identified. IMPRESSION: 1. Findings  consistent with metastatic peritoneal carcinomatosis as described including diffuse peritoneal thickening and nodularity, omental caking, and large volume ascites with scalloping of visceral surfaces. 2. Amorphous ill-defined hypodense  masslike density at the lower rectum which may represent the known rectal cancer, limited visualization. 3. Other ancillary findings as described. Electronically Signed   By: Ofilia Neas M.D.   On: 09/20/2021 10:41   NM Myocar Multi W/Spect W/Wall Motion / EF  Result Date: 09/22/2021 Pharmacological myocardial perfusion imaging study with no significant  ischemia Normal wall motion, EF estimated at 58% GI uptake artifact noted No EKG changes concerning for ischemia at peak stress or in recovery. CT attenuation correction images with mild aortic atherosclerosis, unable to exclude mild coronary calcification and ostial RCA Low risk scan Signed, Esmond Plants, MD, Ph.D [facility]   NM PET Image Initial (PI) Skull Base To Thigh (F-18 FDG)  Result Date: 10/13/2021 CLINICAL DATA:  Initial treatment strategy for rectal cancer. Peritoneal carcinomatosis. EXAM: NUCLEAR MEDICINE PET SKULL BASE TO THIGH TECHNIQUE: 6.8 mCi F-18 FDG was injected intravenously. Full-ring PET imaging was performed from the skull base to thigh after the radiotracer. CT data was obtained and used for attenuation correction and anatomic localization. Fasting blood glucose: 104 mg/dl COMPARISON:  CT scan 09/19/2021 FINDINGS: Mediastinal blood pool activity: SUV max 1.95 Liver activity: SUV max NA NECK: Large area hypermetabolism associated with the right aspect of the tongue worrisome for neoplasm. SUV max is 9.72. Difficult to see a discrete mass on the CT scan. Recommend correlation with direct visualization. No associated enlarged or hypermetabolic neck nodes. Incidental CT findings: Bilateral carotid artery calcifications. CHEST: No hypermetabolic mediastinal or hilar nodes. No suspicious pulmonary  nodules on the CT scan. Incidental CT findings: Advanced emphysematous changes and areas of pulmonary scarring. Scattered atherosclerotic calcifications involving the aorta and coronary arteries. Small bilateral pleural effusions. ABDOMEN/PELVIS: Diffuse and extensive peritoneal carcinomatosis with extensive soft tissue nodularity throughout the abdomen and pelvis which demonstrates hypermetabolism. SUV max ranges between 3.38 and 4.20. No discrete hypermetabolic rectal mass is identified. No other colonic lesions are identified. No findings to suggest hepatic metastatic disease. Incidental CT findings: Age advanced atherosclerotic calcifications involving the aorta and iliac arteries but no aneurysm. SKELETON: No PET-CT findings to suggest skeletal metastasis. Incidental CT findings: Surgical changes involving the lumbar spine with lumbar fusion hardware. IMPRESSION: 1. Diffuse peritoneal carcinomatosis and associated large volume abdominal/pelvic ascites. 2. No discrete hypermetabolic rectal or colonic mass is identified. 3. No findings for hepatic metastatic disease or metastatic disease involving the chest or bony structures. 4. Large area of hypermetabolism involving the right aspect of the tongue without discrete mass seen on CT. Findings worrisome for a tongue cancer. Recommend correlation with direct visualization. No associated hypermetabolic neck adenopathy. 5. Emphysematous changes and pulmonary scarring. 6. Age advanced vascular disease. Electronically Signed   By: Marijo Sanes M.D.   On: 10/13/2021 08:42   US Paracentesis  Result Date: 10/04/2021 INDICATION: Rectal mass with peritoneal carcinomatosis and ascites request received for diagnostic and therapeutic paracentesis. EXAM: ULTRASOUND GUIDED  PARACENTESIS MEDICATIONS: Local 1% lidocaine only. COMPLICATIONS: None immediate. PROCEDURE: Informed written consent was obtained from the patient after a discussion of the risks, benefits and  alternatives to treatment. A timeout was performed prior to the initiation of the procedure. Initial ultrasound scanning demonstrates a small amount of ascites within the right lower abdominal quadrant. The right lower abdomen was prepped and draped in the usual sterile fashion. 1% lidocaine was used for local anesthesia. Following this, a 19 gauge, 7-cm, Yueh catheter was introduced. An ultrasound image was saved for documentation purposes. The paracentesis was performed. The catheter was removed and a  dressing was applied. The patient tolerated the procedure well without immediate post procedural complication. FINDINGS: A total of approximately 2 L of amber colored fluid was removed. Samples were sent to the laboratory as requested by the clinical team. IMPRESSION: Successful ultrasound-guided paracentesis yielding 2 liters of peritoneal fluid. This exam was performed by Tsosie Billing PA-C, and was supervised and interpreted by Dr. Annamaria Boots. Electronically Signed   By: Jerilynn Mages.  Shick M.D.   On: 10/04/2021 13:48   ECHOCARDIOGRAM COMPLETE  Result Date: 09/21/2021    ECHOCARDIOGRAM REPORT   Patient Name:   SABEN DONIGAN Date of Exam: 09/21/2021 Medical Rec #:  [id number]    Height:       70.5 in Accession #:    [id number]   Weight:       124.0 lb Date of Birth:  06-20-1951     BSA:          1.713 m Patient Age:    37 years     BP:           110/72 mmHg Patient Gender: M            HR:           80 bpm. Exam Location:  [facility] Procedure: 2D Echo, Cardiac Doppler and Color Doppler Indications:    I25.10 Coronary artery disease involving native coronary artery                 of native heart, unspecified whether angina present  History:        Patient has no prior history of Echocardiogram examinations.                 CAD; Risk Factors:Current Smoker.  Sonographer:    Caesar Chestnut RDCS, RVT Referring Phys: [id number] Kate Sable  Sonographer Comments: Technically difficult study due to poor echo windows. IMPRESSIONS   1. Left ventricular ejection fraction, by estimation, is 55%. The left ventricle has normal function. The left ventricle has no regional wall motion abnormalities. Left ventricular diastolic parameters are consistent with Grade I diastolic dysfunction (impaired relaxation).  2. Right ventricular systolic function is normal. The right ventricular size is normal. There is normal pulmonary artery systolic pressure. The estimated right ventricular systolic pressure is 12.8 mmHg.  3. The mitral valve is normal in structure. Mild mitral valve regurgitation. No evidence of mitral stenosis.  4. Tricuspid valve regurgitation is mild to moderate.  5. The aortic valve was not well visualized. Aortic valve regurgitation is not visualized. No aortic stenosis is present.  6. The inferior vena cava is normal in size with greater than 50% respiratory variability, suggesting right atrial pressure of 3 mmHg. FINDINGS  Left Ventricle: Left ventricular ejection fraction, by estimation, is 55%. The left ventricle has normal function. The left ventricle has no regional wall motion abnormalities. The left ventricular internal cavity size was normal in size. There is no left ventricular hypertrophy. Left ventricular diastolic parameters are consistent with Grade I diastolic dysfunction (impaired relaxation). Right Ventricle: The right ventricular size is normal. No increase in right ventricular wall thickness. Right ventricular systolic function is normal. There is normal pulmonary artery systolic pressure. The tricuspid regurgitant velocity is 2.68 m/s, and  with an assumed right atrial pressure of 5 mmHg, the estimated right ventricular systolic pressure is 78.6 mmHg. Left Atrium: Left atrial size was normal in size. Right Atrium: Right atrial size was normal in size. Pericardium: There is no evidence of pericardial effusion. Mitral Valve: The mitral  valve is normal in structure. Mild mitral valve regurgitation. No evidence of mitral  valve stenosis. Tricuspid Valve: The tricuspid valve is normal in structure. Tricuspid valve regurgitation is mild to moderate. No evidence of tricuspid stenosis. Aortic Valve: The aortic valve was not well visualized. Aortic valve regurgitation is not visualized. No aortic stenosis is present. Aortic valve mean gradient measures 2.0 mmHg. Aortic valve peak gradient measures 3.1 mmHg. Aortic valve area, by VTI measures 4.43 cm. Pulmonic Valve: The pulmonic valve was normal in structure. Pulmonic valve regurgitation is not visualized. No evidence of pulmonic stenosis. Aorta: The aortic root is normal in size and structure. Venous: The inferior vena cava is normal in size with greater than 50% respiratory variability, suggesting right atrial pressure of 3 mmHg. IAS/Shunts: No atrial level shunt detected by color flow Doppler.  LEFT VENTRICLE PLAX 2D LVIDd:         4.70 cm     Diastology LVIDs:         3.30 cm     LV e' medial:    9.03 cm/s LV PW:         0.80 cm     LV E/e' medial:  5.8 LV IVS:        0.60 cm     LV e' lateral:   10.30 cm/s LVOT diam:     2.20 cm     LV E/e' lateral: 5.1 LV SV:         78 LV SV Index:   46 LVOT Area:     3.80 cm  LV Volumes (MOD) LV vol d, MOD A2C: 62.9 ml LV vol d, MOD A4C: 97.0 ml LV vol s, MOD A2C: 19.9 ml LV vol s, MOD A4C: 27.3 ml LV SV MOD A2C:     43.0 ml LV SV MOD A4C:     97.0 ml LV SV MOD BP:      57.6 ml RIGHT VENTRICLE RV Basal diam:  3.80 cm RV Mid diam:    2.30 cm RV S prime:     19.30 cm/s TAPSE (M-mode): 3.6 cm LEFT ATRIUM         Index       RIGHT ATRIUM           Index LA diam:    2.90 cm 1.69 cm/m  RA Area:     13.40 cm                                 RA Volume:   29.90 ml  17.46 ml/m  AORTIC VALVE                    PULMONIC VALVE AV Area (Vmax):    3.51 cm     PV Vmax:       0.85 m/s AV Area (Vmean):   3.51 cm     PV Peak grad:  2.9 mmHg AV Area (VTI):     4.43 cm AV Vmax:           88.10 cm/s AV Vmean:          60.400 cm/s AV VTI:            0.176 m AV  Peak Grad:      3.1 mmHg AV Mean Grad:      2.0 mmHg LVOT Vmax:         81.30 cm/s LVOT Vmean:  55.700 cm/s LVOT VTI:          0.205 m LVOT/AV VTI ratio: 1.16  AORTA Ao Root diam: 3.00 cm Ao Asc diam:  3.00 cm MITRAL VALVE               TRICUSPID VALVE MV Area (PHT): 2.61 cm    TR Peak grad:   28.7 mmHg MV Decel Time: 291 msec    TR Vmax:        268.00 cm/s MV E velocity: 52.80 cm/s MV A velocity: 69.20 cm/s  SHUNTS MV E/A ratio:  0.76        Systemic VTI:  0.20 m                            Systemic Diam: 2.20 cm Ida Rogue MD Electronically signed by Ida Rogue MD Signature Date/Time: 09/21/2021/6:57:08 PM    Final     ASSESSMENT & PLAN:   Peritoneal carcinomatosis (Echo) #Omental caking/peritoneal thickening/ascites-paracentesis/cytology adenocarcinoma.  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 2. No discrete hypermetabolic rectal or colonic mass is identified.  No evidence of any hepatic or distant metastatic disease.-Except right tongue uptake/ see below.  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  Understands treatments are palliative not curative.  #Discussed that many life expectancy in the order of 1 to 2 years with treatment; and without treatment life expectancy of less than 6 months.  Patient would be hospice candidate if he chooses not to do any treatments. I discussed that FOLFOX chemotherapy is given every 2 weeks; discuss the potential side effects including but not limited to nausea vomiting diarrhea, sores in the mouth, hand-foot syndrome; also tingling and numbness/cold sensitivity with oxaliplatin.  Also discussed regarding additional biopsy for NGS; and port placement etc. after extensive discussion patient states that he needs time to decide on treatments.  They will give me a call regarding a plan in the next few days.  #Right  lower chest wall pain/upper quadrant pain-likely secondary to malignancy.  Patient currently following up with pain clinic[Dr.Marks, [place]; [facility]] for chronic pain [on MS Contin; Percocet].  Recommend reaching out to pain physician with regards to worsening pain/I will be happy to talk to pain physician-coordinate care locally [if patient chooses treatment]; also hospice if patient declines treatment.  #  Large area of hypermetabolism involving the right aspect of the tongue without discrete mass seen on CT-clinically no mass seen.  Discussed regarding ENT evaluation.  See above decision.    # COPD-chronic STABLE  #Incidental findings on PET Imaging dated: JAN 13th, 2023-advance emphysema/atherosclerosis: I reviewed/discussed/counseled the patient.   # DISPOSITION: [facility]- [place]- [phone]/cell; H- [phone] # Follow up TBD-Dr.B  # I reviewed the blood work- with the patient in detail; also reviewed the imaging independently [as summarized above]; and with the patient in detail.    All questions were answered. The patient knows to call the clinic with any problems, questions or concerns.    Cammie Sickle, MD 10/16/2021 3:51 PM

## 2021-10-20 ENCOUNTER — Telehealth: Payer: Self-pay | Admitting: *Deleted

## 2021-10-20 NOTE — Telephone Encounter (Signed)
Patient called stating that he needs to get procedures for treatment started and that he also needs to complete a POA. Please contact patient with appointments. [phone]

## 2021-10-23 ENCOUNTER — Telehealth: Payer: Self-pay | Admitting: Internal Medicine

## 2021-10-23 ENCOUNTER — Other Ambulatory Visit: Payer: Self-pay | Admitting: Internal Medicine

## 2021-10-23 DIAGNOSIS — C786 Secondary malignant neoplasm of retroperitoneum and peritoneum: Secondary | ICD-10-CM

## 2021-10-23 NOTE — Telephone Encounter (Signed)
Schedule request for biopsy and port placement faxed to specialty scheduling.

## 2021-10-23 NOTE — Progress Notes (Signed)
DISCONTINUE ON PATHWAY REGIMEN - Other     START OFF PATHWAY REGIMEN - Other   [id number] (Leucovorin IV D1 + Fluorouracil IV D1/CIV D1,2 + Oxaliplatin IV D1) q14 Days:   A cycle is every 14 days:     Oxaliplatin      Leucovorin      Fluorouracil      Fluorouracil   **Always confirm dose/schedule in your pharmacy ordering system**  Patient Characteristics: Intent of Therapy: Non-Curative / Palliative Intent, Discussed with Patient

## 2021-10-23 NOTE — Telephone Encounter (Signed)
I spoke to patient's pain doctor-regarding his ongoing narcotic pain prescriptions.  For now patient will continue with his pain physician; and if needed can take over his pain management.  On spoke to patient sister-in-law; and also received a message from the patient is interested in proceeding with treatment.    Refer to IR re: CT-guided omental biopsy/ port placement [this week if possible]  Chemo education- FOLFOX ASAP  # follow up on FEB 1st, 2023- MD; labs- cbc/cmp;CEA; FOLFOX: pump off on feb 3rd-Dr.B

## 2021-10-23 NOTE — Progress Notes (Signed)
Patient on plan of care prior to pathways. 

## 2021-10-23 NOTE — Progress Notes (Signed)
Pharmacist Chemotherapy Monitoring - Initial Assessment    Anticipated start date: 11/01/21   The following has been reviewed per standard work regarding the patient's treatment regimen: The patient's diagnosis, treatment plan and drug doses, and organ/hematologic function Lab orders and baseline tests specific to treatment regimen  The treatment plan start date, drug sequencing, and pre-medications Prior authorization status  Patient's documented medication list, including drug-drug interaction screen and prescriptions for anti-emetics and supportive care specific to the treatment regimen The drug concentrations, fluid compatibility, administration routes, and timing of the medications to be used The patient's access for treatment and lifetime cumulative dose history, if applicable  The patient's medication allergies and previous infusion related reactions, if applicable   Changes made to treatment plan:  treatment plan date  Follow up needed:  Pending authorization for treatment  and signing treatment plan   Adelina Mings, [place], 10/23/2021  2:13 PM

## 2021-10-24 ENCOUNTER — Ambulatory Visit (INDEPENDENT_AMBULATORY_CARE_PROVIDER_SITE_OTHER): Payer: Medicare PPO | Admitting: Unknown Physician Specialty

## 2021-10-24 ENCOUNTER — Ambulatory Visit: Payer: Self-pay | Admitting: *Deleted

## 2021-10-24 ENCOUNTER — Encounter: Payer: Self-pay | Admitting: Internal Medicine

## 2021-10-24 VITALS — BP 100/78 | HR 80 | Temp 97.7°F | Resp 16 | Ht 70.0 in | Wt 120.4 lb

## 2021-10-24 DIAGNOSIS — R64 Cachexia: Secondary | ICD-10-CM | POA: Diagnosis not present

## 2021-10-24 LAB — COMPLETE METABOLIC PANEL WITH GFR
AG Ratio: 1.3 (calc) (ref 1.0–2.5)
ALT: 16 U/L (ref 9–46)
AST: 20 U/L (ref 10–35)
Albumin: 3.5 g/dL — ABNORMAL LOW (ref 3.6–5.1)
Alkaline phosphatase (APISO): 68 U/L (ref 35–144)
BUN/Creatinine Ratio: 35 (calc) — ABNORMAL HIGH (ref 6–22)
BUN: 27 mg/dL — ABNORMAL HIGH (ref 7–25)
CO2: 37 mmol/L — ABNORMAL HIGH (ref 20–32)
Calcium: 9 mg/dL (ref 8.6–10.3)
Chloride: 96 mmol/L — ABNORMAL LOW (ref 98–110)
Creat: 0.78 mg/dL (ref 0.70–1.35)
Globulin: 2.8 g/dL (calc) (ref 1.9–3.7)
Glucose, Bld: 118 mg/dL — ABNORMAL HIGH (ref 65–99)
Potassium: 4.5 mmol/L (ref 3.5–5.3)
Sodium: 138 mmol/L (ref 135–146)
Total Bilirubin: 0.4 mg/dL (ref 0.2–1.2)
Total Protein: 6.3 g/dL (ref 6.1–8.1)
eGFR: 97 mL/min/{1.73_m2} (ref >=60)

## 2021-10-24 LAB — CBC WITH DIFFERENTIAL/PLATELET
Absolute Monocytes: 886 cells/uL (ref 200–950)
Basophils Absolute: 26 cells/uL (ref 0–200)
Basophils Relative: 0.3 %
Eosinophils Absolute: 17 cells/uL (ref 15–500)
Eosinophils Relative: 0.2 %
HCT: 37.2 % — ABNORMAL LOW (ref 38.5–50.0)
Hemoglobin: 12 g/dL — ABNORMAL LOW (ref 13.2–17.1)
Lymphs Abs: 800 cells/uL — ABNORMAL LOW (ref 850–3900)
MCH: 28.5 pg (ref 27.0–33.0)
MCHC: 32.3 g/dL (ref 32.0–36.0)
MCV: 88.4 fL (ref 80.0–100.0)
MPV: 10.8 fL (ref 7.5–12.5)
Monocytes Relative: 10.3 %
Neutro Abs: 6871 cells/uL (ref 1500–7800)
Neutrophils Relative %: 79.9 %
Platelets: 372 10*3/uL (ref 140–400)
RBC: 4.21 10*6/uL (ref 4.20–5.80)
RDW: 12.5 % (ref 11.0–15.0)
Total Lymphocyte: 9.3 %
WBC: 8.6 10*3/uL (ref 3.8–10.8)

## 2021-10-24 MED ORDER — NYSTATIN 100000 UNIT/ML MT SUSP: 5.0000 mL | Freq: Four times a day (QID) | OROMUCOSAL | 0 refills | Status: DC

## 2021-10-24 NOTE — Telephone Encounter (Addendum)
Patient is scheduled for para/bx/port placement on 1/27 arrive at 9:00 for 10:00 appt time.  The IR nurse will be contacting patient to discuss specific instructions.    MyChart message sent informing him of these appointments.

## 2021-10-24 NOTE — Progress Notes (Signed)
BP 100/78    Pulse 80    Temp 97.7 F (36.5 C) (Oral)    Resp 16    Ht 5\' 10"  (1.778 m)    Wt 120 lb 6.4 oz (54.6 kg)    BMI 17.28 kg/m    Subjective:    Patient ID: Roberto Collins, male    DOB: [birth year], 71 y.o.   MRN: [id number]  HPI: Roberto Collins is a 71 y.o. male  Chief Complaint  Patient presents with   Extremity Weakness    Pt sister states has notice him more weak, dehydrated going on as well. Pt was told on his 1/16 oncology appt about further treatment and since then hes been more worried.   He is with his sister in law with health care power of attorney.  Pt was diagnosed officially with peritoneal carcinoma stage 4.  He is feeling weak, not eating, and lost 5 pounds since  Monday.  Trying to eat but not able to eat much.  Does not feel light-headed when he stands.  Did vomit with blood specks.  Electing for palliative care.    Relevant past medical, surgical, family and social history reviewed and updated as indicated. Interim medical history since our last visit reviewed. Allergies and medications reviewed and updated.  Review of Systems  Constitutional:  Positive for appetite change and fatigue. Negative for fever.  HENT: Negative.    Respiratory: Negative.    Cardiovascular: Negative.   Genitourinary: Negative.   Psychiatric/Behavioral: Negative.     Per HPI unless specifically indicated above     Objective:    BP 100/78    Pulse 80    Temp 97.7 F (36.5 C) (Oral)    Resp 16    Ht 5\' 10"  (1.778 m)    Wt 120 lb 6.4 oz (54.6 kg)    BMI 17.28 kg/m   Wt Readings from Last 3 Encounters:  10/24/21 120 lb 6.4 oz (54.6 kg)  10/16/21 125 lb 3.2 oz (56.8 kg)  10/03/21 127 lb (57.6 kg)    Physical Exam Constitutional:      General: He is awake. He is not in acute distress.    Appearance: Normal appearance. He is well-developed. He is cachectic. He is ill-appearing.  HENT:     Head: Normocephalic and atraumatic.  Eyes:     General: Lids are normal. No scleral  icterus.       Right eye: No discharge.        Left eye: No discharge.     Conjunctiva/sclera: Conjunctivae normal.  Neck:     Vascular: No carotid bruit or JVD.  Cardiovascular:     Rate and Rhythm: Normal rate and regular rhythm.     Heart sounds: Normal heart sounds.  Pulmonary:     Effort: Pulmonary effort is normal. No respiratory distress.     Breath sounds: Normal breath sounds.  Abdominal:     Palpations: There is no hepatomegaly or splenomegaly.  Musculoskeletal:        General: Normal range of motion.     Cervical back: Normal range of motion and neck supple.  Skin:    General: Skin is warm and dry.     Coloration: Skin is not pale.     Findings: No rash.  Neurological:     Mental Status: He is alert and oriented to person, place, and time.  Psychiatric:        Behavior: Behavior normal. Behavior is cooperative.  Thought Content: Thought content normal.        Judgment: Judgment normal.    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  Losing weight rapidly.  Continue Boost plus push high calorie nutrition.    Relevant Orders   CBC with Differential/Platelet   COMPLETE METABOLIC PANEL WITH GFR       Discussed with Dr. Diamantina Monks who agrees with plan of care.  If anemic and needs blood will mangage at ER or cancer center.    Follow up plan: Following with cancer center on Monday

## 2021-10-24 NOTE — Addendum Note (Signed)
Addended by: Vanice Sarah on: 10/24/2021 02:26 PM   Modules accepted: Orders

## 2021-10-24 NOTE — Telephone Encounter (Signed)
°  Chief Complaint: extreme weakness Symptoms: weakness-unable to drive Frequency: 4/38- getting worse after recent cancer diagnosis Pertinent Negatives: Patient denies   Disposition: [] ED /[] Urgent Care (no appt availability in office) / [x] Appointment(In office/virtual)/ []  [facility]/ [] Home Care/ [] Refused Recommended Disposition /[] [place] [facility]/ []  Follow-up with PCP Additional Notes: Patient's sister is calling- she is very concerned about patient weakness.

## 2021-10-24 NOTE — Telephone Encounter (Signed)
Radiologist recommends patient have a paracentesis prior to bx, order entered.

## 2021-10-24 NOTE — Telephone Encounter (Signed)
Reason for Disposition  [1] MODERATE weakness AND [2] from poor fluid intake AND [3] no improvement after 2 hours of rest and fluids  Answer Assessment - Initial Assessment Questions 1. DESCRIPTION: "Describe how you are feeling."     Weakness- using ensure boost, eating small amounts of food 2. SEVERITY: "How bad is it?"  "Can you stand and walk?"   - MILD - Feels weak or tired, but does not interfere with work, school or normal activities   - [place] to stand and walk; weakness interferes with work, school, or normal activities   - SEVERE - Unable to stand or walk     moderate 3. ONSET:  "When did the weakness begin?"     Started 1/16- gradually getting weaker 4. CAUSE: "What do you think is causing the weakness?"     Cancer diagnosis 5. MEDICINES: "Have you recently started a new medicine or had a change in the amount of a medicine?"     Still taking medications listed 6. OTHER SYMPTOMS: "Do you have any other symptoms?" (e.g., chest pain, fever, cough, SOB, vomiting, diarrhea, bleeding, other areas of pain)     No vomiting, unsure about diarrhea- feels nerves 7. PREGNANCY: "Is there any chance you are pregnant?" "When was your last menstrual period?"     na  Protocols used: Weakness (Generalized) and Fatigue-A-AH

## 2021-10-25 ENCOUNTER — Other Ambulatory Visit: Payer: Self-pay | Admitting: *Deleted

## 2021-10-25 ENCOUNTER — Other Ambulatory Visit: Payer: Self-pay | Admitting: Student

## 2021-10-25 DIAGNOSIS — C786 Secondary malignant neoplasm of retroperitoneum and peritoneum: Secondary | ICD-10-CM

## 2021-10-25 DIAGNOSIS — C20 Malignant neoplasm of rectum: Secondary | ICD-10-CM

## 2021-10-26 NOTE — H&P (Signed)
Chief Complaint: Patient was seen in consultation today for No chief complaint on file.   Referring Physician(s): Cammie Sickle  Supervising Physician: Aletta Edouard  Patient Status: [facility] - Out-pt  History of Present Illness: Roberto Collins is a 71 y.o. male with a medical history significant for COPD, ongoing tobacco use and rectal cancer initially diagnosed July 2020. A CT chest for lung cancer screening performed 07/19/21 revealed ascites and possible peritoneal carcinomatosis. Follow up imaging obtained.   CT Abdomen/Pelvis with contrast 09/19/21 IMPRESSION: 1. 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 2. Amorphous ill-defined hypodense masslike density at the lower rectum which may represent the known rectal cancer, limited visualization. 3. Other ancillary findings as described.  A PET Scan 10/12/21 showed similar findings as well as a large area of hypermetabolism involving the right aspect of the tongue concerning for tongue cancer. Patient is familiar to IR from a paracentesis 10/04/21 with cytology positive for metastatic adenocarcinoma.  Interventional Radiology has been asked to evaluate this patient for an image-guided omental mass biopsy, port-a-catheter placement and possible paracentesis. Imaging reviewed and procedure approved by Dr. Vernard Gambles.    Past Medical History:  Diagnosis Date   Anemia    Asthma    Back pain    Blood transfusion without reported diagnosis     Past Surgical History:  Procedure Laterality Date   BACK SURGERY  2000   COLONOSCOPY WITH PROPOFOL N/A 04/02/2019   Procedure: COLONOSCOPY WITH PROPOFOL;  Surgeon: Virgel Manifold, MD;  Location: [facility] ENDOSCOPY;  Service: Endoscopy;  Laterality: N/A;   COLONOSCOPY WITH PROPOFOL N/A 04/20/2020   Procedure: COLONOSCOPY  WITH PROPOFOL;  Surgeon: Virgel Manifold, MD;  Location: [facility] ENDOSCOPY;  Service: Endoscopy;  Laterality: N/A;   COLONOSCOPY WITH PROPOFOL N/A 07/28/2021   Procedure: COLONOSCOPY WITH PROPOFOL;  Surgeon: Virgel Manifold, MD;  Location: [facility] ENDOSCOPY;  Service: Endoscopy;  Laterality: N/A;   ESOPHAGOGASTRODUODENOSCOPY Left 03/31/2019   Procedure: ESOPHAGOGASTRODUODENOSCOPY (EGD);  Surgeon: Virgel Manifold, MD;  Location: [facility] ENDOSCOPY;  Service: Endoscopy;  Laterality: Left;   GIVENS CAPSULE STUDY N/A 04/02/2019   Procedure: GIVENS CAPSULE STUDY;  Surgeon: Virgel Manifold, MD;  Location: [facility] ENDOSCOPY;  Service: Endoscopy;  Laterality: N/A;   XI ROBOT ASSISTED TRANSANAL RESECTION N/A 06/25/2019   Procedure: XI ROBOT ASSISTED PARTIAL PROCTECTOMY OF RECTAL MASS USING TAMIS;  Surgeon: [name], MD;  Location: [facility]S;  Service: General;  Laterality: N/A;    Allergies: Bee venom and Penicillins  Medications: Prior to Admission medications   Medication Sig Start Date End Date Taking? Authorizing Provider  albuterol (VENTOLIN HFA) 108 (90 Base) MCG/ACT inhaler Inhale 2 puffs into the lungs every 6 (six) hours as needed for wheezing or shortness of breath.    [provider]  aspirin EC 81 MG tablet Take 81 mg by mouth daily. Swallow whole.    [provider]  chlorproMAZINE (THORAZINE) 100 MG tablet Take 300 mg by mouth at bedtime.    [provider]  Cholecalciferol (VITAMIN D) 50 MCG (2000 UT) CAPS Take 2,000 Units by mouth daily.    [provider]  cyclobenzaprine (FLEXERIL) 10 MG tablet Take 10 mg by mouth at bedtime.    [provider]  doxepin (SINEQUAN) 25 MG capsule Take 25 mg by mouth at bedtime.    [provider]  fluticasone-salmeterol (ADVAIR) 250-50 MCG/ACT AEPB Inhale 1  puff into the lungs in the morning and at bedtime. Patient not taking: Reported on 10/24/2021    [provider]  gabapentin  (NEURONTIN) 600 MG tablet Take 1,800 mg by mouth at bedtime.     [provider]  morphine (MS CONTIN) 60 MG 12 hr tablet Take 60 mg by mouth every 12 (twelve) hours.    [provider]  nystatin (MYCOSTATIN) 100000 UNIT/ML suspension Take 5 mLs (500,000 Units total) by mouth 4 (four) times daily. 10/24/21   Kathrine Haddock, NP  oxyCODONE-acetaminophen (PERCOCET/ROXICET) 5-325 MG tablet Take 1 tablet by mouth every 4 (four) hours as needed for severe pain. Patient not taking: Reported on 10/24/2021    [provider]  rOPINIRole (REQUIP) 1 MG tablet Take 1 mg by mouth at bedtime as needed. Patient not taking: Reported on 10/24/2021 07/04/21   [provider]  rosuvastatin (CRESTOR) 5 MG tablet Take 1 tablet (5 mg total) by mouth at bedtime. 02/14/21   [name], PA-C  Sod Picosulfate-Mag Ox-Cit Acd (CLENPIQ) 10-3.5-12 MG-GM -GM/160ML SOLN Take 1 kit by mouth as directed. At 5 PM evening before procedure, drink 1 bottle of Clenpiq, hydrate, drink (5) 8 oz of water. Then do the same thing 5 hours prior to your procedure. Patient not taking: Reported on 10/24/2021 06/07/21   Virgel Manifold, MD     Family History  Problem Relation Age of Onset   Dementia Mother    Liver cancer Father     Social History   Socioeconomic History   Marital status: Single    Spouse name: Not on file   Number of children: 0   Years of education: Not on file   Highest education level: Not on file  Occupational History   Occupation: disabled  Tobacco Use   Smoking status: Every Day    Packs/day: 0.50    Years: 40.00    Pack years: 20.00    Types: Cigarettes    Start date: 08/02/2009   Smokeless tobacco: Never   Tobacco comments:    [place] smoking cessation program info provided  Vaping Use   Vaping Use: Never used  Substance and Sexual Activity   Alcohol use: Not Currently   Drug use: Never   Sexual activity: Not Currently  Other Topics Concern   Not on file   Social History Narrative   Lives alone; used to roofing/ disabled; no children   Social Determinants of Health   Financial Resource Strain: Low Risk    Difficulty of Paying Living Expenses: Not hard at all  Food Insecurity: No Food Insecurity   Worried About Charity fundraiser in the Last Year: Never true   [place] in the Last Year: Never true  Transportation Needs: No Transportation Needs   Lack of Transportation (Medical): No   Lack of Transportation (Non-Medical): No  Physical Activity: Insufficiently Active   Days of Exercise per Week: 3 days   Minutes of Exercise per Session: 30 min  Stress: No Stress Concern Present   Feeling of Stress : Not at all  Social Connections: Socially Isolated   Frequency of Communication with Friends and Family: More than three times a week   Frequency of Social Gatherings with Friends and Family: Once a week   Attends Religious Services: Never   Marine scientist or Organizations: No   Attends Archivist Meetings: Never   Marital Status: Never married    Review of Systems: A 12  point ROS discussed and pertinent positives are indicated in the HPI above.  All other systems are negative.  Review of Systems  Constitutional:  Positive for appetite change and fatigue.  Cardiovascular:  Negative for chest pain and leg swelling.  Gastrointestinal:  Positive for diarrhea, nausea and vomiting. Negative for abdominal pain.  Neurological:  Negative for dizziness and headaches.   Vital Signs: BP 123/84    Pulse 99    Temp 97.9 F (36.6 C) (Oral)    Resp 20    Ht $R'5\' 10"'Cx$  (1.778 m)    Wt 120 lb 6.4 oz (54.6 kg)    SpO2 100%    BMI 17.28 kg/m   Physical Exam Constitutional:      General: He is in acute distress.     Appearance: He is cachectic. He is ill-appearing.  HENT:     Mouth/Throat:     Mouth: Mucous membranes are dry.     Pharynx: Oropharynx is clear.  Cardiovascular:     Rate and Rhythm: Normal rate and regular  rhythm.     Pulses: Normal pulses.     Heart sounds: Normal heart sounds.  Pulmonary:     Effort: Pulmonary effort is normal.     Breath sounds: Normal breath sounds.  Abdominal:     General: Bowel sounds are normal.     Palpations: Abdomen is soft.  Musculoskeletal:     Right lower leg: No edema.     Left lower leg: No edema.  Skin:    General: Skin is warm and dry.  Neurological:     Mental Status: He is alert and oriented to person, place, and time.    Imaging: NM PET Image Initial (PI) Skull Base To Thigh (F-18 FDG)  Result Date: 10/13/2021 CLINICAL DATA:  Initial treatment strategy for rectal cancer. Peritoneal carcinomatosis. EXAM: NUCLEAR MEDICINE PET SKULL BASE TO THIGH TECHNIQUE: 6.8 mCi F-18 FDG was injected intravenously. Full-ring PET imaging was performed from the skull base to thigh after the radiotracer. CT data was obtained and used for attenuation correction and anatomic localization. Fasting blood glucose: 104 mg/dl COMPARISON:  CT scan 09/19/2021 FINDINGS: Mediastinal blood pool activity: SUV max 1.95 Liver activity: SUV max NA NECK: Large area hypermetabolism associated with the right aspect of the tongue worrisome for neoplasm. SUV max is 9.72. Difficult to see a discrete mass on the CT scan. Recommend correlation with direct visualization. No associated enlarged or hypermetabolic neck nodes. Incidental CT findings: Bilateral carotid artery calcifications. CHEST: No hypermetabolic mediastinal or hilar nodes. No suspicious pulmonary nodules on the CT scan. Incidental CT findings: Advanced emphysematous changes and areas of pulmonary scarring. Scattered atherosclerotic calcifications involving the aorta and coronary arteries. Small bilateral pleural effusions. ABDOMEN/PELVIS: Diffuse and extensive peritoneal carcinomatosis with extensive soft tissue nodularity throughout the abdomen and pelvis which demonstrates hypermetabolism. SUV max ranges between 3.38 and 4.20. No  discrete hypermetabolic rectal mass is identified. No other colonic lesions are identified. No findings to suggest hepatic metastatic disease. Incidental CT findings: Age advanced atherosclerotic calcifications involving the aorta and iliac arteries but no aneurysm. SKELETON: No PET-CT findings to suggest skeletal metastasis. Incidental CT findings: Surgical changes involving the lumbar spine with lumbar fusion hardware. IMPRESSION: 1. Diffuse peritoneal carcinomatosis and associated large volume abdominal/pelvic ascites. 2. No discrete hypermetabolic rectal or colonic mass is identified. 3. No findings for hepatic metastatic disease or metastatic disease involving the chest or bony structures. 4. Large area of hypermetabolism involving the right aspect of the  tongue without discrete mass seen on CT. Findings worrisome for a tongue cancer. Recommend correlation with direct visualization. No associated hypermetabolic neck adenopathy. 5. Emphysematous changes and pulmonary scarring. 6. Age advanced vascular disease. Electronically Signed   By: Marijo Sanes M.D.   On: 10/13/2021 08:42   US Paracentesis  Result Date: 10/04/2021 INDICATION: Rectal mass with peritoneal carcinomatosis and ascites request received for diagnostic and therapeutic paracentesis. EXAM: ULTRASOUND GUIDED  PARACENTESIS MEDICATIONS: Local 1% lidocaine only. COMPLICATIONS: None immediate. PROCEDURE: Informed written consent was obtained from the patient after a discussion of the risks, benefits and alternatives to treatment. A timeout was performed prior to the initiation of the procedure. Initial ultrasound scanning demonstrates a small amount of ascites within the right lower abdominal quadrant. The right lower abdomen was prepped and draped in the usual sterile fashion. 1% lidocaine was used for local anesthesia. Following this, a 19 gauge, 7-cm, Yueh catheter was introduced. An ultrasound image was saved for documentation purposes. The  paracentesis was performed. The catheter was removed and a dressing was applied. The patient tolerated the procedure well without immediate post procedural complication. FINDINGS: A total of approximately 2 L of amber colored fluid was removed. Samples were sent to the laboratory as requested by the clinical team. IMPRESSION: Successful ultrasound-guided paracentesis yielding 2 liters of peritoneal fluid. This exam was performed by Tsosie Billing PA-C, and was supervised and interpreted by Dr. Annamaria Boots. Electronically Signed   By: Jerilynn Mages.  Shick M.D.   On: 10/04/2021 13:48    Labs:  CBC: Recent Labs    02/08/21 1628 10/03/21 1034 10/24/21 1427 10/27/21 0954  WBC 5.0 6.4 8.6 8.6  HGB 15.3 12.7* 12.0* 12.7*  HCT 45.5 38.0* 37.2* 38.8*  PLT 245 411* 372 365    COAGS: No results for input(s): INR, APTT in the last 8760 hours.  BMP: Recent Labs    02/08/21 1628 05/11/21 1615 09/19/21 1338 10/03/21 1034 10/24/21 1427  NA 136 134*  --  134* 138  K 4.6 4.6  --  4.4 4.5  CL 99 100  --  93* 96*  CO2 28 27  --  30 37*  GLUCOSE 85 90  --  146* 118*  BUN 15 12  --  20 27*  CALCIUM 9.4 9.4  --  9.3 9.0  CREATININE 1.03 0.90 0.80 0.84 0.78  GFRNONAA 74  --   --  >60  --   GFRAA 86  --   --   --   --     LIVER FUNCTION TESTS: Recent Labs    02/08/21 1628 05/11/21 1615 10/03/21 1034 10/24/21 1427  BILITOT 0.4 0.7 0.4 0.4  AST _0 ALT _1 ALKPHOS  --   --  78  --   PROT 7.2 6.8 8.0 6.3  ALBUMIN  --   --  3.7  --     TUMOR MARKERS: No results for input(s): AFPTM, CEA, CA199, CHROMGRNA in the last 8760 hours.  Assessment and Plan:  Peritoneal carcinomatosis; history of rectal cancer: Roberto Collins, 71 year old male, presents [facility] to the [facility] Interventional Radiology department for an image-guided omental mass biopsy, port-a-catheter placement and possible paracentesis.   Risks and benefits of this procedure were discussed with the patient  and/or patient's family including, but not limited to bleeding, infection, damage to adjacent structures or low yield requiring additional tests.  Risks and benefits of image-guided Port-a-catheter placement were discussed with the patient  including, but not limited to bleeding, infection, pneumothorax, or fibrin sheath development and need for additional procedures.  All of the questions were answered and there is agreement to proceed. He has been NPO. Labs and vitals have been reviewed.   Consent signed and in chart.  Thank you for this interesting consult.  I greatly enjoyed meeting Roberto Collins and look forward to participating in their care.  A copy of this report was sent to the requesting provider on this date.  Electronically Signed: Soyla Dryer, AGACNP-BC [phone] 10/27/2021, 10:08 AM   I spent a total of  30 Minutes   in face to face in clinical consultation, greater than 50% of which was counseling/coordinating care for omental mass biopsy; port-a-catheter placement.

## 2021-10-27 ENCOUNTER — Ambulatory Visit
Admission: RE | Admit: 2021-10-27 | Discharge: 2021-10-27 | Disposition: A | Discharge status: Home / Self Care | Payer: Medicare PPO | Source: Ambulatory Visit | Attending: Internal Medicine | Admitting: Internal Medicine

## 2021-10-27 ENCOUNTER — Other Ambulatory Visit: Payer: Self-pay

## 2021-10-27 DIAGNOSIS — C786 Secondary malignant neoplasm of retroperitoneum and peritoneum: Secondary | ICD-10-CM

## 2021-10-27 DIAGNOSIS — F17210 Nicotine dependence, cigarettes, uncomplicated: Secondary | ICD-10-CM | POA: Insufficient documentation

## 2021-10-27 DIAGNOSIS — J449 Chronic obstructive pulmonary disease, unspecified: Secondary | ICD-10-CM | POA: Insufficient documentation

## 2021-10-27 DIAGNOSIS — Z452 Encounter for adjustment and management of vascular access device: Secondary | ICD-10-CM | POA: Diagnosis not present

## 2021-10-27 DIAGNOSIS — Z85048 Personal history of other malignant neoplasm of rectum, rectosigmoid junction, and anus: Secondary | ICD-10-CM | POA: Diagnosis not present

## 2021-10-27 HISTORY — PX: IR IMAGING GUIDED PORT INSERTION: IMG5740

## 2021-10-27 LAB — CBC
HCT: 38.8 % — ABNORMAL LOW (ref 39.0–52.0)
Hemoglobin: 12.7 g/dL — ABNORMAL LOW (ref 13.0–17.0)
MCH: 28.9 pg (ref 26.0–34.0)
MCHC: 32.7 g/dL (ref 30.0–36.0)
MCV: 88.4 fL (ref 80.0–100.0)
Platelets: 365 10*3/uL (ref 150–400)
RBC: 4.39 MIL/uL (ref 4.22–5.81)
RDW: 13.8 % (ref 11.5–15.5)
WBC: 8.6 10*3/uL (ref 4.0–10.5)
nRBC: 0 % (ref 0.0–0.2)

## 2021-10-27 LAB — PROTIME-INR
INR: 1.1 (ref 0.8–1.2)
Prothrombin Time: 13.8 seconds (ref 11.4–15.2)

## 2021-10-27 MED ORDER — SODIUM CHLORIDE 0.9 % IV SOLN: INTRAVENOUS | Status: DC

## 2021-10-27 MED ORDER — MIDAZOLAM HCL 2 MG/2ML IJ SOLN
INTRAMUSCULAR | Status: AC
  Filled 2021-10-27: qty 2

## 2021-10-27 MED ORDER — FENTANYL CITRATE (PF) 100 MCG/2ML IJ SOLN
INTRAMUSCULAR | Status: AC
  Filled 2021-10-27: qty 2

## 2021-10-27 MED ORDER — HEPARIN SOD (PORK) LOCK FLUSH 100 UNIT/ML IV SOLN
INTRAVENOUS | Status: AC
  Administered 2021-10-27: 500 [IU]
  Filled 2021-10-27: qty 5

## 2021-10-27 MED ORDER — MIDAZOLAM HCL 2 MG/2ML IJ SOLN
INTRAMUSCULAR | Status: AC | PRN
  Administered 2021-10-27 (×2): 1 mg via INTRAVENOUS

## 2021-10-27 MED ORDER — LIDOCAINE HCL 1 % IJ SOLN
INTRAMUSCULAR | Status: AC
  Administered 2021-10-27: 12 mL
  Filled 2021-10-27: qty 20

## 2021-10-27 MED ORDER — FENTANYL CITRATE (PF) 100 MCG/2ML IJ SOLN
INTRAMUSCULAR | Status: AC | PRN
  Administered 2021-10-27 (×2): 50 ug via INTRAVENOUS

## 2021-10-27 NOTE — Procedures (Signed)
Interventional Radiology Procedure Note  Procedure: CT Guided Biopsy of omentum/peritoneal mass  Complications: None  Estimated Blood Loss: < 10 mL  Findings: 31 G core biopsy of left lateral omentum performed under CT guidance.  Four core samples obtained and sent to Pathology.  Venetia Night. Kathlene Cote, M.D Pager:  [phone]

## 2021-10-27 NOTE — Progress Notes (Signed)
Patient clinically stable post Omental biopsy/Port placement per Dr Kathlene Cote, tolerated well. Received Versed 2 mg along with Fentanyl 100 mcg IV for procedures. Report given to Fransico Michael Rn post procedures/recovery.

## 2021-10-27 NOTE — Procedures (Signed)
Interventional Radiology Procedure Note  Procedure: Single Lumen Power Port Placement    Access:  Right IJ vein.  Findings: Catheter tip positioned at SVC/RA junction. Port is ready for immediate use.   Complications: None  EBL: < 10 mL  Recommendations:  - Ok to shower in 24 hours - Do not submerge for 7 days - Routine line care   Jaionna Weisse T. Thora Scherman, M.D Pager:  [phone]   

## 2021-10-30 ENCOUNTER — Inpatient Hospital Stay: Payer: Medicare PPO

## 2021-10-30 ENCOUNTER — Other Ambulatory Visit: Payer: Self-pay

## 2021-10-30 DIAGNOSIS — C786 Secondary malignant neoplasm of retroperitoneum and peritoneum: Secondary | ICD-10-CM

## 2021-10-30 MED ORDER — DEXAMETHASONE 4 MG PO TABS: 8.0000 mg | ORAL_TABLET | Freq: Every day | ORAL | 1 refills | Status: AC

## 2021-10-30 MED ORDER — ONDANSETRON HCL 8 MG PO TABS: 8.0000 mg | ORAL_TABLET | Freq: Two times a day (BID) | ORAL | 1 refills | Status: AC | PRN

## 2021-10-30 MED ORDER — PROCHLORPERAZINE MALEATE 10 MG PO TABS: 10.0000 mg | ORAL_TABLET | Freq: Four times a day (QID) | ORAL | 1 refills | Status: AC | PRN

## 2021-10-30 MED ORDER — LIDOCAINE-PRILOCAINE 2.5-2.5 % EX CREA: TOPICAL_CREAM | CUTANEOUS | 3 refills | Status: AC

## 2021-10-31 ENCOUNTER — Other Ambulatory Visit: Payer: Self-pay | Admitting: Anatomic Pathology & Clinical Pathology

## 2021-10-31 ENCOUNTER — Telehealth: Payer: Self-pay | Admitting: Internal Medicine

## 2021-10-31 LAB — SURGICAL PATHOLOGY

## 2021-10-31 NOTE — Telephone Encounter (Signed)
Heather-please order [facility] on 2/2. Dx: colon cancer

## 2021-11-01 ENCOUNTER — Other Ambulatory Visit: Payer: Self-pay

## 2021-11-01 ENCOUNTER — Inpatient Hospital Stay: Payer: Medicare PPO | Attending: Internal Medicine

## 2021-11-01 ENCOUNTER — Inpatient Hospital Stay (HOSPITAL_BASED_OUTPATIENT_CLINIC_OR_DEPARTMENT_OTHER): Payer: Medicare PPO | Admitting: Internal Medicine

## 2021-11-01 ENCOUNTER — Inpatient Hospital Stay: Payer: Medicare PPO

## 2021-11-01 DIAGNOSIS — R180 Malignant ascites: Secondary | ICD-10-CM | POA: Diagnosis not present

## 2021-11-01 DIAGNOSIS — Z5111 Encounter for antineoplastic chemotherapy: Secondary | ICD-10-CM | POA: Diagnosis not present

## 2021-11-01 DIAGNOSIS — M549 Dorsalgia, unspecified: Secondary | ICD-10-CM | POA: Diagnosis not present

## 2021-11-01 DIAGNOSIS — R5383 Other fatigue: Secondary | ICD-10-CM | POA: Insufficient documentation

## 2021-11-01 DIAGNOSIS — F17210 Nicotine dependence, cigarettes, uncomplicated: Secondary | ICD-10-CM | POA: Diagnosis not present

## 2021-11-01 DIAGNOSIS — Z79899 Other long term (current) drug therapy: Secondary | ICD-10-CM | POA: Diagnosis not present

## 2021-11-01 DIAGNOSIS — Z800 Family history of malignant neoplasm of digestive organs: Secondary | ICD-10-CM | POA: Diagnosis not present

## 2021-11-01 DIAGNOSIS — C786 Secondary malignant neoplasm of retroperitoneum and peritoneum: Secondary | ICD-10-CM

## 2021-11-01 DIAGNOSIS — J449 Chronic obstructive pulmonary disease, unspecified: Secondary | ICD-10-CM | POA: Diagnosis not present

## 2021-11-01 DIAGNOSIS — C20 Malignant neoplasm of rectum: Secondary | ICD-10-CM

## 2021-11-01 DIAGNOSIS — Z818 Family history of other mental and behavioral disorders: Secondary | ICD-10-CM | POA: Insufficient documentation

## 2021-11-01 LAB — CBC WITH DIFFERENTIAL/PLATELET
Abs Immature Granulocytes: 0.04 10*3/uL (ref 0.00–0.07)
Basophils Absolute: 0 10*3/uL (ref 0.0–0.1)
Basophils Relative: 0 %
Eosinophils Absolute: 0 10*3/uL (ref 0.0–0.5)
Eosinophils Relative: 0 %
HCT: 33.6 % — ABNORMAL LOW (ref 39.0–52.0)
Hemoglobin: 11.2 g/dL — ABNORMAL LOW (ref 13.0–17.0)
Immature Granulocytes: 0 %
Lymphocytes Relative: 7 %
Lymphs Abs: 0.6 10*3/uL — ABNORMAL LOW (ref 0.7–4.0)
MCH: 28.8 pg (ref 26.0–34.0)
MCHC: 33.3 g/dL (ref 30.0–36.0)
MCV: 86.4 fL (ref 80.0–100.0)
Monocytes Absolute: 0.8 10*3/uL (ref 0.1–1.0)
Monocytes Relative: 9 %
Neutro Abs: 7.5 10*3/uL (ref 1.7–7.7)
Neutrophils Relative %: 84 %
Platelets: 350 10*3/uL (ref 150–400)
RBC: 3.89 MIL/uL — ABNORMAL LOW (ref 4.22–5.81)
RDW: 13.9 % (ref 11.5–15.5)
WBC: 9 10*3/uL (ref 4.0–10.5)
nRBC: 0 % (ref 0.0–0.2)

## 2021-11-01 LAB — COMPREHENSIVE METABOLIC PANEL
ALT: 17 U/L (ref 0–44)
AST: 27 U/L (ref 15–41)
Albumin: 3 g/dL — ABNORMAL LOW (ref 3.5–5.0)
Alkaline Phosphatase: 60 U/L (ref 38–126)
Anion gap: 9 (ref 5–15)
BUN: 25 mg/dL — ABNORMAL HIGH (ref 8–23)
CO2: 27 mmol/L (ref 22–32)
Calcium: 8.5 mg/dL — ABNORMAL LOW (ref 8.9–10.3)
Chloride: 95 mmol/L — ABNORMAL LOW (ref 98–111)
Creatinine, Ser: 0.89 mg/dL (ref 0.61–1.24)
GFR, Estimated: >60 mL/min (ref >60)
Glucose, Bld: 133 mg/dL — ABNORMAL HIGH (ref 70–99)
Potassium: 3.7 mmol/L (ref 3.5–5.1)
Sodium: 131 mmol/L — ABNORMAL LOW (ref 135–145)
Total Bilirubin: 0.2 mg/dL — ABNORMAL LOW (ref 0.3–1.2)
Total Protein: 6.3 g/dL — ABNORMAL LOW (ref 6.5–8.1)

## 2021-11-01 MED ORDER — LEUCOVORIN CALCIUM INJECTION 350 MG
419.0000 mg/m2 | Freq: Once | INTRAVENOUS | Status: AC
  Administered 2021-11-01: 700 mg via INTRAVENOUS
  Filled 2021-11-01: qty 35

## 2021-11-01 MED ORDER — PALONOSETRON HCL INJECTION 0.25 MG/5ML
0.2500 mg | Freq: Once | INTRAVENOUS | Status: AC
  Administered 2021-11-01: 0.25 mg via INTRAVENOUS
  Filled 2021-11-01: qty 5

## 2021-11-01 MED ORDER — DEXTROSE 5 % IV SOLN
Freq: Once | INTRAVENOUS | Status: AC
  Filled 2021-11-01: qty 250

## 2021-11-01 MED ORDER — SODIUM CHLORIDE 0.9 % IV SOLN
10.0000 mg | Freq: Once | INTRAVENOUS | Status: AC
  Administered 2021-11-01: 10 mg via INTRAVENOUS
  Filled 2021-11-01: qty 10

## 2021-11-01 MED ORDER — OXALIPLATIN CHEMO INJECTION 100 MG/20ML
85.0000 mg/m2 | Freq: Once | INTRAVENOUS | Status: AC
  Administered 2021-11-01: 140 mg via INTRAVENOUS
  Filled 2021-11-01: qty 20

## 2021-11-01 MED ORDER — SODIUM CHLORIDE 0.9 % IV SOLN
2400.0000 mg/m2 | INTRAVENOUS | Status: DC
  Administered 2021-11-01: 4000 mg via INTRAVENOUS
  Filled 2021-11-01: qty 80

## 2021-11-01 NOTE — Patient Instructions (Signed)
#  Get Imodium-use as directed for diarrhea  #Please give Prilosec/over-the-counter 1 pill twice a day 1 hour before meals-for reflux; continue Tums as needed.

## 2021-11-01 NOTE — Telephone Encounter (Signed)
Submitted to [facility] testing online, faxed. Specimen [phone] collected on 10/27/2021. Additional forms faxed. Order # [id number].

## 2021-11-01 NOTE — Progress Notes (Signed)
[place] NOTE  Patient Care Team: [name], PA-C as PCP - General (Family Medicine) Kate Sable, MD as PCP - Cardiology (Cardiology) Virgel Manifold, MD (Inactive) as Consulting Physician (Gastroenterology) Luetta Nutting Sharyn Dross, MD as Referring Physician (Pain Medicine) Cammie Sickle, MD as Consulting Physician (Medical Oncology) Kate Sable, MD as Consulting Physician (Cardiology) [name], MD as Consulting Physician (Colon and Rectal Surgery)  CHIEF COMPLAINTS/PURPOSE OF CONSULTATION: RECTAL MASS   Oncology History Overview Note  # JULy 2020- RECTAL MASS-frond-like/villous non-obstructing large mass partially circumferential (involving one half of the lumen circumference; Dr.Tahiliani]; CT C/A/P- NEGATIVE METASTATIC DISEASE; Bx- FRAGMENTS OF TUBULOVILLOUS ADENOMA WITH FOCAL HIGH-GRADE DYSPLASIA; - INVASIVE CARCINOMA NOT IDENTIFIED.   # July 2020- Severe IDA- Hb 3.5 [s/p PRBC]  SURGICAL PATHOLOGY  CASE: [id number]  PATIENT: [facility]  Surgical Pathology Report      Specimen Submitted:  A. Colon polyp x8, transverse; cold snare  B. Colon polyp x2, sigmoid; cold snare  C. Rectum polyp; cbx   Clinical History: Colon cancer screening Z12.11.  History of colonic  polyps Z86.010.  Colon polyps       DIAGNOSIS:  A. COLON POLYPS X8, TRANSVERSE; COLD SNARE:  - MULTIPLE FRAGMENTS OF TUBULAR ADENOMAS.  - NEGATIVE FOR HIGH-GRADE DYSPLASIA OR MALIGNANCY.   B. COLON POLYPS X2, SIGMOID; COLD SNARE:  - MULTIPLE FRAGMENTS OF TUBULAR ADENOMAS.  - NEGATIVE FOR HIGH-GRADE DYSPLASIA AND MALIGNANCY.   C. RECTAL POLYP; COLD BIOPSY:  - SUPERFICIAL STRIPS OF TUBULOVILLOUS ADENOMA.  - DEFINITE HIGH-GRADE DYSPLASIA IS NOT IDENTIFIED.  - NEGATIVE FOR MALIGNANCY IN A LIMITED SAMPLING.   Comment:  The patient's prior history of villous adenoma with high-grade dysplasia  involving the rectum is noted.  Biopsy sections from  the rectal polyp  display superficial strips of villous tissue with adenomatous changes.  Definite high-grade dysplasia and malignancy are not identified in the  current specimen.  However, evaluation is limited by the superficial  nature of the sampling.  Clinical and endoscopic correlation is  recommended.   IMPRESSION: 1. 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 2. Amorphous ill-defined hypodense masslike density at the lower rectum which may represent the known rectal cancer, limited visualization. 3. Other ancillary findings as described.     Electronically Signed   By: Ofilia Neas M.D.   On: 09/20/2021 10:41  31st, JAN 2023-  A. OMENTUM, LEFT LATERAL; CT-GUIDED CORE NEEDLE BIOPSY:  - POSITIVE FOR MALIGNANCY.  - METASTATIC ADENOCARCINOMA WITH MUCINOUS FEATURES, COMPATIBLE WITH  COLORECTAL PRIMARY.  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  Accompanied by his sister-in-law.  Roberto Collins 71 y.o.  male patient with peritoneal carcinomatosis/malignant ascites is here to proceed with his palliative chemotherapy with FOLFOX.  In  the interim patient had a omental biopsy-positive for adenocarcinoma;  colorectal primary.  Patient continues to have abdominal distention; however is not worse but he needs to have a paracentesis.  Complains of abdominal bloating/reflux-like symptoms.  Denies any nausea vomiting.  Review of Systems  Constitutional:  Positive for malaise/fatigue and weight loss. Negative for chills, diaphoresis and fever.  HENT:  Negative for nosebleeds and sore throat.   Eyes:  Negative for double vision.  Respiratory:  Negative for cough, hemoptysis, sputum production, shortness of breath and wheezing.   Cardiovascular:  Negative for chest pain, palpitations, orthopnea and leg swelling.  Gastrointestinal:  Negative for abdominal pain, blood in stool, constipation, diarrhea, heartburn, melena, nausea and vomiting.  Genitourinary:  Negative for dysuria, frequency and urgency.  Musculoskeletal:  Positive for back pain. Negative for joint pain.  Skin: Negative.  Negative for itching and rash.  Neurological:  Negative for dizziness, tingling, focal weakness, weakness and headaches.  Endo/Heme/Allergies:  Does not bruise/bleed easily.  Psychiatric/Behavioral:  Negative for depression. The patient is not nervous/anxious and does not have insomnia.     MEDICAL HISTORY:  Past Medical History:  Diagnosis Date   Anemia    Asthma    Back pain    Blood transfusion without reported diagnosis     SURGICAL HISTORY: Past Surgical History:  Procedure Laterality Date   BACK SURGERY  2000   COLONOSCOPY WITH PROPOFOL N/A 04/02/2019   Procedure: COLONOSCOPY WITH PROPOFOL;  Surgeon: Virgel Manifold, MD;  Location: [facility] ENDOSCOPY;  Service: Endoscopy;  Laterality: N/A;   COLONOSCOPY WITH PROPOFOL N/A 04/20/2020   Procedure: COLONOSCOPY WITH PROPOFOL;  Surgeon: Virgel Manifold, MD;  Location: [facility] ENDOSCOPY;  Service: Endoscopy;  Laterality: N/A;   COLONOSCOPY WITH PROPOFOL N/A 07/28/2021   Procedure: COLONOSCOPY WITH PROPOFOL;  Surgeon: Virgel Manifold, MD;  Location: [facility]  ENDOSCOPY;  Service: Endoscopy;  Laterality: N/A;   ESOPHAGOGASTRODUODENOSCOPY Left 03/31/2019   Procedure: ESOPHAGOGASTRODUODENOSCOPY (EGD);  Surgeon: Virgel Manifold, MD;  Location: [facility] ENDOSCOPY;  Service: Endoscopy;  Laterality: Left;   GIVENS CAPSULE STUDY N/A 04/02/2019   Procedure: GIVENS CAPSULE STUDY;  Surgeon: Virgel Manifold, MD;  Location: [facility] ENDOSCOPY;  Service: Endoscopy;  Laterality: N/A;   IR IMAGING GUIDED PORT INSERTION  10/27/2021   XI ROBOT ASSISTED TRANSANAL RESECTION N/A 06/25/2019   Procedure: XI ROBOT ASSISTED PARTIAL PROCTECTOMY OF RECTAL MASS USING TAMIS;  Surgeon: [name], MD;  Location: [facility] ORS;  Service: General;  Laterality: N/A;    SOCIAL HISTORY: Social History   Socioeconomic History   Marital status: Single    Spouse name: Not on file   Number of children: 0   Years of education: Not on file   Highest education level: Not on file  Occupational History   Occupation: disabled  Tobacco Use   Smoking status: Every Day    Packs/day: 0.50    Years: 40.00    Pack years: 20.00    Types: Cigarettes    Start date: 08/02/2009   Smokeless tobacco: Never   Tobacco comments:    [place] smoking cessation program info provided  Vaping Use   Vaping Use: Never used  Substance and Sexual Activity   Alcohol use: Not Currently   Drug use: Never   Sexual activity: Not Currently  Other Topics Concern   Not on file  Social History Narrative   Lives alone; used to roofing/ disabled; no children   Social Determinants of Radio broadcast assistant Strain: Low  Risk    Difficulty of Paying Living Expenses: Not hard at all  Food Insecurity: No Food Insecurity   Worried About [place] in the Last Year: Never true   Ran Out of Food in the Last Year: Never true  Transportation Needs: No Transportation Needs   Lack of Transportation (Medical): No   Lack of Transportation (Non-Medical): No  Physical Activity: Insufficiently Active   Days  of Exercise per Week: 3 days   Minutes of Exercise per Session: 30 min  Stress: No Stress Concern Present   Feeling of Stress : Not at all  Social Connections: Socially Isolated   Frequency of Communication with Friends and Family: More than three times a week   Frequency of Social Gatherings with Friends and Family: Once a week   Attends Religious Services: Never   Marine scientist or Organizations: No   Attends Music therapist: Never   Marital Status: Never married  Human resources officer Violence: Not At Risk   Fear of Current or Ex-Partner: No   Emotionally Abused: No   Physically Abused: No   Sexually Abused: No    FAMILY HISTORY: Family History  Problem Relation Age of Onset   Dementia Mother    Liver cancer Father     ALLERGIES:  is allergic to bee venom and penicillins.  MEDICATIONS:  Current Outpatient Medications  Medication Sig Dispense Refill   albuterol (VENTOLIN HFA) 108 (90 Base) MCG/ACT inhaler Inhale 2 puffs into the lungs every 6 (six) hours as needed for wheezing or shortness of breath.     chlorproMAZINE (THORAZINE) 100 MG tablet Take 300 mg by mouth at bedtime.     Cholecalciferol (VITAMIN D) 50 MCG (2000 UT) CAPS Take 2,000 Units by mouth daily.     cyclobenzaprine (FLEXERIL) 10 MG tablet Take 10 mg by mouth at bedtime.     cyclobenzaprine (FLEXERIL) 10 MG tablet Take 10 mg by mouth 3 (three) times daily as needed for muscle spasms.     dexamethasone (DECADRON) 4 MG tablet Take 2 tablets (8 mg total) by mouth daily. Start the day after chemotherapy for 2 days. Take with food. 30 tablet 1   doxepin (SINEQUAN) 25 MG capsule Take 25 mg by mouth at bedtime.     gabapentin (NEURONTIN) 600 MG tablet Take 1,800 mg by mouth at bedtime.      lidocaine-prilocaine (EMLA) cream Apply to affected area once 30 g 3   morphine (MS CONTIN) 60 MG 12 hr tablet Take 60 mg by mouth every 12 (twelve) hours.     nystatin (MYCOSTATIN) 100000 UNIT/ML suspension Take  5 mLs (500,000 Units total) by mouth 4 (four) times daily. 60 mL 0   ondansetron (ZOFRAN) 8 MG tablet Take 1 tablet (8 mg total) by mouth 2 (two) times daily as needed for refractory nausea / vomiting. Start on day 3 after chemotherapy. 30 tablet 1   prochlorperazine (COMPAZINE) 10 MG tablet Take 1 tablet (10 mg total) by mouth every 6 (six) hours as needed (Nausea or vomiting). 30 tablet 1   rosuvastatin (CRESTOR) 5 MG tablet Take 1 tablet (5 mg total) by mouth at bedtime. 90 tablet 3   aspirin EC 81 MG tablet Take 81 mg by mouth daily. Swallow whole.     fluticasone-salmeterol (ADVAIR) 250-50 MCG/ACT AEPB Inhale 1 puff into the lungs in the morning and at bedtime. (Patient not taking: Reported on 10/24/2021)     oxyCODONE-acetaminophen (PERCOCET/ROXICET) 5-325 MG tablet Take  1 tablet by mouth every 4 (four) hours as needed for severe pain. (Patient not taking: Reported on 10/24/2021)     rOPINIRole (REQUIP) 1 MG tablet Take 1 mg by mouth at bedtime as needed. (Patient not taking: Reported on 11/01/2021)     Sod Picosulfate-Mag Ox-Cit Acd (CLENPIQ) 10-3.5-12 MG-GM -GM/160ML SOLN Take 1 kit by mouth as directed. At 5 PM evening before procedure, drink 1 bottle of Clenpiq, hydrate, drink (5) 8 oz of water. Then do the same thing 5 hours prior to your procedure. (Patient not taking: Reported on 10/24/2021) 320 mL 0   No current facility-administered medications for this visit.   Facility-Administered Medications Ordered in Other Visits  Medication Dose Route Frequency Provider Last Rate Last Admin   fluorouracil (ADRUCIL) 4,000 mg in sodium chloride 0.9 % 70 mL chemo infusion  2,400 mg/m2 (Treatment Plan Recorded) Intravenous 1 day or 1 dose Charlaine Dalton R, MD   4,000 mg at 11/01/21 1255    PHYSICAL EXAMINATION: ECOG PERFORMANCE STATUS: 0 - Asymptomatic  Vitals:   11/01/21 0842  BP: 116/73  Pulse: 93  Temp: (!) 97.1 F (36.2 C)  SpO2: 100%   Filed Weights   11/01/21 0842  Weight: 122 lb  3.2 oz (55.4 kg)   Thin built Caucasian male patient.  Positive for abdominal distention.  Physical Exam HENT:     Head: Normocephalic and atraumatic.     Mouth/Throat:     Pharynx: No oropharyngeal exudate.  Eyes:     Pupils: Pupils are equal, round, and reactive to light.  Cardiovascular:     Rate and Rhythm: Normal rate and regular rhythm.  Pulmonary:     Effort: No respiratory distress.     Breath sounds: No wheezing.     Comments: Decreased air entry bilaterally. Abdominal:     General: Bowel sounds are normal. There is no distension.     Palpations: Abdomen is soft. There is no mass.     Tenderness: There is no abdominal tenderness. There is no guarding or rebound.  Musculoskeletal:        General: No tenderness. Normal range of motion.     Cervical back: Normal range of motion and neck supple.  Skin:    General: Skin is warm.  Neurological:     Mental Status: He is alert and oriented to person, place, and time.  Psychiatric:        Mood and Affect: Affect normal.     LABORATORY DATA:  I have reviewed the data as listed Lab Results  Component Value Date   WBC 9.0 11/01/2021   HGB 11.2 (L) 11/01/2021   HCT 33.6 (L) 11/01/2021   MCV 86.4 11/01/2021   PLT 350 11/01/2021   Recent Labs    02/08/21 1628 05/11/21 1615 10/03/21 1034 10/24/21 1427 11/01/21 0806  NA 136   < > 134* 138 131*  K 4.6   < > 4.4 4.5 3.7  CL 99   < > 93* 96* 95*  CO2 28   < > 30 37* 27  GLUCOSE 85   < > 146* 118* 133*  BUN 15   < > 20 27* 25*  CREATININE 1.03   < > 0.84 0.78 0.89  CALCIUM 9.4   < > 9.3 9.0 8.5*  GFRNONAA 74  --  >60  --  >60  GFRAA 86  --   --   --   --   PROT 7.2   < > 8.0 6.3  6.3*  ALBUMIN  --   --  3.7  --  3.0*  AST 18   < > _0 ALT 17   < > _1 ALKPHOS  --   --  78  --  60  BILITOT 0.4   < > 0.4 0.4 0.2*   < > = values in this interval not displayed.    RADIOGRAPHIC STUDIES: I have personally reviewed the radiological images as listed and  agreed with the findings in the report. NM PET Image Initial (PI) Skull Base To Thigh (F-18 FDG)  Result Date: 10/13/2021 CLINICAL DATA:  Initial treatment strategy for rectal cancer. Peritoneal carcinomatosis. EXAM: NUCLEAR MEDICINE PET SKULL BASE TO THIGH TECHNIQUE: 6.8 mCi F-18 FDG was injected intravenously. Full-ring PET imaging was performed from the skull base to thigh after the radiotracer. CT data was obtained and used for attenuation correction and anatomic localization. Fasting blood glucose: 104 mg/dl COMPARISON:  CT scan 09/19/2021 FINDINGS: Mediastinal blood pool activity: SUV max 1.95 Liver activity: SUV max NA NECK: Large area hypermetabolism associated with the right aspect of the tongue worrisome for neoplasm. SUV max is 9.72. Difficult to see a discrete mass on the CT scan. Recommend correlation with direct visualization. No associated enlarged or hypermetabolic neck nodes. Incidental CT findings: Bilateral carotid artery calcifications. CHEST: No hypermetabolic mediastinal or hilar nodes. No suspicious pulmonary nodules on the CT scan. Incidental CT findings: Advanced emphysematous changes and areas of pulmonary scarring. Scattered atherosclerotic calcifications involving the aorta and coronary arteries. Small bilateral pleural effusions. ABDOMEN/PELVIS: Diffuse and extensive peritoneal carcinomatosis with extensive soft tissue nodularity throughout the abdomen and pelvis which demonstrates hypermetabolism. SUV max ranges between 3.38 and 4.20. No discrete hypermetabolic rectal mass is identified. No other colonic lesions are identified. No findings to suggest hepatic metastatic disease. Incidental CT findings: Age advanced atherosclerotic calcifications involving the aorta and iliac arteries but no aneurysm. SKELETON: No PET-CT findings to suggest skeletal metastasis. Incidental CT findings: Surgical changes involving the lumbar spine with lumbar fusion hardware. IMPRESSION: 1. Diffuse  peritoneal carcinomatosis and associated large volume abdominal/pelvic ascites. 2. No discrete hypermetabolic rectal or colonic mass is identified. 3. No findings for hepatic metastatic disease or metastatic disease involving the chest or bony structures. 4. Large area of hypermetabolism involving the right aspect of the tongue without discrete mass seen on CT. Findings worrisome for a tongue cancer. Recommend correlation with direct visualization. No associated hypermetabolic neck adenopathy. 5. Emphysematous changes and pulmonary scarring. 6. Age advanced vascular disease. Electronically Signed   By: Marijo Sanes M.D.   On: 10/13/2021 08:42   US Paracentesis  Result Date: 10/04/2021 INDICATION: Rectal mass with peritoneal carcinomatosis and ascites request received for diagnostic and therapeutic paracentesis. EXAM: ULTRASOUND GUIDED  PARACENTESIS MEDICATIONS: Local 1% lidocaine only. COMPLICATIONS: None immediate. PROCEDURE: Informed written consent was obtained from the patient after a discussion of the risks, benefits and alternatives to treatment. A timeout was performed prior to the initiation of the procedure. Initial ultrasound scanning demonstrates a small amount of ascites within the right lower abdominal quadrant. The right lower abdomen was prepped and draped in the usual sterile fashion. 1% lidocaine was used for local anesthesia. Following this, a 19 gauge, 7-cm, Yueh catheter was introduced. An ultrasound image was saved for documentation purposes. The paracentesis was performed. The catheter was removed and a dressing was applied. The patient tolerated the procedure well without immediate post procedural complication. FINDINGS: A total of approximately 2 L  of amber colored fluid was removed. Samples were sent to the laboratory as requested by the clinical team. IMPRESSION: Successful ultrasound-guided paracentesis yielding 2 liters of peritoneal fluid. This exam was performed by Tsosie Billing  PA-C, and was supervised and interpreted by Dr. Annamaria Boots. Electronically Signed   By: Jerilynn Mages.  Shick M.D.   On: 10/04/2021 13:48   CT BIOPSY  Result Date: 10/27/2021 CLINICAL DATA:  Peritoneal carcinomatosis and ascites of unknown primary with prior paracentesis demonstrating malignant cells in ascites. Material was not sufficient for additional molecular analysis and core biopsy has been requested for further tissue analysis. EXAM: CT GUIDED CORE BIOPSY OF PERITONEAL/OMENTAL TUMOR ANESTHESIA/SEDATION: Moderate (conscious) sedation was employed during this procedure. A total of Versed 1.0 mg and Fentanyl 50 mcg was administered intravenously by radiology nursing. Moderate Sedation Time: 12 minutes. The patient's level of consciousness and vital signs were monitored continuously by radiology nursing throughout the procedure under my direct supervision. PROCEDURE: The procedure risks, benefits, and alternatives were explained to the patient. Questions regarding the procedure were encouraged and answered. The patient understands and consents to the procedure. A time-out was performed prior to initiating the procedure. CT was performed through the abdomen and pelvis. The left lower abdominal wall was prepped with chlorhexidine in a sterile fashion, and a sterile drape was applied covering the operative field. A sterile gown and sterile gloves were used for the procedure. Local anesthesia was provided with 1% Lidocaine. Under CT guidance, a 17 gauge trocar needle was advanced from a left anterior approach into the left peritoneal cavity. After confirming needle tip position, coaxial 18 gauge core biopsy samples were obtained. Four separate core biopsy samples were obtained and submitted in formalin. COMPLICATIONS: None FINDINGS: A region of peritoneal tumor demonstrating increased metabolic activity by PET scan in the left lateral lower peritoneal cavity was targeted. Solid, mucinous appearing tumor was obtained. IMPRESSION:  CT-guided core biopsy performed peritoneal/omental tumor in the left lateral lower peritoneal cavity. Solid tissue was obtained. RADIATION DOSE REDUCTION: This exam was performed according to the departmental dose-optimization program which includes automated exposure control, adjustment of the mA and/or kV according to patient size and/or use of iterative reconstruction technique. Electronically Signed   By: Aletta Edouard M.D.   On: 10/27/2021 13:34   IR IMAGING GUIDED PORT INSERTION  Result Date: 10/27/2021 CLINICAL DATA:  Peritoneal carcinomatosis and need for porta cath for chemotherapy. EXAM: IMPLANTED PORT A CATH PLACEMENT WITH ULTRASOUND AND FLUOROSCOPIC GUIDANCE ANESTHESIA/SEDATION: Moderate (conscious) sedation was employed during this procedure. A total of Versed 1.0 mg and Fentanyl 50 mcg was administered intravenously by radiology nursing. Moderate Sedation Time: 23 minutes. The patient's level of consciousness and vital signs were monitored continuously by radiology nursing throughout the procedure under my direct supervision. FLUOROSCOPY TIME:  42 seconds.  3.3 mGy. PROCEDURE: The procedure, risks, benefits, and alternatives were explained to the patient. Questions regarding the procedure were encouraged and answered. The patient understands and consents to the procedure. A time-out was performed prior to initiating the procedure. Ultrasound was utilized to confirm patency of the right internal jugular vein. A permanent ultrasound image was recorded. The right neck and chest were prepped with chlorhexidine in a sterile fashion, and a sterile drape was applied covering the operative field. Maximum barrier sterile technique with sterile gowns and gloves were used for the procedure. Local anesthesia was provided with 1% lidocaine. After creating a small venotomy incision, a 21 gauge needle was advanced into the right internal jugular vein under  direct, real-time ultrasound guidance. Ultrasound image  documentation was performed. After securing guidewire access, an 8 Fr dilator was placed. A J-wire was kinked to measure appropriate catheter length. A subcutaneous port pocket was then created along the upper chest wall utilizing sharp and blunt dissection. Portable cautery was utilized. The pocket was irrigated with sterile saline. A single lumen power injectable port was chosen for placement. The 8 Fr catheter was tunneled from the port pocket site to the venotomy incision. The port was placed in the pocket. External catheter was trimmed to appropriate length based on guidewire measurement. At the venotomy, an 8 Fr peel-away sheath was placed over a guidewire. The catheter was then placed through the sheath and the sheath removed. Final catheter positioning was confirmed and documented with a fluoroscopic spot image. The port was accessed with a needle and aspirated and flushed with heparinized saline. The access needle was removed. The venotomy and port pocket incisions were closed with subcutaneous 3-0 Monocryl and subcuticular 4-0 Vicryl. Dermabond was applied to both incisions. COMPLICATIONS: COMPLICATIONS None FINDINGS: After catheter placement, the tip lies at the cavo-atrial junction. The catheter aspirates normally and is ready for immediate use. IMPRESSION: Placement of single lumen port a cath via right internal jugular vein. The catheter tip lies at the cavo-atrial junction. A power injectable port a cath was placed and is ready for immediate use. Electronically Signed   By: Aletta Edouard M.D.   On: 10/27/2021 13:35    ASSESSMENT & PLAN:   Peritoneal carcinomatosis ([place]) .    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[facility]) # Omental caking/peritoneal thickening/ascites-s/p biopsy adenocarcinoma of colorectal origin.  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  No discrete hypermetabolic rectal or colonic mass is identified.  Stage IV  cancer.  # I discussed that FOLFOX chemotherapy is given every 2 weeks; discuss the potential side effects including but not limited to nausea vomiting diarrhea, sores in the mouth, hand-foot syndrome; also tingling and numbness/cold sensitivity with oxaliplatin.  Patient family understands treatments are palliative not curative.  We will plan to add Avastin with next visit. Reviewed the rationale for using Avastin. Discussed the potential side effects including but not limited to elevated blood pressure ; renal side effects, wound healing problems.  # proceed with FOLFOX today. Labs today reviewed;  acceptable for treatment today.  Recommend getting Imodium-use as directed for diarrhea  #Malignant ascites/abdominal discomfort/reflux-recommend Prilosec/over-the-counter 1 pill twice a day 1 hour before meals-for reflux; continue Tums as needed.  If worse would recommend paracentesis.  #Right lower chest wall pain/upper quadrant pain-likely secondary to malignancy [[on MS Contin; Percocet; Dr.Marks, [place]; [facility]] .  Discussed with Dr. Pecola Lawless will continue with following up with his pain physician; and if patient's pain is uncontrollable/needs adjustments-we can take over patient's narcotic prescriptions.  #  Large area of hypermetabolism involving the right aspect of the tongue without discrete mass seen on CT-clinically no mass seen.  ENT referral   # COPD-chronic STABLE  # IV access: s/p mediport-functional-   SIL- Anneette- [phone]/cell; H- [phone]  # DISPOSITION: # referral to [facility] re: pallaitive care- in 2 weeks # referral to [facility]- re: abnormal PET- right side of tongue # referral to [facility] re: weight loss # 1 week- labs- cb/bmp; IVFs over 1 hour # FOLFOX chemo today; pump off d-3 # follow up in 2 weeks- MD; labs- cbc/cmp;cea;UA; FOLFOX +bev; pump off on D-3- Dr.B   All questions were answered.  The patient knows to call the clinic with any problems,  questions or concerns.    Cammie Sickle, MD 11/01/2021 4:49 PM

## 2021-11-01 NOTE — Progress Notes (Signed)
Pt would like to discuss how many times a day he should take the flexeril, and his nausea meds.  Does he need to stay on the oxycodone?  Does he need to keep requip on his med list if not taking?  Pt c/o heartburn, taking tums but only helps a little.  Can he get a referral to palliative care?

## 2021-11-01 NOTE — Patient Instructions (Signed)
[facility] [place]  Discharge Instructions: Thank you for choosing [facility] to provide your oncology and hematology care.  If you have a lab appointment with the [facility], please go directly to the [facility] and check in at the registration area.  Wear comfortable clothing and clothing appropriate for easy access to any Portacath or PICC line.   We strive to give you quality time with your provider. You may need to reschedule your appointment if you arrive late (15 or more minutes).  Arriving late affects you and other patients whose appointments are after yours.  Also, if you miss three or more appointments without notifying the office, you may be dismissed from the clinic at the providers discretion.      For prescription refill requests, have your pharmacy contact our office and allow 72 hours for refills to be completed.    Today you received the following chemotherapy and/or immunotherapy agents: Oxaliplatin, Leucovorin, Fluorouracil      To help prevent nausea and vomiting after your treatment, we encourage you to take your nausea medication as directed.  BELOW ARE SYMPTOMS THAT SHOULD BE REPORTED IMMEDIATELY: *FEVER GREATER THAN 100.4 F (38 C) OR HIGHER *CHILLS OR SWEATING *NAUSEA AND VOMITING THAT IS NOT CONTROLLED WITH YOUR NAUSEA MEDICATION *UNUSUAL SHORTNESS OF BREATH *UNUSUAL BRUISING OR BLEEDING *URINARY PROBLEMS (pain or burning when urinating, or frequent urination) *BOWEL PROBLEMS (unusual diarrhea, constipation, pain near the anus) TENDERNESS IN MOUTH AND THROAT WITH OR WITHOUT PRESENCE OF ULCERS (sore throat, sores in mouth, or a toothache) UNUSUAL RASH, SWELLING OR PAIN  UNUSUAL VAGINAL DISCHARGE OR ITCHING   Items with * indicate a potential emergency and should be followed up as soon as possible or go to the Emergency Department if any problems should occur.  Please show the CHEMOTHERAPY ALERT CARD or  IMMUNOTHERAPY ALERT CARD at check-in to the Emergency Department and triage nurse.  Should you have questions after your visit or need to cancel or reschedule your appointment, please contact [facility] [place] [facility] [place]  [phone] and follow the prompts.  Office hours are 8:00 a.m. to 4:30 p.m. Monday - Friday. Please note that voicemails left after 4:00 p.m. may not be returned until the following business day.  We are closed weekends and major holidays. You have access to a nurse at all times for urgent questions. Please call the main number to the clinic [phone] and follow the prompts.  For any non-urgent questions, you may also contact your provider using MyChart. We now offer e-Visits for anyone 69 and older to request care online for non-urgent symptoms. For details visit [email or web address].   Also download the MyChart app! Go to the app store, search "MyChart", open the app, select [facility], and log in with your MyChart username and password.  Due to Covid, a mask is required upon entering the hospital/clinic. If you do not have a mask, one will be given to you upon arrival. For doctor visits, patients may have 1 support person aged 69 or older with them. For treatment visits, patients cannot have anyone with them due to current Covid guidelines and our immunocompromised population. Fluorouracil, 5-FU injection What is this medication? FLUOROURACIL, 5-FU (flure oh YOOR a sil) is a chemotherapy drug. It slows the growth of cancer cells. This medicine is used to treat many types of cancer like breast cancer, colon or rectal cancer, pancreatic cancer, and stomach cancer. This medicine may be used for other purposes;  ask your health care provider or pharmacist if you have questions. COMMON BRAND NAME(S): Adrucil What should I tell my care team before I take this medication? They need to know if you have any of these conditions: blood disorders dihydropyrimidine  dehydrogenase (DPD) deficiency infection (especially a virus infection such as chickenpox, cold sores, or herpes) kidney disease liver disease malnourished, poor nutrition recent or ongoing radiation therapy an unusual or allergic reaction to fluorouracil, other chemotherapy, other medicines, foods, dyes, or preservatives pregnant or trying to get pregnant breast-feeding How should I use this medication? This drug is given as an infusion or injection into a vein. It is administered in a hospital or clinic by a specially trained health care professional. Talk to your pediatrician regarding the use of this medicine in children. Special care may be needed. Overdosage: If you think you have taken too much of this medicine contact a poison control center or emergency room at once. NOTE: This medicine is only for you. Do not share this medicine with others. What if I miss a dose? It is important not to miss your dose. Call your doctor or health care professional if you are unable to keep an appointment. What may interact with this medication? Do not take this medicine with any of the following medications: live virus vaccines This medicine may also interact with the following medications: medicines that treat or prevent blood clots like warfarin, enoxaparin, and dalteparin This list may not describe all possible interactions. Give your health care provider a list of all the medicines, herbs, non-prescription drugs, or dietary supplements you use. Also tell them if you smoke, drink alcohol, or use illegal drugs. Some items may interact with your medicine. What should I watch for while using this medication? Visit your doctor for checks on your progress. This drug may make you feel generally unwell. This is not uncommon, as chemotherapy can affect healthy cells as well as cancer cells. Report any side effects. Continue your course of treatment even though you feel ill unless your doctor tells you to  stop. In some cases, you may be given additional medicines to help with side effects. Follow all directions for their use. Call your doctor or health care professional for advice if you get a fever, chills or sore throat, or other symptoms of a cold or flu. Do not treat yourself. This drug decreases your body's ability to fight infections. Try to avoid being around people who are sick. This medicine may increase your risk to bruise or bleed. Call your doctor or health care professional if you notice any unusual bleeding. Be careful brushing and flossing your teeth or using a toothpick because you may get an infection or bleed more easily. If you have any dental work done, tell your dentist you are receiving this medicine. Avoid taking products that contain aspirin, acetaminophen, ibuprofen, naproxen, or ketoprofen unless instructed by your doctor. These medicines may hide a fever. Do not become pregnant while taking this medicine. Women should inform their doctor if they wish to become pregnant or think they might be pregnant. There is a potential for serious side effects to an unborn child. Talk to your health care professional or pharmacist for more information. Do not breast-feed an infant while taking this medicine. Men should inform their doctor if they wish to father a child. This medicine may lower sperm counts. Do not treat diarrhea with over the counter products. Contact your doctor if you have diarrhea that lasts more than 2  days or if it is severe and watery. This medicine can make you more sensitive to the sun. Keep out of the sun. If you cannot avoid being in the sun, wear protective clothing and use sunscreen. Do not use sun lamps or tanning beds/booths. What side effects may I notice from receiving this medication? Side effects that you should report to your doctor or health care professional as soon as possible: allergic reactions like skin rash, itching or hives, swelling of the face,  lips, or tongue low blood counts - this medicine may decrease the number of white blood cells, red blood cells and platelets. You may be at increased risk for infections and bleeding. signs of infection - fever or chills, cough, sore throat, pain or difficulty passing urine signs of decreased platelets or bleeding - bruising, pinpoint red spots on the skin, black, tarry stools, blood in the urine signs of decreased red blood cells - unusually weak or tired, fainting spells, lightheadedness breathing problems changes in vision chest pain mouth sores nausea and vomiting pain, swelling, redness at site where injected pain, tingling, numbness in the hands or feet redness, swelling, or sores on hands or feet stomach pain unusual bleeding Side effects that usually do not require medical attention (report to your doctor or health care professional if they continue or are bothersome): changes in finger or toe nails diarrhea dry or itchy skin hair loss headache loss of appetite sensitivity of eyes to the light stomach upset unusually teary eyes This list may not describe all possible side effects. Call your doctor for medical advice about side effects. You may report side effects to FDA at 1-800-FDA-1088. Where should I keep my medication? This drug is given in a hospital or clinic and will not be stored at home. NOTE: This sheet is a summary. It may not cover all possible information. If you have questions about this medicine, talk to your doctor, pharmacist, or health care provider.  2022 Elsevier/Gold Standard (2021-06-06 00:00:00) Leucovorin injection What is this medication? LEUCOVORIN (loo koe VOR in) is used to prevent or treat the harmful effects of some medicines. This medicine is used to treat anemia caused by a low amount of folic acid in the body. It is also used with 5-fluorouracil (5-FU) to treat colon cancer. This medicine may be used for other purposes; ask your health care  provider or pharmacist if you have questions. What should I tell my care team before I take this medication? They need to know if you have any of these conditions: anemia from low levels of vitamin B-12 in the blood an unusual or allergic reaction to leucovorin, folic acid, other medicines, foods, dyes, or preservatives pregnant or trying to get pregnant breast-feeding How should I use this medication? This medicine is for injection into a muscle or into a vein. It is given by a health care professional in a hospital or clinic setting. Talk to your pediatrician regarding the use of this medicine in children. Special care may be needed. Overdosage: If you think you have taken too much of this medicine contact a poison control center or emergency room at once. NOTE: This medicine is only for you. Do not share this medicine with others. What if I miss a dose? This does not apply. What may interact with this medication? capecitabine fluorouracil phenobarbital phenytoin primidone trimethoprim-sulfamethoxazole This list may not describe all possible interactions. Give your health care provider a list of all the medicines, herbs, non-prescription drugs, or dietary supplements  you use. Also tell them if you smoke, drink alcohol, or use illegal drugs. Some items may interact with your medicine. What should I watch for while using this medication? Your condition will be monitored carefully while you are receiving this medicine. This medicine may increase the side effects of 5-fluorouracil, 5-FU. Tell your doctor or health care professional if you have diarrhea or mouth sores that do not get better or that get worse. What side effects may I notice from receiving this medication? Side effects that you should report to your doctor or health care professional as soon as possible: allergic reactions like skin rash, itching or hives, swelling of the face, lips, or tongue breathing problems fever,  infection mouth sores unusual bleeding or bruising unusually weak or tired Side effects that usually do not require medical attention (report to your doctor or health care professional if they continue or are bothersome): constipation or diarrhea loss of appetite nausea, vomiting This list may not describe all possible side effects. Call your doctor for medical advice about side effects. You may report side effects to FDA at 1-800-FDA-1088. Where should I keep my medication? This drug is given in a hospital or clinic and will not be stored at home. NOTE: This sheet is a summary. It may not cover all possible information. If you have questions about this medicine, talk to your doctor, pharmacist, or health care provider.  2022 Elsevier/Gold Standard (2008-03-25 00:00:00) Oxaliplatin Injection What is this medication? OXALIPLATIN (ox AL i PLA tin) is a chemotherapy drug. It targets fast dividing cells, like cancer cells, and causes these cells to die. This medicine is used to treat cancers of the colon and rectum, and many other cancers. This medicine may be used for other purposes; ask your health care provider or pharmacist if you have questions. COMMON BRAND NAME(S): Eloxatin What should I tell my care team before I take this medication? They need to know if you have any of these conditions: heart disease history of irregular heartbeat liver disease low blood counts, like white cells, platelets, or red blood cells lung or breathing disease, like asthma take medicines that treat or prevent blood clots tingling of the fingers or toes, or other nerve disorder an unusual or allergic reaction to oxaliplatin, other chemotherapy, other medicines, foods, dyes, or preservatives pregnant or trying to get pregnant breast-feeding How should I use this medication? This drug is given as an infusion into a vein. It is administered in a hospital or clinic by a specially trained health care  professional. Talk to your pediatrician regarding the use of this medicine in children. Special care may be needed. Overdosage: If you think you have taken too much of this medicine contact a poison control center or emergency room at once. NOTE: This medicine is only for you. Do not share this medicine with others. What if I miss a dose? It is important not to miss a dose. Call your doctor or health care professional if you are unable to keep an appointment. What may interact with this medication? Do not take this medicine with any of the following medications: cisapride dronedarone pimozide thioridazine This medicine may also interact with the following medications: aspirin and aspirin-like medicines certain medicines that treat or prevent blood clots like warfarin, apixaban, dabigatran, and rivaroxaban cisplatin cyclosporine diuretics medicines for infection like acyclovir, adefovir, amphotericin B, bacitracin, cidofovir, foscarnet, ganciclovir, gentamicin, pentamidine, vancomycin NSAIDs, medicines for pain and inflammation, like ibuprofen or naproxen other medicines that prolong the QT  interval (an abnormal heart rhythm) pamidronate zoledronic acid This list may not describe all possible interactions. Give your health care provider a list of all the medicines, herbs, non-prescription drugs, or dietary supplements you use. Also tell them if you smoke, drink alcohol, or use illegal drugs. Some items may interact with your medicine. What should I watch for while using this medication? Your condition will be monitored carefully while you are receiving this medicine. You may need blood work done while you are taking this medicine. This medicine may make you feel generally unwell. This is not uncommon as chemotherapy can affect healthy cells as well as cancer cells. Report any side effects. Continue your course of treatment even though you feel ill unless your healthcare professional tells  you to stop. This medicine can make you more sensitive to cold. Do not drink cold drinks or use ice. Cover exposed skin before coming in contact with cold temperatures or cold objects. When out in cold weather wear warm clothing and cover your mouth and nose to warm the air that goes into your lungs. Tell your doctor if you get sensitive to the cold. Do not become pregnant while taking this medicine or for 9 months after stopping it. Women should inform their health care professional if they wish to become pregnant or think they might be pregnant. Men should not father a child while taking this medicine and for 6 months after stopping it. There is potential for serious side effects to an unborn child. Talk to your health care professional for more information. Do not breast-feed a child while taking this medicine or for 3 months after stopping it. This medicine has caused ovarian failure in some women. This medicine may make it more difficult to get pregnant. Talk to your health care professional if you are concerned about your fertility. This medicine has caused decreased sperm counts in some men. This may make it more difficult to father a child. Talk to your health care professional if you are concerned about your fertility. This medicine may increase your risk of getting an infection. Call your health care professional for advice if you get a fever, chills, or sore throat, or other symptoms of a cold or flu. Do not treat yourself. Try to avoid being around people who are sick. Avoid taking medicines that contain aspirin, acetaminophen, ibuprofen, naproxen, or ketoprofen unless instructed by your health care professional. These medicines may hide a fever. Be careful brushing or flossing your teeth or using a toothpick because you may get an infection or bleed more easily. If you have any dental work done, tell your dentist you are receiving this medicine. What side effects may I notice from receiving  this medication? Side effects that you should report to your doctor or health care professional as soon as possible: allergic reactions like skin rash, itching or hives, swelling of the face, lips, or tongue breathing problems cough low blood counts - this medicine may decrease the number of white blood cells, red blood cells, and platelets. You may be at increased risk for infections and bleeding nausea, vomiting pain, redness, or irritation at site where injected pain, tingling, numbness in the hands or feet signs and symptoms of bleeding such as bloody or black, tarry stools; red or dark brown urine; spitting up blood or brown material that looks like coffee grounds; red spots on the skin; unusual bruising or bleeding from the eyes, gums, or nose signs and symptoms of a dangerous change in heartbeat  or heart rhythm like chest pain; dizziness; fast, irregular heartbeat; palpitations; feeling faint or lightheaded; falls signs and symptoms of infection like fever; chills; cough; sore throat; pain or trouble passing urine signs and symptoms of liver injury like dark yellow or brown urine; general ill feeling or flu-like symptoms; light-colored stools; loss of appetite; nausea; right upper belly pain; unusually weak or tired; yellowing of the eyes or skin signs and symptoms of low red blood cells or anemia such as unusually weak or tired; feeling faint or lightheaded; falls signs and symptoms of muscle injury like dark urine; trouble passing urine or change in the amount of urine; unusually weak or tired; muscle pain; back pain Side effects that usually do not require medical attention (report to your doctor or health care professional if they continue or are bothersome): changes in taste diarrhea gas hair loss loss of appetite mouth sores This list may not describe all possible side effects. Call your doctor for medical advice about side effects. You may report side effects to FDA at  1-800-FDA-1088. Where should I keep my medication? This drug is given in a hospital or clinic and will not be stored at home. NOTE: This sheet is a summary. It may not cover all possible information. If you have questions about this medicine, talk to your doctor, pharmacist, or health care provider.  2022 Elsevier/Gold Standard (2021-06-06 00:00:00)

## 2021-11-01 NOTE — Assessment & Plan Note (Addendum)
#   Omental caking/peritoneal thickening/ascites-s/p biopsy adenocarcinoma of colorectal origin.  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  No discrete hypermetabolic rectal or colonic mass is identified.  Stage IV cancer.  # I discussed that FOLFOX chemotherapy is given every 2 weeks; discuss the potential side effects including but not limited to nausea vomiting diarrhea, sores in the mouth, hand-foot syndrome; also tingling and numbness/cold sensitivity with oxaliplatin.  Patient family understands treatments are palliative not curative.  We will plan to add Avastin with next visit. Reviewed the rationale for using Avastin. Discussed the potential side effects including but not limited to elevated blood pressure ; renal side effects, wound healing problems.  # proceed with FOLFOX today. Labs today reviewed;  acceptable for treatment today.  Recommend getting Imodium-use as directed for diarrhea  #Malignant ascites/abdominal discomfort/reflux-recommend Prilosec/over-the-counter 1 pill twice a day 1 hour before meals-for reflux; continue Tums as needed.  If worse would recommend paracentesis.  #Right lower chest wall pain/upper quadrant pain-likely secondary to malignancy [[on MS Contin; Percocet; Dr.Marks, [place]; [facility]] .  Discussed with Dr. Pecola Lawless will continue with following up with his pain physician; and if patient's pain is uncontrollable/needs adjustments-we can take over patient's narcotic prescriptions.  #  Large area of hypermetabolism involving the right aspect of the tongue without discrete mass seen on CT-clinically no mass seen.  ENT referral  # COPD-chronic STABLE  # IV access: s/p mediport-functional-   SIL- Anneette- [phone]/cell; H- [phone]  # DISPOSITION: # referral to [facility] re: pallaitive care- in 2 weeks # referral to [facility]- re: abnormal PET- right side of tongue #  referral to [facility] re: weight loss # 1 week- labs- cb/bmp; IVFs over 1 hour # FOLFOX chemo today; pump off d-3 # follow up in 2 weeks- MD; labs- cbc/cmp;cea;UA; FOLFOX +bev; pump off on D-3- Dr.B

## 2021-11-02 ENCOUNTER — Other Ambulatory Visit: Payer: Self-pay

## 2021-11-02 ENCOUNTER — Inpatient Hospital Stay: Payer: Medicare PPO

## 2021-11-02 ENCOUNTER — Inpatient Hospital Stay (HOSPITAL_BASED_OUTPATIENT_CLINIC_OR_DEPARTMENT_OTHER): Payer: Medicare PPO | Admitting: Hospice and Palliative Medicine

## 2021-11-02 ENCOUNTER — Other Ambulatory Visit: Payer: Self-pay | Admitting: *Deleted

## 2021-11-02 ENCOUNTER — Other Ambulatory Visit: Payer: Self-pay | Admitting: Internal Medicine

## 2021-11-02 ENCOUNTER — Telehealth: Payer: Self-pay

## 2021-11-02 VITALS — BP 117/75 | HR 95 | Temp 99.9°F | Resp 18

## 2021-11-02 DIAGNOSIS — C20 Malignant neoplasm of rectum: Secondary | ICD-10-CM

## 2021-11-02 DIAGNOSIS — C786 Secondary malignant neoplasm of retroperitoneum and peritoneum: Secondary | ICD-10-CM

## 2021-11-02 DIAGNOSIS — R110 Nausea: Secondary | ICD-10-CM

## 2021-11-02 DIAGNOSIS — Z5111 Encounter for antineoplastic chemotherapy: Secondary | ICD-10-CM | POA: Diagnosis not present

## 2021-11-02 DIAGNOSIS — Z95828 Presence of other vascular implants and grafts: Secondary | ICD-10-CM

## 2021-11-02 LAB — CBC WITH DIFFERENTIAL/PLATELET
Abs Immature Granulocytes: 0.05 10*3/uL (ref 0.00–0.07)
Basophils Absolute: 0 10*3/uL (ref 0.0–0.1)
Basophils Relative: 0 %
Eosinophils Absolute: 0 10*3/uL (ref 0.0–0.5)
Eosinophils Relative: 0 %
HCT: 33 % — ABNORMAL LOW (ref 39.0–52.0)
Hemoglobin: 11.2 g/dL — ABNORMAL LOW (ref 13.0–17.0)
Immature Granulocytes: 1 %
Lymphocytes Relative: 6 %
Lymphs Abs: 0.6 10*3/uL — ABNORMAL LOW (ref 0.7–4.0)
MCH: 28.6 pg (ref 26.0–34.0)
MCHC: 33.9 g/dL (ref 30.0–36.0)
MCV: 84.4 fL (ref 80.0–100.0)
Monocytes Absolute: 0.9 10*3/uL (ref 0.1–1.0)
Monocytes Relative: 8 %
Neutro Abs: 9.3 10*3/uL — ABNORMAL HIGH (ref 1.7–7.7)
Neutrophils Relative %: 85 %
Platelets: 314 10*3/uL (ref 150–400)
RBC: 3.91 MIL/uL — ABNORMAL LOW (ref 4.22–5.81)
RDW: 14 % (ref 11.5–15.5)
WBC: 10.9 10*3/uL — ABNORMAL HIGH (ref 4.0–10.5)
nRBC: 0 % (ref 0.0–0.2)

## 2021-11-02 LAB — COMPREHENSIVE METABOLIC PANEL
ALT: 19 U/L (ref 0–44)
AST: 30 U/L (ref 15–41)
Albumin: 3 g/dL — ABNORMAL LOW (ref 3.5–5.0)
Alkaline Phosphatase: 63 U/L (ref 38–126)
Anion gap: 6 (ref 5–15)
BUN: 23 mg/dL (ref 8–23)
CO2: 29 mmol/L (ref 22–32)
Calcium: 8.7 mg/dL — ABNORMAL LOW (ref 8.9–10.3)
Chloride: 93 mmol/L — ABNORMAL LOW (ref 98–111)
Creatinine, Ser: 0.67 mg/dL (ref 0.61–1.24)
GFR, Estimated: >60 mL/min (ref >60)
Glucose, Bld: 110 mg/dL — ABNORMAL HIGH (ref 70–99)
Potassium: 3.9 mmol/L (ref 3.5–5.1)
Sodium: 128 mmol/L — ABNORMAL LOW (ref 135–145)
Total Bilirubin: 0.4 mg/dL (ref 0.3–1.2)
Total Protein: 6.5 g/dL (ref 6.5–8.1)

## 2021-11-02 LAB — MAGNESIUM: Magnesium: 1.8 mg/dL (ref 1.7–2.4)

## 2021-11-02 LAB — CEA: CEA: 935 ng/mL — ABNORMAL HIGH (ref 0.0–4.7)

## 2021-11-02 MED ORDER — DEXAMETHASONE SODIUM PHOSPHATE 10 MG/ML IJ SOLN
10.0000 mg | Freq: Once | INTRAMUSCULAR | Status: AC
  Administered 2021-11-02: 10 mg via INTRAVENOUS
  Filled 2021-11-02: qty 1

## 2021-11-02 MED ORDER — SODIUM CHLORIDE 0.9% FLUSH
10.0000 mL | Freq: Once | INTRAVENOUS | Status: DC
  Filled 2021-11-02: qty 10

## 2021-11-02 MED ORDER — SODIUM CHLORIDE 0.9 % IV SOLN
INTRAVENOUS | Status: AC
  Filled 2021-11-02: qty 250

## 2021-11-02 MED ORDER — SODIUM CHLORIDE 0.9 % IV SOLN: 10.0000 mg | Freq: Once | INTRAVENOUS | Status: DC

## 2021-11-02 NOTE — Telephone Encounter (Signed)
Per Merrily Pew B order labs: cbc,cmp mag--orders placed.

## 2021-11-02 NOTE — Telephone Encounter (Signed)
We received a message from the on call service, he had chemo yesterday has vomiting and fever. [phone] Annette SIL.  Roberto Collins, pt would like to be seen today, spoke with SIL Anne Ng, has being vomiting since 5 pm yesterday, can't keep any liquids down, if he moves he's vomiting. No fever. And can't eat. Would like to be seen in [facility] today, can you sch?    Pt was sch'd with Josh Borders today (11/02/21) at 2:15, SIL notified.

## 2021-11-02 NOTE — Progress Notes (Signed)
Pt in [facility] with complaints of nausea and vomiting since last night. Pt started first cycle of chemo yesterday. Pt states that he has been unable to eat or drink anything. He has been unable to take any of his medications today due to the nausea/vomiting.

## 2021-11-02 NOTE — Progress Notes (Signed)
Symptom Management [place] at [facility] Telephone:([phone]) [phone] Fax:([phone]) [phone]  Patient Care Team: [name], PA-C as PCP - General (Family Medicine) Kate Sable, MD as PCP - Cardiology (Cardiology) Virgel Manifold, MD (Inactive) as Consulting Physician (Gastroenterology) Luetta Nutting Sharyn Dross, MD as Referring Physician (Pain Medicine) Cammie Sickle, MD as Consulting Physician (Medical Oncology) Kate Sable, MD as Consulting Physician (Cardiology) [name], MD as Consulting Physician (Colon and Rectal Surgery)   Name of the patient: Roberto Collins  [id number]  Jul 07, 1951   Date of visit: 11/02/21  Reason for Consult: Roberto Collins is a 71 y.o. 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  Patient received cycle 1 FOLFOX on 11/01/2021.  He now presents to [facility] for evaluation of nausea and vomiting.  Patient reports about 12 hours of nausea with approximately 5 episodes of vomiting.  He denies fever or chills.  No abdominal pain.  No diarrhea or constipation.  No GU symptoms.  Patient has been unclear on what medications he should take when.  He reports having taken Imodium thinking that would help with his nausea.  Denies any neurologic complaints. Patient offers no further specific complaints today.    PAST MEDICAL HISTORY: Past Medical History:  Diagnosis Date   Anemia    Asthma    Back pain    Blood transfusion without reported diagnosis     PAST SURGICAL HISTORY:  Past Surgical History:  Procedure Laterality Date   BACK SURGERY  2000   COLONOSCOPY WITH PROPOFOL N/A 04/02/2019   Procedure: COLONOSCOPY WITH PROPOFOL;  Surgeon: Virgel Manifold, MD;  Location: [facility] ENDOSCOPY;  Service: Endoscopy;  Laterality: N/A;   COLONOSCOPY WITH PROPOFOL N/A 04/20/2020   Procedure:  COLONOSCOPY WITH PROPOFOL;  Surgeon: Virgel Manifold, MD;  Location: [facility] ENDOSCOPY;  Service: Endoscopy;  Laterality: N/A;   COLONOSCOPY WITH PROPOFOL N/A 07/28/2021   Procedure: COLONOSCOPY WITH PROPOFOL;  Surgeon: Virgel Manifold, MD;  Location: [facility] ENDOSCOPY;  Service: Endoscopy;  Laterality: N/A;   ESOPHAGOGASTRODUODENOSCOPY Left 03/31/2019   Procedure: ESOPHAGOGASTRODUODENOSCOPY (EGD);  Surgeon: Virgel Manifold, MD;  Location: [facility] ENDOSCOPY;  Service: Endoscopy;  Laterality: Left;   GIVENS CAPSULE STUDY N/A 04/02/2019   Procedure: GIVENS CAPSULE STUDY;  Surgeon: Virgel Manifold, MD;  Location: [facility] ENDOSCOPY;  Service: Endoscopy;  Laterality: N/A;   IR IMAGING GUIDED PORT INSERTION  10/27/2021   XI ROBOT ASSISTED TRANSANAL RESECTION N/A 06/25/2019   Procedure: XI ROBOT ASSISTED PARTIAL PROCTECTOMY OF RECTAL MASS USING TAMIS;  Surgeon: [name], MD;  Location: [facility] ORS;  Service: General;  Laterality: N/A;    HEMATOLOGY/ONCOLOGY HISTORY:  Oncology History Overview Note  # JULy 2020- RECTAL MASS-frond-like/villous non-obstructing large mass partially circumferential (involving one half of the lumen circumference; Dr.Tahiliani]; CT C/A/P- NEGATIVE METASTATIC DISEASE; Bx- FRAGMENTS OF TUBULOVILLOUS ADENOMA WITH FOCAL HIGH-GRADE DYSPLASIA; - INVASIVE CARCINOMA NOT IDENTIFIED.   # July 2020- Severe IDA- Hb 3.5 [s/p PRBC]  SURGICAL PATHOLOGY  CASE: [id number]  PATIENT: [facility]  Surgical Pathology Report      Specimen Submitted:  A. Colon polyp x8, transverse; cold snare  B. Colon polyp x2, sigmoid; cold snare  C. Rectum polyp; cbx   Clinical History: Colon cancer screening Z12.11.  History of colonic  polyps Z86.010.  Colon polyps       DIAGNOSIS:  A. COLON POLYPS X8, TRANSVERSE; COLD SNARE:  - MULTIPLE FRAGMENTS  OF TUBULAR ADENOMAS.  - NEGATIVE FOR HIGH-GRADE DYSPLASIA OR MALIGNANCY.   B. COLON POLYPS X2, SIGMOID; COLD SNARE:  - MULTIPLE  FRAGMENTS OF TUBULAR ADENOMAS.  - NEGATIVE FOR HIGH-GRADE DYSPLASIA AND MALIGNANCY.   C. RECTAL POLYP; COLD BIOPSY:  - SUPERFICIAL STRIPS OF TUBULOVILLOUS ADENOMA.  - DEFINITE HIGH-GRADE DYSPLASIA IS NOT IDENTIFIED.  - NEGATIVE FOR MALIGNANCY IN A LIMITED SAMPLING.   Comment:  The patient's prior history of villous adenoma with high-grade dysplasia  involving the rectum is noted.  Biopsy sections from the rectal polyp  display superficial strips of villous tissue with adenomatous changes.  Definite high-grade dysplasia and malignancy are not identified in the  current specimen.  However, evaluation is limited by the superficial  nature of the sampling.  Clinical and endoscopic correlation is  recommended.   IMPRESSION: 1. 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 2. Amorphous ill-defined hypodense masslike density at the lower rectum which may represent the known rectal cancer, limited visualization. 3. Other ancillary findings as described.     Electronically Signed   By: Ofilia Neas M.D.   On: 09/20/2021 10:41  31st, JAN 2023-  A. OMENTUM, LEFT LATERAL; CT-GUIDED CORE NEEDLE BIOPSY:  - POSITIVE FOR MALIGNANCY.  - METASTATIC ADENOCARCINOMA WITH MUCINOUS FEATURES, COMPATIBLE WITH  COLORECTAL PRIMARY.  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  MEDICATIONS:  Current Outpatient Medications  Medication Sig Dispense Refill   albuterol (VENTOLIN HFA) 108 (90 Base) MCG/ACT inhaler Inhale 2 puffs into the lungs every 6 (six) hours as needed for wheezing or shortness of breath.     aspirin EC 81 MG tablet Take 81 mg by mouth daily. Swallow whole.     chlorproMAZINE (THORAZINE) 100 MG tablet Take 300 mg by mouth at bedtime.     Cholecalciferol (VITAMIN D) 50 MCG (2000 UT) CAPS Take 2,000 Units by mouth daily.     cyclobenzaprine (FLEXERIL) 10 MG tablet Take 10 mg by mouth at bedtime.     cyclobenzaprine (FLEXERIL) 10 MG tablet Take 10 mg by mouth 3 (three) times daily as needed for muscle spasms.     dexamethasone (DECADRON) 4 MG tablet Take 2 tablets (8 mg total) by mouth daily. Start the day after chemotherapy for 2 days. Take with food. 30 tablet 1   doxepin (SINEQUAN) 25 MG capsule Take 25 mg by mouth at bedtime.     fluticasone-salmeterol (ADVAIR) 250-50 MCG/ACT AEPB Inhale 1 puff into the lungs in the morning and at bedtime. (Patient not taking: Reported on 10/24/2021)     gabapentin (NEURONTIN) 600 MG tablet Take 1,800 mg by mouth at bedtime.      lidocaine-prilocaine (EMLA) cream Apply to affected area once 30 g 3   morphine (MS  CONTIN) 60 MG 12 hr tablet Take 60 mg by mouth every 12 (twelve) hours.     nystatin (MYCOSTATIN) 100000 UNIT/ML suspension Take 5 mLs (500,000 Units total) by mouth 4 (four) times daily. 60 mL 0   ondansetron (ZOFRAN) 8 MG tablet Take 1 tablet (8 mg total) by mouth 2 (two) times daily as needed for refractory nausea / vomiting. Start on day 3 after chemotherapy. 30 tablet 1   oxyCODONE-acetaminophen (PERCOCET/ROXICET) 5-325 MG tablet Take 1 tablet by mouth every 4 (four) hours as needed for severe pain. (Patient not taking: Reported on 10/24/2021)      prochlorperazine (COMPAZINE) 10 MG tablet Take 1 tablet (10 mg total) by mouth every 6 (six) hours as needed (Nausea or vomiting). 30 tablet 1   rOPINIRole (REQUIP) 1 MG tablet Take 1 mg by mouth at bedtime as needed. (Patient not taking: Reported on 11/01/2021)     rosuvastatin (CRESTOR) 5 MG tablet Take 1 tablet (5 mg total) by mouth at bedtime. 90 tablet 3   Sod Picosulfate-Mag Ox-Cit Acd (CLENPIQ) 10-3.5-12 MG-GM -GM/160ML SOLN Take 1 kit by mouth as directed. At 5 PM evening before procedure, drink 1 bottle of Clenpiq, hydrate, drink (5) 8 oz of water. Then do the same thing 5 hours prior to your procedure. (Patient not taking: Reported on 10/24/2021) 320 mL 0   No current facility-administered medications for this visit.    VITAL SIGNS: There were no vitals taken for this visit. There were no vitals filed for this visit.  Estimated body mass index is 17.53 kg/m as calculated from the following:   Height as of 11/01/21: _0  (1.778 m).   Weight as of 11/01/21: 122 lb 3.2 oz (55.4 kg).  LABS: CBC:    Component Value Date/Time   WBC 9.0 11/01/2021 0806   HGB 11.2 (L) 11/01/2021 0806   HCT 33.6 (L) 11/01/2021 0806   PLT 350 11/01/2021 0806   MCV 86.4 11/01/2021 0806   NEUTROABS 7.5 11/01/2021 0806   LYMPHSABS 0.6 (L) 11/01/2021 0806   MONOABS 0.8 11/01/2021 0806   EOSABS 0.0 11/01/2021 0806   BASOSABS 0.0 11/01/2021 0806   Comprehensive Metabolic Panel:    Component Value Date/Time   NA 131 (L) 11/01/2021 0806   K 3.7 11/01/2021 0806   CL 95 (L) 11/01/2021 0806   CO2 27 11/01/2021 0806   BUN 25 (H) 11/01/2021 0806   CREATININE 0.89 11/01/2021 0806   CREATININE 0.78 10/24/2021 1427   GLUCOSE 133 (H) 11/01/2021 0806   CALCIUM 8.5 (L) 11/01/2021 0806   AST 27 11/01/2021 0806   ALT 17 11/01/2021 0806   ALKPHOS 60 11/01/2021 0806   BILITOT 0.2 (L) 11/01/2021 0806   PROT 6.3 (L) 11/01/2021 0806   ALBUMIN 3.0 (L) 11/01/2021 0806    RADIOGRAPHIC STUDIES: NM PET Image Initial  (PI) Skull Base To Thigh (F-18 FDG)  Result Date: 10/13/2021 CLINICAL DATA:  Initial treatment strategy for rectal cancer. Peritoneal carcinomatosis. EXAM: NUCLEAR MEDICINE PET SKULL BASE TO THIGH TECHNIQUE: 6.8 mCi F-18 FDG was injected intravenously. Full-ring PET imaging was performed from the skull base to thigh after the radiotracer. CT data was obtained and used for attenuation correction and anatomic localization. Fasting blood glucose: 104 mg/dl COMPARISON:  CT scan 09/19/2021 FINDINGS: Mediastinal blood pool activity: SUV max 1.95 Liver activity: SUV max NA NECK: Large area hypermetabolism associated with the right aspect of the tongue worrisome for neoplasm. SUV max is 9.72. Difficult to see a discrete mass on  the CT scan. Recommend correlation with direct visualization. No associated enlarged or hypermetabolic neck nodes. Incidental CT findings: Bilateral carotid artery calcifications. CHEST: No hypermetabolic mediastinal or hilar nodes. No suspicious pulmonary nodules on the CT scan. Incidental CT findings: Advanced emphysematous changes and areas of pulmonary scarring. Scattered atherosclerotic calcifications involving the aorta and coronary arteries. Small bilateral pleural effusions. ABDOMEN/PELVIS: Diffuse and extensive peritoneal carcinomatosis with extensive soft tissue nodularity throughout the abdomen and pelvis which demonstrates hypermetabolism. SUV max ranges between 3.38 and 4.20. No discrete hypermetabolic rectal mass is identified. No other colonic lesions are identified. No findings to suggest hepatic metastatic disease. Incidental CT findings: Age advanced atherosclerotic calcifications involving the aorta and iliac arteries but no aneurysm. SKELETON: No PET-CT findings to suggest skeletal metastasis. Incidental CT findings: Surgical changes involving the lumbar spine with lumbar fusion hardware. IMPRESSION: 1. Diffuse peritoneal carcinomatosis and associated large volume  abdominal/pelvic ascites. 2. No discrete hypermetabolic rectal or colonic mass is identified. 3. No findings for hepatic metastatic disease or metastatic disease involving the chest or bony structures. 4. Large area of hypermetabolism involving the right aspect of the tongue without discrete mass seen on CT. Findings worrisome for a tongue cancer. Recommend correlation with direct visualization. No associated hypermetabolic neck adenopathy. 5. Emphysematous changes and pulmonary scarring. 6. Age advanced vascular disease. Electronically Signed   By: Marijo Sanes M.D.   On: 10/13/2021 08:42   US Paracentesis  Result Date: 10/04/2021 INDICATION: Rectal mass with peritoneal carcinomatosis and ascites request received for diagnostic and therapeutic paracentesis. EXAM: ULTRASOUND GUIDED  PARACENTESIS MEDICATIONS: Local 1% lidocaine only. COMPLICATIONS: None immediate. PROCEDURE: Informed written consent was obtained from the patient after a discussion of the risks, benefits and alternatives to treatment. A timeout was performed prior to the initiation of the procedure. Initial ultrasound scanning demonstrates a small amount of ascites within the right lower abdominal quadrant. The right lower abdomen was prepped and draped in the usual sterile fashion. 1% lidocaine was used for local anesthesia. Following this, a 19 gauge, 7-cm, Yueh catheter was introduced. An ultrasound image was saved for documentation purposes. The paracentesis was performed. The catheter was removed and a dressing was applied. The patient tolerated the procedure well without immediate post procedural complication. FINDINGS: A total of approximately 2 L of amber colored fluid was removed. Samples were sent to the laboratory as requested by the clinical team. IMPRESSION: Successful ultrasound-guided paracentesis yielding 2 liters of peritoneal fluid. This exam was performed by Tsosie Billing PA-C, and was supervised and interpreted by Dr. Annamaria Boots.  Electronically Signed   By: Jerilynn Mages.  Shick M.D.   On: 10/04/2021 13:48   CT BIOPSY  Result Date: 10/27/2021 CLINICAL DATA:  Peritoneal carcinomatosis and ascites of unknown primary with prior paracentesis demonstrating malignant cells in ascites. Material was not sufficient for additional molecular analysis and core biopsy has been requested for further tissue analysis. EXAM: CT GUIDED CORE BIOPSY OF PERITONEAL/OMENTAL TUMOR ANESTHESIA/SEDATION: Moderate (conscious) sedation was employed during this procedure. A total of Versed 1.0 mg and Fentanyl 50 mcg was administered intravenously by radiology nursing. Moderate Sedation Time: 12 minutes. The patient's level of consciousness and vital signs were monitored continuously by radiology nursing throughout the procedure under my direct supervision. PROCEDURE: The procedure risks, benefits, and alternatives were explained to the patient. Questions regarding the procedure were encouraged and answered. The patient understands and consents to the procedure. A time-out was performed prior to initiating the procedure. CT was performed through the abdomen and pelvis. The  left lower abdominal wall was prepped with chlorhexidine in a sterile fashion, and a sterile drape was applied covering the operative field. A sterile gown and sterile gloves were used for the procedure. Local anesthesia was provided with 1% Lidocaine. Under CT guidance, a 17 gauge trocar needle was advanced from a left anterior approach into the left peritoneal cavity. After confirming needle tip position, coaxial 18 gauge core biopsy samples were obtained. Four separate core biopsy samples were obtained and submitted in formalin. COMPLICATIONS: None FINDINGS: A region of peritoneal tumor demonstrating increased metabolic activity by PET scan in the left lateral lower peritoneal cavity was targeted. Solid, mucinous appearing tumor was obtained. IMPRESSION: CT-guided core biopsy performed peritoneal/omental  tumor in the left lateral lower peritoneal cavity. Solid tissue was obtained. RADIATION DOSE REDUCTION: This exam was performed according to the departmental dose-optimization program which includes automated exposure control, adjustment of the mA and/or kV according to patient size and/or use of iterative reconstruction technique. Electronically Signed   By: Aletta Edouard M.D.   On: 10/27/2021 13:34   IR IMAGING GUIDED PORT INSERTION  Result Date: 10/27/2021 CLINICAL DATA:  Peritoneal carcinomatosis and need for porta cath for chemotherapy. EXAM: IMPLANTED PORT A CATH PLACEMENT WITH ULTRASOUND AND FLUOROSCOPIC GUIDANCE ANESTHESIA/SEDATION: Moderate (conscious) sedation was employed during this procedure. A total of Versed 1.0 mg and Fentanyl 50 mcg was administered intravenously by radiology nursing. Moderate Sedation Time: 23 minutes. The patient's level of consciousness and vital signs were monitored continuously by radiology nursing throughout the procedure under my direct supervision. FLUOROSCOPY TIME:  42 seconds.  3.3 mGy. PROCEDURE: The procedure, risks, benefits, and alternatives were explained to the patient. Questions regarding the procedure were encouraged and answered. The patient understands and consents to the procedure. A time-out was performed prior to initiating the procedure. Ultrasound was utilized to confirm patency of the right internal jugular vein. A permanent ultrasound image was recorded. The right neck and chest were prepped with chlorhexidine in a sterile fashion, and a sterile drape was applied covering the operative field. Maximum barrier sterile technique with sterile gowns and gloves were used for the procedure. Local anesthesia was provided with 1% lidocaine. After creating a small venotomy incision, a 21 gauge needle was advanced into the right internal jugular vein under direct, real-time ultrasound guidance. Ultrasound image documentation was performed. After securing  guidewire access, an 8 Fr dilator was placed. A J-wire was kinked to measure appropriate catheter length. A subcutaneous port pocket was then created along the upper chest wall utilizing sharp and blunt dissection. Portable cautery was utilized. The pocket was irrigated with sterile saline. A single lumen power injectable port was chosen for placement. The 8 Fr catheter was tunneled from the port pocket site to the venotomy incision. The port was placed in the pocket. External catheter was trimmed to appropriate length based on guidewire measurement. At the venotomy, an 8 Fr peel-away sheath was placed over a guidewire. The catheter was then placed through the sheath and the sheath removed. Final catheter positioning was confirmed and documented with a fluoroscopic spot image. The port was accessed with a needle and aspirated and flushed with heparinized saline. The access needle was removed. The venotomy and port pocket incisions were closed with subcutaneous 3-0 Monocryl and subcuticular 4-0 Vicryl. Dermabond was applied to both incisions. COMPLICATIONS: COMPLICATIONS None FINDINGS: After catheter placement, the tip lies at the cavo-atrial junction. The catheter aspirates normally and is ready for immediate use. IMPRESSION: Placement of single lumen port  a cath via right internal jugular vein. The catheter tip lies at the cavo-atrial junction. A power injectable port a cath was placed and is ready for immediate use. Electronically Signed   By: Aletta Edouard M.D.   On: 10/27/2021 13:35    PERFORMANCE STATUS (ECOG) : 3 - Symptomatic, >50% confined to bed  Review of Systems Unless otherwise noted, a complete review of systems is negative.  Physical Exam General: NAD Cardiovascular: regular rate and rhythm Pulmonary: clear ant fields Abdomen: nontender, + bowel sounds, distention, positive fluid wave GU: no suprapubic tenderness Extremities: no edema, no joint deformities Skin: no rashes Neurological:  Weakness but otherwise nonfocal  Assessment and Plan- Patient is a 71 y.o. 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  Patient received cycle 1 FOLFOX on 11/01/2021.  He now presents to [facility] for evaluation of nausea and vomiting.  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  Patient is frail at baseline and will likely need significant and frequent supportive care.  We will send referral to palliative care. Patient will benefit from conversations regarding goals/code status. ACP documents on file.    Nausea and vomiting -likely symptoms secondary to chemotherapy, which patient received first cycle yesterday.  Labs reflective of hyponatremia likely in the context of dehydration.  We will proceed with IV fluids and antiemetics/steroids today.  Abdomen is distended with positive fluid wave.  Suspect recurrence of ascites, which may also be exacerbating his nausea.  We will proceed with therapeutic paracentesis.  We will plan to bring patient back tomorrow for fluid clinic.  Referral for nutrition.  Weakness -patient lives at home with limited social support.  His primary caregiver is his sister-in-law, who lives about 30 minutes away in [place].  We will refer to home health PT/OT.  Also send referral for home health aide and RN to assist with medication management.  Patient has significant confusion regarding his medications.  These were reviewed in detail with him today.  He was sent home with pillboxes to assist with medication organization/administration.  Case and plan discussed with Dr. Rogue Bussing   Patient expressed understanding and was in agreement with this plan. He also understands that He can call clinic at any time with any questions, concerns, or complaints.   Thank you for allowing me to participate in the care of this  very pleasant patient.   Time Total: 25 minutes  Visit consisted of counseling and education dealing with the complex and emotionally intense issues of symptom management in the setting of serious illness.Greater than 50%  of this time was spent counseling and coordinating care related to the above assessment and plan.  Signed by: Altha Harm, PhD, NP-C

## 2021-11-03 ENCOUNTER — Other Ambulatory Visit: Payer: Self-pay

## 2021-11-03 ENCOUNTER — Inpatient Hospital Stay: Payer: Medicare PPO

## 2021-11-03 ENCOUNTER — Other Ambulatory Visit: Payer: Self-pay | Admitting: Hospice and Palliative Medicine

## 2021-11-03 ENCOUNTER — Ambulatory Visit
Admission: RE | Admit: 2021-11-03 | Discharge: 2021-11-03 | Disposition: A | Discharge status: Home / Self Care | Payer: Medicare PPO | Source: Ambulatory Visit | Attending: Hospice and Palliative Medicine | Admitting: Hospice and Palliative Medicine

## 2021-11-03 ENCOUNTER — Telehealth: Payer: Self-pay

## 2021-11-03 VITALS — BP 120/80 | HR 91 | Temp 97.6°F | Resp 19

## 2021-11-03 DIAGNOSIS — C786 Secondary malignant neoplasm of retroperitoneum and peritoneum: Secondary | ICD-10-CM

## 2021-11-03 DIAGNOSIS — D500 Iron deficiency anemia secondary to blood loss (chronic): Secondary | ICD-10-CM

## 2021-11-03 DIAGNOSIS — C20 Malignant neoplasm of rectum: Secondary | ICD-10-CM

## 2021-11-03 DIAGNOSIS — Z5111 Encounter for antineoplastic chemotherapy: Secondary | ICD-10-CM | POA: Diagnosis not present

## 2021-11-03 MED ORDER — SODIUM CHLORIDE 0.9% FLUSH
10.0000 mL | INTRAVENOUS | Status: DC | PRN
  Administered 2021-11-03: 10 mL
  Filled 2021-11-03: qty 10

## 2021-11-03 MED ORDER — HEPARIN SOD (PORK) LOCK FLUSH 100 UNIT/ML IV SOLN
500.0000 [IU] | Freq: Once | INTRAVENOUS | Status: AC | PRN
  Filled 2021-11-03: qty 5

## 2021-11-03 MED ORDER — SODIUM CHLORIDE 0.9 % IV SOLN
Freq: Once | INTRAVENOUS | Status: AC
  Filled 2021-11-03: qty 250

## 2021-11-03 MED ORDER — HEPARIN SOD (PORK) LOCK FLUSH 100 UNIT/ML IV SOLN
INTRAVENOUS | Status: AC
  Administered 2021-11-03: 500 [IU]
  Filled 2021-11-03: qty 5

## 2021-11-03 NOTE — Telephone Encounter (Signed)
Called Annette back and told her its his regular [facility]ine follow-up appointment. If patient would want to reschedule that would be totally up to him. Anne Ng stated they will keep appointment.

## 2021-11-03 NOTE — Telephone Encounter (Signed)
Copied from [place] [id number]. Topic: Appointment Scheduling - Scheduling Inquiry for Clinic >> Nov 03, 2021 11:32 AM McGill, Nelva Bush wrote: Reason for CRM: (Sister in law) Anne Ng, calling to ask if pt needs to keep appointment for 11/13/2021. Anne Ng, stated pt has a lot of appointments infusions etc and wants to know if its necessary for him to come to this follow up.   Requesting a call back.

## 2021-11-05 ENCOUNTER — Encounter: Payer: Self-pay | Admitting: Internal Medicine

## 2021-11-06 ENCOUNTER — Encounter: Payer: Self-pay | Admitting: Licensed Clinical Social Worker

## 2021-11-06 NOTE — Progress Notes (Signed)
[place] Work  Clinical Social Work was referred by Billey Chang NP for assessment of psychosocial needs.  Clinical Social Worker contacted caregiver by phone, [name] (Sister) [phone] (Mobile) to offer support and assess for needs. CSW was unable to leave voicemail because voicemail is full.    Adelene Amas, [place]       First Attempt

## 2021-11-07 ENCOUNTER — Encounter: Payer: Self-pay | Admitting: Licensed Clinical Social Worker

## 2021-11-07 ENCOUNTER — Telehealth: Payer: Self-pay

## 2021-11-07 NOTE — Telephone Encounter (Signed)
Confirmation order received.

## 2021-11-07 NOTE — Telephone Encounter (Signed)
Incoming message from triage:   "Anne Ng called stating patient feeling weak today and would like to come in for IV fluids and she asks you call her [phone] or [phone]."  Returned call to [place]. She states that pt is weak and is not eating or drinking much. She asked if she could bring pt in this afternoon, but stated that she could not get here until 4-4:30. Pt has appointments in cancer center tomorrow morning, but advised Anne Ng that she would need to take pt to the ED if pt needed urgent attention. I also advised her to continue to push hydration/electrolytes such as powerade, soup, or beverages of pt's choice. She stated that she would talk to the patient and relay this information to him, as she thinks he would not want to go to the ED.

## 2021-11-07 NOTE — Progress Notes (Signed)
[place] CSW Progress Note  Clinical Education officer, museum contacted caregiver by phone to follow-up on referral request.  [name] was unavailable to speak and CSW stated I would contact her later on today or tomorrow.  [name] verbalized understanding.  Adelene Amas , LCSW

## 2021-11-08 ENCOUNTER — Other Ambulatory Visit: Payer: Self-pay

## 2021-11-08 ENCOUNTER — Inpatient Hospital Stay: Payer: Medicare PPO | Admitting: Occupational Therapy

## 2021-11-08 ENCOUNTER — Telehealth: Payer: Self-pay | Admitting: Hospice and Palliative Medicine

## 2021-11-08 ENCOUNTER — Inpatient Hospital Stay: Payer: Medicare PPO

## 2021-11-08 ENCOUNTER — Inpatient Hospital Stay (HOSPITAL_BASED_OUTPATIENT_CLINIC_OR_DEPARTMENT_OTHER): Payer: Medicare PPO | Admitting: Hospice and Palliative Medicine

## 2021-11-08 VITALS — BP 113/81 | Temp 98.0°F

## 2021-11-08 DIAGNOSIS — Z515 Encounter for palliative care: Secondary | ICD-10-CM | POA: Diagnosis not present

## 2021-11-08 DIAGNOSIS — Z5111 Encounter for antineoplastic chemotherapy: Secondary | ICD-10-CM | POA: Diagnosis not present

## 2021-11-08 DIAGNOSIS — Z95828 Presence of other vascular implants and grafts: Secondary | ICD-10-CM

## 2021-11-08 DIAGNOSIS — G893 Neoplasm related pain (acute) (chronic): Secondary | ICD-10-CM | POA: Diagnosis not present

## 2021-11-08 DIAGNOSIS — C786 Secondary malignant neoplasm of retroperitoneum and peritoneum: Secondary | ICD-10-CM

## 2021-11-08 DIAGNOSIS — M6281 Muscle weakness (generalized): Secondary | ICD-10-CM

## 2021-11-08 LAB — CBC WITH DIFFERENTIAL/PLATELET
Abs Immature Granulocytes: 0.05 10*3/uL (ref 0.00–0.07)
Basophils Absolute: 0 10*3/uL (ref 0.0–0.1)
Basophils Relative: 0 %
Eosinophils Absolute: 0.1 10*3/uL (ref 0.0–0.5)
Eosinophils Relative: 1 %
HCT: 32.7 % — ABNORMAL LOW (ref 39.0–52.0)
Hemoglobin: 10.8 g/dL — ABNORMAL LOW (ref 13.0–17.0)
Immature Granulocytes: 1 %
Lymphocytes Relative: 7 %
Lymphs Abs: 0.5 10*3/uL — ABNORMAL LOW (ref 0.7–4.0)
MCH: 28.6 pg (ref 26.0–34.0)
MCHC: 33 g/dL (ref 30.0–36.0)
MCV: 86.7 fL (ref 80.0–100.0)
Monocytes Absolute: 0.3 10*3/uL (ref 0.1–1.0)
Monocytes Relative: 5 %
Neutro Abs: 6.2 10*3/uL (ref 1.7–7.7)
Neutrophils Relative %: 86 %
Platelets: 166 10*3/uL (ref 150–400)
RBC: 3.77 MIL/uL — ABNORMAL LOW (ref 4.22–5.81)
RDW: 14.8 % (ref 11.5–15.5)
WBC: 7.2 10*3/uL (ref 4.0–10.5)
nRBC: 0 % (ref 0.0–0.2)

## 2021-11-08 LAB — COMPREHENSIVE METABOLIC PANEL
ALT: 18 U/L (ref 0–44)
AST: 24 U/L (ref 15–41)
Albumin: 2.8 g/dL — ABNORMAL LOW (ref 3.5–5.0)
Alkaline Phosphatase: 64 U/L (ref 38–126)
Anion gap: 8 (ref 5–15)
BUN: 25 mg/dL — ABNORMAL HIGH (ref 8–23)
CO2: 26 mmol/L (ref 22–32)
Calcium: 8.2 mg/dL — ABNORMAL LOW (ref 8.9–10.3)
Chloride: 101 mmol/L (ref 98–111)
Creatinine, Ser: 0.64 mg/dL (ref 0.61–1.24)
GFR, Estimated: >60 mL/min (ref >60)
Glucose, Bld: 109 mg/dL — ABNORMAL HIGH (ref 70–99)
Potassium: 3.5 mmol/L (ref 3.5–5.1)
Sodium: 135 mmol/L (ref 135–145)
Total Bilirubin: 0.3 mg/dL (ref 0.3–1.2)
Total Protein: 6.1 g/dL — ABNORMAL LOW (ref 6.5–8.1)

## 2021-11-08 MED ORDER — PANTOPRAZOLE SODIUM 20 MG PO TBEC: 20.0000 mg | DELAYED_RELEASE_TABLET | Freq: Every day | ORAL | 2 refills | Status: DC

## 2021-11-08 MED ORDER — SODIUM CHLORIDE 0.9% FLUSH
10.0000 mL | Freq: Once | INTRAVENOUS | Status: AC
  Administered 2021-11-08: 10 mL via INTRAVENOUS
  Filled 2021-11-08: qty 10

## 2021-11-08 MED ORDER — SODIUM CHLORIDE 0.9 % IV SOLN: 8.0000 mg | Freq: Once | INTRAVENOUS | Status: DC

## 2021-11-08 MED ORDER — DEXAMETHASONE SODIUM PHOSPHATE 10 MG/ML IJ SOLN
8.0000 mg | Freq: Once | INTRAMUSCULAR | Status: AC
  Administered 2021-11-08: 8 mg via INTRAVENOUS
  Filled 2021-11-08: qty 1

## 2021-11-08 MED ORDER — HEPARIN SOD (PORK) LOCK FLUSH 100 UNIT/ML IV SOLN
500.0000 [IU] | Freq: Once | INTRAVENOUS | Status: AC
  Administered 2021-11-08: 500 [IU] via INTRAVENOUS
  Filled 2021-11-08: qty 5

## 2021-11-08 MED ORDER — SODIUM CHLORIDE 0.9 % IV SOLN
INTRAVENOUS | Status: DC
  Filled 2021-11-08: qty 250

## 2021-11-08 NOTE — Progress Notes (Signed)
[place] at [facility] Telephone:([phone]) [phone] [phone]) [phone]   Name: [name] Date: 11/08/2021 MRN: [id number]  DOB: 1951/01/04  Patient Care Team: [name], PA-C as PCP - General (Family Medicine) Kate Sable, MD as PCP - Cardiology (Cardiology) Virgel Manifold, MD (Inactive) as Consulting Physician (Gastroenterology) Luetta Nutting Sharyn Dross, MD as Referring Physician (Pain Medicine) Cammie Sickle, MD as Consulting Physician (Medical Oncology) Kate Sable, MD as Consulting Physician (Cardiology) [name], MD as Consulting Physician (Colon and Rectal Surgery)    REASON FOR CONSULTATION: Goerge Collins is a 71 y.o. 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  Patient is on systemic chemotherapy with FOLFOX.  He is referred to palliative care to help address goals and manage ongoing symptoms.   SOCIAL HISTORY:     reports that he has been smoking cigarettes. He started smoking about 12 years ago. He has a 20.00 pack-year smoking history. He has never used smokeless tobacco. He reports that he does not currently use alcohol. He reports that he does not use drugs.  Patient was never married but did have a long-term girlfriend with whom he lived for a while.  He has no children.  He has a brother and an uncle who are involved.  However, patient's caregiver is his sister-in-law who lives about 30 minutes away.  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  ADVANCE DIRECTIVES:  On file  CODE STATUS:   PAST MEDICAL HISTORY: Past Medical History:  Diagnosis Date   Anemia    Asthma    Back pain    Blood transfusion without reported diagnosis     PAST SURGICAL HISTORY:  Past Surgical History:  Procedure Laterality Date   BACK SURGERY  2000   COLONOSCOPY  WITH PROPOFOL N/A 04/02/2019   Procedure: COLONOSCOPY WITH PROPOFOL;  Surgeon: Virgel Manifold, MD;  Location: [facility] ENDOSCOPY;  Service: Endoscopy;  Laterality: N/A;   COLONOSCOPY WITH PROPOFOL N/A 04/20/2020   Procedure: COLONOSCOPY WITH PROPOFOL;  Surgeon: Virgel Manifold, MD;  Location: [facility] ENDOSCOPY;  Service: Endoscopy;  Laterality: N/A;   COLONOSCOPY WITH PROPOFOL N/A 07/28/2021   Procedure: COLONOSCOPY WITH PROPOFOL;  Surgeon: Virgel Manifold, MD;  Location: [facility] ENDOSCOPY;  Service: Endoscopy;  Laterality: N/A;   ESOPHAGOGASTRODUODENOSCOPY Left 03/31/2019   Procedure: ESOPHAGOGASTRODUODENOSCOPY (EGD);  Surgeon: Virgel Manifold, MD;  Location: [facility] ENDOSCOPY;  Service: Endoscopy;  Laterality: Left;   GIVENS CAPSULE STUDY N/A 04/02/2019   Procedure: GIVENS CAPSULE STUDY;  Surgeon: Virgel Manifold, MD;  Location: [facility] ENDOSCOPY;  Service: Endoscopy;  Laterality: N/A;   IR IMAGING GUIDED PORT INSERTION  10/27/2021   XI ROBOT ASSISTED TRANSANAL RESECTION N/A 06/25/2019   Procedure: XI ROBOT ASSISTED PARTIAL PROCTECTOMY OF RECTAL MASS USING TAMIS;  Surgeon: [name], MD;  Location: [facility] ORS;  Service: General;  Laterality: N/A;    HEMATOLOGY/ONCOLOGY HISTORY:  Oncology History Overview Note  # JULy 2020- RECTAL MASS-frond-like/villous non-obstructing large mass partially circumferential (involving one half of the lumen circumference; Dr.Tahiliani]; CT C/A/P- NEGATIVE METASTATIC DISEASE; Bx- FRAGMENTS OF TUBULOVILLOUS ADENOMA WITH FOCAL HIGH-GRADE DYSPLASIA; - INVASIVE CARCINOMA NOT IDENTIFIED.   # July 2020- Severe IDA- Hb 3.5 [s/p PRBC]  SURGICAL PATHOLOGY  CASE: [id number]  PATIENT: 23 Roberto Collins  Surgical Pathology Report      Specimen Submitted:  A. Colon polyp x8, transverse; cold snare  B.  Colon polyp x2, sigmoid; cold snare  C. Rectum polyp; cbx   Clinical History: Colon cancer screening Z12.11.  History of colonic  polyps Z86.010.  Colon polyps        DIAGNOSIS:  A. COLON POLYPS X8, TRANSVERSE; COLD SNARE:  - MULTIPLE FRAGMENTS OF TUBULAR ADENOMAS.  - NEGATIVE FOR HIGH-GRADE DYSPLASIA OR MALIGNANCY.   B. COLON POLYPS X2, SIGMOID; COLD SNARE:  - MULTIPLE FRAGMENTS OF TUBULAR ADENOMAS.  - NEGATIVE FOR HIGH-GRADE DYSPLASIA AND MALIGNANCY.   C. RECTAL POLYP; COLD BIOPSY:  - SUPERFICIAL STRIPS OF TUBULOVILLOUS ADENOMA.  - DEFINITE HIGH-GRADE DYSPLASIA IS NOT IDENTIFIED.  - NEGATIVE FOR MALIGNANCY IN A LIMITED SAMPLING.   Comment:  The patient's prior history of villous adenoma with high-grade dysplasia  involving the rectum is noted.  Biopsy sections from the rectal polyp  display superficial strips of villous tissue with adenomatous changes.  Definite high-grade dysplasia and malignancy are not identified in the  current specimen.  However, evaluation is limited by the superficial  nature of the sampling.  Clinical and endoscopic correlation is  recommended.   IMPRESSION: 1. 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 2. Amorphous ill-defined hypodense masslike density at the lower rectum which may represent the known rectal cancer, limited visualization. 3. Other ancillary findings as described.     Electronically Signed   By: Ofilia Neas M.D.   On: 09/20/2021 10:41  31st, JAN 2023-  A. OMENTUM, LEFT LATERAL; CT-GUIDED CORE NEEDLE BIOPSY:  - POSITIVE FOR MALIGNANCY.  - METASTATIC ADENOCARCINOMA WITH MUCINOUS FEATURES, COMPATIBLE WITH  COLORECTAL PRIMARY.  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  MEDICATIONS:  Current Outpatient Medications  Medication Sig Dispense Refill   albuterol (VENTOLIN HFA) 108 (90 Base) MCG/ACT inhaler Inhale 2 puffs into the lungs every 6 (six) hours as needed for wheezing or shortness of breath.     aspirin EC 81 MG tablet Take 81 mg by mouth daily. Swallow whole.     chlorproMAZINE (THORAZINE) 100 MG tablet Take 300 mg by mouth at bedtime.     Cholecalciferol (VITAMIN D) 50 MCG (2000 UT) CAPS Take 2,000 Units by mouth daily.     cyclobenzaprine (FLEXERIL) 10 MG tablet Take 10 mg by mouth at bedtime.     cyclobenzaprine (FLEXERIL) 10 MG tablet Take 10 mg by mouth 3 (three) times daily as needed for muscle spasms.     dexamethasone (DECADRON) 4 MG tablet Take 2 tablets (8 mg total) by mouth daily. Start the day after chemotherapy for 2 days. Take with food. 30 tablet 1   doxepin (SINEQUAN) 25 MG capsule Take 25 mg by mouth at bedtime.     fluticasone-salmeterol (ADVAIR) 250-50 MCG/ACT AEPB Inhale 1 puff into the lungs  in the morning and at bedtime. (Patient not taking: Reported on 10/24/2021)     gabapentin (NEURONTIN) 600 MG tablet Take 1,800 mg by mouth at bedtime.      lidocaine-prilocaine (EMLA) cream Apply to affected area once 30 g 3   morphine (MS CONTIN) 60 MG 12 hr tablet Take 60 mg by mouth every 12 (twelve) hours.     nystatin (MYCOSTATIN) 100000 UNIT/ML suspension Take 5 mLs (500,000 Units total) by mouth 4 (four) times daily. 60 mL 0   ondansetron (ZOFRAN) 8 MG tablet Take 1 tablet (8 mg total) by mouth 2 (two) times daily as needed for refractory nausea / vomiting. Start on day 3 after  chemotherapy. 30 tablet 1   oxyCODONE-acetaminophen (PERCOCET/ROXICET) 5-325 MG tablet Take 1 tablet by mouth every 4 (four) hours as needed for severe pain. (Patient not taking: Reported on 10/24/2021)     prochlorperazine (COMPAZINE) 10 MG tablet Take 1 tablet (10 mg total) by mouth every 6 (six) hours as needed (Nausea or vomiting). 30 tablet 1   rOPINIRole (REQUIP) 1 MG tablet Take 1 mg by mouth at bedtime as needed. (Patient not taking: Reported on 11/01/2021)     rosuvastatin (CRESTOR) 5 MG tablet Take 1 tablet (5 mg total) by mouth at bedtime. 90 tablet 3   Sod Picosulfate-Mag Ox-Cit Acd (CLENPIQ) 10-3.5-12 MG-GM -GM/160ML SOLN Take 1 kit by mouth as directed. At 5 PM evening before procedure, drink 1 bottle of Clenpiq, hydrate, drink (5) 8 oz of water. Then do the same thing 5 hours prior to your procedure. (Patient not taking: Reported on 10/24/2021) 320 mL 0   No current facility-administered medications for this visit.   Facility-Administered Medications Ordered in Other Visits  Medication Dose Route Frequency Provider Last Rate Last Admin   0.9 %  sodium chloride infusion   Intravenous Continuous Louella Medaglia, Kirt Boys, NP 20 mL/hr at 11/08/21 0839 New Bag at 11/08/21 0839    VITAL SIGNS: There were no vitals taken for this visit. There were no vitals filed for this visit.  Estimated body mass index is 17.53 kg/m as calculated from the following:   Height as of 11/01/21: $RemoveBe'5\' 10"'SFkpaTkci$  (1.778 m).   Weight as of 11/01/21: 122 lb 3.2 oz (55.4 kg).  LABS: CBC:    Component Value Date/Time   WBC 7.2 11/08/2021 0818   HGB 10.8 (L) 11/08/2021 0818   HCT 32.7 (L) 11/08/2021 0818   PLT 166 11/08/2021 0818   MCV 86.7 11/08/2021 0818   NEUTROABS 6.2 11/08/2021 0818   LYMPHSABS 0.5 (L) 11/08/2021 0818   MONOABS 0.3 11/08/2021 0818   EOSABS 0.1 11/08/2021 0818   BASOSABS 0.0 11/08/2021 0818   Comprehensive Metabolic Panel:    Component Value Date/Time   NA 135 11/08/2021 0818   K 3.5 11/08/2021  0818   CL 101 11/08/2021 0818   CO2 26 11/08/2021 0818   BUN 25 (H) 11/08/2021 0818   CREATININE 0.64 11/08/2021 0818   CREATININE 0.78 10/24/2021 1427   GLUCOSE 109 (H) 11/08/2021 0818   CALCIUM 8.2 (L) 11/08/2021 0818   AST 24 11/08/2021 0818   ALT 18 11/08/2021 0818   ALKPHOS 64 11/08/2021 0818   BILITOT 0.3 11/08/2021 0818   PROT 6.1 (L) 11/08/2021 0818   ALBUMIN 2.8 (L) 11/08/2021 0818    RADIOGRAPHIC STUDIES: NM PET Image Initial (PI) Skull Base To Thigh (F-18 FDG)  Result Date: 10/13/2021 CLINICAL DATA:  Initial treatment strategy for rectal cancer. Peritoneal carcinomatosis. EXAM: NUCLEAR  MEDICINE PET SKULL BASE TO THIGH TECHNIQUE: 6.8 mCi F-18 FDG was injected intravenously. Full-ring PET imaging was performed from the skull base to thigh after the radiotracer. CT data was obtained and used for attenuation correction and anatomic localization. Fasting blood glucose: 104 mg/dl COMPARISON:  CT scan 09/19/2021 FINDINGS: Mediastinal blood pool activity: SUV max 1.95 Liver activity: SUV max NA NECK: Large area hypermetabolism associated with the right aspect of the tongue worrisome for neoplasm. SUV max is 9.72. Difficult to see a discrete mass on the CT scan. Recommend correlation with direct visualization. No associated enlarged or hypermetabolic neck nodes. Incidental CT findings: Bilateral carotid artery calcifications. CHEST: No hypermetabolic mediastinal or hilar nodes. No suspicious pulmonary nodules on the CT scan. Incidental CT findings: Advanced emphysematous changes and areas of pulmonary scarring. Scattered atherosclerotic calcifications involving the aorta and coronary arteries. Small bilateral pleural effusions. ABDOMEN/PELVIS: Diffuse and extensive peritoneal carcinomatosis with extensive soft tissue nodularity throughout the abdomen and pelvis which demonstrates hypermetabolism. SUV max ranges between 3.38 and 4.20. No discrete hypermetabolic rectal mass is identified. No other  colonic lesions are identified. No findings to suggest hepatic metastatic disease. Incidental CT findings: Age advanced atherosclerotic calcifications involving the aorta and iliac arteries but no aneurysm. SKELETON: No PET-CT findings to suggest skeletal metastasis. Incidental CT findings: Surgical changes involving the lumbar spine with lumbar fusion hardware. IMPRESSION: 1. Diffuse peritoneal carcinomatosis and associated large volume abdominal/pelvic ascites. 2. No discrete hypermetabolic rectal or colonic mass is identified. 3. No findings for hepatic metastatic disease or metastatic disease involving the chest or bony structures. 4. Large area of hypermetabolism involving the right aspect of the tongue without discrete mass seen on CT. Findings worrisome for a tongue cancer. Recommend correlation with direct visualization. No associated hypermetabolic neck adenopathy. 5. Emphysematous changes and pulmonary scarring. 6. Age advanced vascular disease. Electronically Signed   By: Marijo Sanes M.D.   On: 10/13/2021 08:42   CT BIOPSY  Result Date: 10/27/2021 CLINICAL DATA:  Peritoneal carcinomatosis and ascites of unknown primary with prior paracentesis demonstrating malignant cells in ascites. Material was not sufficient for additional molecular analysis and core biopsy has been requested for further tissue analysis. EXAM: CT GUIDED CORE BIOPSY OF PERITONEAL/OMENTAL TUMOR ANESTHESIA/SEDATION: Moderate (conscious) sedation was employed during this procedure. A total of Versed 1.0 mg and Fentanyl 50 mcg was administered intravenously by radiology nursing. Moderate Sedation Time: 12 minutes. The patient's level of consciousness and vital signs were monitored continuously by radiology nursing throughout the procedure under my direct supervision. PROCEDURE: The procedure risks, benefits, and alternatives were explained to the patient. Questions regarding the procedure were encouraged and answered. The patient  understands and consents to the procedure. A time-out was performed prior to initiating the procedure. CT was performed through the abdomen and pelvis. The left lower abdominal wall was prepped with chlorhexidine in a sterile fashion, and a sterile drape was applied covering the operative field. A sterile gown and sterile gloves were used for the procedure. Local anesthesia was provided with 1% Lidocaine. Under CT guidance, a 17 gauge trocar needle was advanced from a left anterior approach into the left peritoneal cavity. After confirming needle tip position, coaxial 18 gauge core biopsy samples were obtained. Four separate core biopsy samples were obtained and submitted in formalin. COMPLICATIONS: None FINDINGS: A region of peritoneal tumor demonstrating increased metabolic activity by PET scan in the left lateral lower peritoneal cavity was targeted. Solid, mucinous appearing tumor was obtained. IMPRESSION: CT-guided core biopsy performed peritoneal/omental  tumor in the left lateral lower peritoneal cavity. Solid tissue was obtained. RADIATION DOSE REDUCTION: This exam was performed according to the departmental dose-optimization program which includes automated exposure control, adjustment of the mA and/or kV according to patient size and/or use of iterative reconstruction technique. Electronically Signed   By: Aletta Edouard M.D.   On: 10/27/2021 13:34   [place] ASCITES (ABDOMEN LIMITED)  Result Date: 11/03/2021 CLINICAL DATA:  Ascites EXAM: LIMITED ABDOMEN ULTRASOUND FOR ASCITES TECHNIQUE: Limited ultrasound survey for ascites was performed in all four abdominal quadrants. COMPARISON:  None. FINDINGS: Limited sonographic exam of the abdomen was performed. Images demonstrate small volume ascites, primarily in the right upper quadrant, with very little ascites in the lower quadrants for safe image guided paracentesis. No paracentesis performed. IMPRESSION: Small volume ascites.  No paracentesis performed.  Electronically Signed   By: Albin Felling M.D.   On: 11/03/2021 15:16   IR IMAGING GUIDED PORT INSERTION  Result Date: 10/27/2021 CLINICAL DATA:  Peritoneal carcinomatosis and need for porta cath for chemotherapy. EXAM: IMPLANTED PORT A CATH PLACEMENT WITH ULTRASOUND AND FLUOROSCOPIC GUIDANCE ANESTHESIA/SEDATION: Moderate (conscious) sedation was employed during this procedure. A total of Versed 1.0 mg and Fentanyl 50 mcg was administered intravenously by radiology nursing. Moderate Sedation Time: 23 minutes. The patient's level of consciousness and vital signs were monitored continuously by radiology nursing throughout the procedure under my direct supervision. FLUOROSCOPY TIME:  42 seconds.  3.3 mGy. PROCEDURE: The procedure, risks, benefits, and alternatives were explained to the patient. Questions regarding the procedure were encouraged and answered. The patient understands and consents to the procedure. A time-out was performed prior to initiating the procedure. Ultrasound was utilized to confirm patency of the right internal jugular vein. A permanent ultrasound image was recorded. The right neck and chest were prepped with chlorhexidine in a sterile fashion, and a sterile drape was applied covering the operative field. Maximum barrier sterile technique with sterile gowns and gloves were used for the procedure. Local anesthesia was provided with 1% lidocaine. After creating a small venotomy incision, a 21 gauge needle was advanced into the right internal jugular vein under direct, real-time ultrasound guidance. Ultrasound image documentation was performed. After securing guidewire access, an 8 Fr dilator was placed. A J-wire was kinked to measure appropriate catheter length. A subcutaneous port pocket was then created along the upper chest wall utilizing sharp and blunt dissection. Portable cautery was utilized. The pocket was irrigated with sterile saline. A single lumen power injectable port was chosen  for placement. The 8 Fr catheter was tunneled from the port pocket site to the venotomy incision. The port was placed in the pocket. External catheter was trimmed to appropriate length based on guidewire measurement. At the venotomy, an 8 Fr peel-away sheath was placed over a guidewire. The catheter was then placed through the sheath and the sheath removed. Final catheter positioning was confirmed and documented with a fluoroscopic spot image. The port was accessed with a needle and aspirated and flushed with heparinized saline. The access needle was removed. The venotomy and port pocket incisions were closed with subcutaneous 3-0 Monocryl and subcuticular 4-0 Vicryl. Dermabond was applied to both incisions. COMPLICATIONS: COMPLICATIONS None FINDINGS: After catheter placement, the tip lies at the cavo-atrial junction. The catheter aspirates normally and is ready for immediate use. IMPRESSION: Placement of single lumen port a cath via right internal jugular vein. The catheter tip lies at the cavo-atrial junction. A power injectable port a cath was placed and is ready  for immediate use. Electronically Signed   By: Aletta Edouard M.D.   On: 10/27/2021 13:35    PERFORMANCE STATUS (ECOG) : 3 - Symptomatic, >50% confined to bed  Review of Systems Unless otherwise noted, a complete review of systems is negative.  Physical Exam General: NAD Pulmonary: Unlabored Extremities: no edema, no joint deformities Skin: no rashes Neurological: Weakness but otherwise nonfocal  IMPRESSION: I met with patient and sister-in-law today in clinic.  Introduced palliative care services and attempted to establish therapeutic rapport.  Patient reports that he has "fed up" today but says that he awoke in a poor mood.  He clarifies that his goals are still aligned with continued treatment.  He has good days and bad.  He remains at home with poor performance status.  His sister-in-law is his primary caregiver but she lives about  30 minutes away.  I have referred to home health and the RN is going to see patient this afternoon.  We will also send referral for rehab screening here in our clinic.  He currently denies any severe symptomatic complaints, although he does endorse some esophageal/mouth discomfort and poor appetite.  He has had reflux symptoms.  He was prescribed nystatin mouthwash but has not yet started this.  I do not see any evidence of oral thrush.  Suspect mucositis.  Okay to start nystatin swish/swallow.  Also recommended baking soda/salt rinses.  If no improvement, would rotate to Magic mouthwash with lidocaine.  We will plan to start PPI.  Patient denies depression or anxiety but is interested in speaking with someone regarding his feelings.  Recommended referral to psychology but patient has transportation issues and is unable to do virtual visits.  He has been referred to LCSW.  Endorses insomnia with difficulty both initiating and maintaining sleep.  He has a history of taking Thorazine and more recently was prescribed doxepin by his pain physician.  He does not feel like these have been effective.  There would be reasonable to trial rotating to mirtazapine versus trazodone.  However, will discuss with pain physician as patient is also requesting that we take over management of his pain medications.  Patient asked about discontinuing his Crestor.  He meets next week with his PCP to discuss medications.  It would be reasonable to discontinue statin given his poor prognosis from the cancer.  ACP documents on file.  His sister-in-law is his healthcare power of attorney.  I sent patient home with a MOST form to review.  Patient says that he does not think that he would want to be resuscitated nor have his life prolonged artificially on machines.  He would be okay with short-term hospitalization to treat the treatable.  PLAN: -Continue current scope of treatment -Start pantoprazole 20 mg daily -May try nystatin  and baking soda/salt rinses -Referrals pending for home health, LCSW, nutrition, rehab screening -We will reach out to a pain physician (Dr. Holland Falling - [facility]) regarding taking over prescribing of MS Contin, muscle relaxer, etc. -MOST form reviewed -Follow-up telephone visit 1 month    Patient expressed understanding and was in agreement with this plan. He also understands that He can call the clinic at any time with any questions, concerns, or complaints.     Time Total: 30 minutes  Visit consisted of counseling and education dealing with the complex and emotionally intense issues of symptom management and palliative care in the setting of serious and potentially life-threatening illness.Greater than 50%  of this time was spent counseling  and coordinating care related to the above assessment and plan.  Signed by: Altha Harm, PhD, NP-C

## 2021-11-08 NOTE — Progress Notes (Signed)
Pt here for IV fluids. Reports that he feels weak and has had a poor appetite. Denies nausea, vomiting, or diarrhea. Also reports throat pain when swallowing. Per Dr. Rogue Bussing, pt to receive 8mg  dexamethasone IV.

## 2021-11-08 NOTE — Progress Notes (Signed)
Nutrition Assessment   Reason for Assessment:  Referral from Dr B for weight loss.     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  Past medical history of anemia, asthma.  Patient receiving folfox.    Met with patient and sister in law, Anne Ng in clinic.  Patient reports that his appetite has been decreased for at least 2 months or more prior to starting treatment. Patient lives alone.  Patient says that he sometimes will eat gravy biscuit or cereal for breakfast.  Will usually eat again around 3-4pm but yesterday did not eat anything. Said he popped some popcorn but burned it and did not try to fix anything else.  Anne Ng says that patient is weak and can't prepare meals.  Has trouble walking from living room to kitchen. She is planning on getting him a shelf to put by chair to keep snacks on and buy frozen meals.  Has tried the oral nutrition supplements.  Has trouble affording them.  Patient reports reflux and nausea at times (none in clinic).  Feels full quickly.  Having some issues with throat pain.  Says that he is having regular bowel movement each morning.      Nutrition Focused Physical Exam:   Limited exam due to patient fully clothed.    Orbital: severe Cheek region: severe Hand region: severe Temple: severe Thigh: severe Calf: severe    Medications: zofran, compazine, Vit D nystatin  Labs: glucose 109, BUN 25, albumin 2.8   Anthropometrics:   Height: 70 inches Weight: 122 lb 3.2 oz on 2/1 127 lb on 02/08/21 BMI: 17  4% weight loss in the last 9 months   Estimated Energy Needs  Kcals: 6962-9528 Protein: 82-96 g Fluid: 1650 - 1925   NUTRITION DIAGNOSIS: Inadequate oral intake related to cancer, weakness, inability to prepare meals, abdominal fullness as evidenced by severe muscle and fat wasting.     MALNUTRITION DIAGNOSIS: Patient  meets criteria for severe malnutrition in context of chronic illness as evidenced by severe muscle and fat mass loss.     INTERVENTION:  Discussed ways to add calories and protein in diet.  Handout provided Encouraged nibbles q 1-2 hours Samples of ensure complete and orgain given to patient along with coupons.   OT evaluating patient today to help improve strength Discussed easy to prepare meals/snacks Contact information given   MONITORING, EVALUATION, GOAL: weight trends, intake   Next Visit: Wednesday, Feb 15 during infusion  Athleen Feltner B. Zenia Resides, [place], [place] Registered Dietitian [phone] (mobile)

## 2021-11-08 NOTE — Therapy (Signed)
[facility] at [facility], [place], [place], [address] Phone: [phone]   Fax:  [phone]  Occupational Therapy Screen:  Patient Details  Name: Roberto Collins MRN: [id number] Date of Birth: Apr 10, 1951 No data recorded  Encounter Date: 11/08/2021   OT End of Session - 11/08/21 1340     Visit Number 0             Past Medical History:  Diagnosis Date   Anemia    Asthma    Back pain    Blood transfusion without reported diagnosis     Past Surgical History:  Procedure Laterality Date   BACK SURGERY  2000   COLONOSCOPY WITH PROPOFOL N/A 04/02/2019   Procedure: COLONOSCOPY WITH PROPOFOL;  Surgeon: Virgel Manifold, MD;  Location: [facility] ENDOSCOPY;  Service: Endoscopy;  Laterality: N/A;   COLONOSCOPY WITH PROPOFOL N/A 04/20/2020   Procedure: COLONOSCOPY WITH PROPOFOL;  Surgeon: Virgel Manifold, MD;  Location: [facility] ENDOSCOPY;  Service: Endoscopy;  Laterality: N/A;   COLONOSCOPY WITH PROPOFOL N/A 07/28/2021   Procedure: COLONOSCOPY WITH PROPOFOL;  Surgeon: Virgel Manifold, MD;  Location: [facility] ENDOSCOPY;  Service: Endoscopy;  Laterality: N/A;   ESOPHAGOGASTRODUODENOSCOPY Left 03/31/2019   Procedure: ESOPHAGOGASTRODUODENOSCOPY (EGD);  Surgeon: Virgel Manifold, MD;  Location: [facility] ENDOSCOPY;  Service: Endoscopy;  Laterality: Left;   GIVENS CAPSULE STUDY N/A 04/02/2019   Procedure: GIVENS CAPSULE STUDY;  Surgeon: Virgel Manifold, MD;  Location: [facility] ENDOSCOPY;  Service: Endoscopy;  Laterality: N/A;   IR IMAGING GUIDED PORT INSERTION  10/27/2021   XI ROBOT ASSISTED TRANSANAL RESECTION N/A 06/25/2019   Procedure: XI ROBOT ASSISTED PARTIAL PROCTECTOMY OF RECTAL MASS USING TAMIS;  Surgeon: [name], MD;  Location: [facility] ORS;  Service: General;  Laterality: N/A;    There were no vitals filed for this visit.   Subjective Assessment - 11/08/21 1339     Subjective  I did not had any falls the last 3-6 months ,  HH nursing is coming today - I sit most of the time - I feel better as the day goes after I get up    Currently in Pain? No/denies                  IMPRESSION PALLIATIVE CARE J BORDERS  11/08/21: I met with patient and sister-in-law today in clinic.  Introduced palliative care services and attempted to establish therapeutic rapport.   Patient reports that he has "fed up" today but says that he awoke in a poor mood.  He clarifies that his goals are still aligned with continued treatment.  He has good days and bad.  He remains at home with poor performance status.  His sister-in-law is his primary caregiver but she lives about 30 minutes away.  I have referred to home health and the RN is going to see patient this afternoon.  We will also send referral for rehab screening here in our clinic.   He currently denies any severe symptomatic complaints, although he does endorse some esophageal/mouth discomfort and poor appetite.  He has had reflux symptoms.  He was prescribed nystatin mouthwash but has not yet started this.  I do not see any evidence of oral thrush.  Suspect mucositis.  Okay to start nystatin swish/swallow.  Also recommended baking soda/salt rinses.  If no improvement, would rotate to Magic mouthwash with lidocaine.  We will plan to start PPI.   Patient denies depression or anxiety but is interested in  speaking with someone regarding his feelings.  Recommended referral to psychology but patient has transportation issues and is unable to do virtual visits.  He has been referred to LCSW.   Endorses insomnia with difficulty both initiating and maintaining sleep.  He has a history of taking Thorazine and more recently was prescribed doxepin by his pain physician.  He does not feel like these have been effective.  There would be reasonable to trial rotating to mirtazapine versus trazodone.  However, will discuss with pain physician as patient is also requesting that we take over management  of his pain medications.   Patient asked about discontinuing his Crestor.  He meets next week with his PCP to discuss medications.  It would be reasonable to discontinue statin given his poor prognosis from the cancer.   ACP documents on file.  His sister-in-law is his healthcare power of attorney.  I sent patient home with a MOST form to review.  Patient says that he does not think that he would want to be resuscitated nor have his life prolonged artificially on machines.  He would be okay with short-term hospitalization to treat the treatable.   PLAN: -Continue current scope of treatment -Start pantoprazole 20 mg daily -May try nystatin and baking soda/salt rinses -Referrals pending for home health, LCSW, nutrition, rehab screening -We will reach out to a pain physician (Dr. Holland Falling - [facility]) regarding taking over prescribing of MS Contin, muscle relaxer, etc. -MOST form reviewed -Follow-up telephone visit 1 month   OT SCREEN 11/08/21: Pt and S-I-L seen by OT per referral by Palliative care. Pt report [facility] nursing coming out this afternoon. Pt denies any falls. Report he sits most of the time in chair- had years ago fusion of lower back.  Tub-shower combo - but very small bathroom -pt to discuss with [facility] therapy safety and shower setup at home BERG balance test done this date - pt score 51/56 with low risk for falling  Pt weaker in R LE - for standing on one leg, placing in tandem- do need his hands to push up from thighs Pt provided some education on doing HEP at home -recommend breaking up in small periods of 5-7 min 4 x day  HEP until [facility] PT /OT gets out to him: -sit<> stand from chair without hands 10 reps -walking 4 x day for 1-2 min  -side stepping along counter or wall 1-2 min  -heel raises 10 reps  Pt to follow up with me as needed                                          Visit Diagnosis: Muscle weakness (generalized)    Problem  List Patient Active Problem List   Diagnosis Date Noted   Peritoneal carcinomatosis ([place]) 10/03/2021   Candidate for statin therapy due to risk of future cardiovascular event 05/11/2021   Coronary artery disease involving native heart without angina pectoris 05/11/2021   Atherosclerosis 05/11/2021   Pulmonary emphysema ([place]) 05/11/2021   Current every day smoker 05/11/2021   Smoking greater than 30 pack years 05/11/2021   History of colonic polyps    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 06/25/2019 06/25/2019   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[place]) 04/10/2019   Iron deficiency anemia due to chronic blood loss 04/10/2019   Polyp of colon    Colon neoplasm  Diverticulosis of large intestine without diverticulitis    Chronic insomnia 04/04/2012   Chronic low back pain 04/04/2012   Depression 04/04/2012    Rosalyn Gess, OTR/L,CLT 11/08/2021, 1:41 PM  [facility] at [facility] [address] [place], [place], [address] Phone: [phone]   Fax:  [phone]  Name: Niklas Chretien MRN: [id number] Date of Birth: 07-04-1951

## 2021-11-09 ENCOUNTER — Other Ambulatory Visit: Payer: Medicare PPO

## 2021-11-09 NOTE — Progress Notes (Signed)
Tumor Board Documentation  Roberto Collins was presented by Dr Rogue Bussing at our Tumor Board on 11/09/2021, which included representatives from medical oncology, surgical, pharmacy, pulmonology, genetics, radiology, pathology, nutrition, research, navigation, radiation oncology, internal medicine, palliative care.  Roberto Collins currently presents as a current patient, for Roberto Collins, for new positive pathology with history of the following treatments: active survellience, surgical intervention(s).  Additionally, we reviewed previous medical and familial history, history of present illness, and recent lab results along with all available histopathologic and imaging studies. The tumor board considered available treatment options and made the following recommendations: Chemotherapy (FOLFOX)    The following procedures/referrals were also placed: No orders of the defined types were placed in this encounter.   Clinical Trial Status: not discussed   Staging used: AJCC Stage Group AJCC Staging: T: X N: X M: 1c Group: Stage IV C Adenocarcinoma Peritoneal Carcinomatosis   National site-specific guidelines NCCN were discussed with respect to the case.  Tumor board is a meeting of clinicians from various specialty areas who evaluate and discuss patients for whom a multidisciplinary approach is being considered. Final determinations in the plan of care are those of the provider(s). The responsibility for follow up of recommendations given during tumor board is that of the provider.   Todays extended care, comprehensive team conference, Roberto Collins was not present for the discussion and was not examined.   Multidisciplinary Tumor Board is a multidisciplinary case peer review process.  Decisions discussed in the Multidisciplinary Tumor Board reflect the opinions of the specialists present at the conference without having examined the patient.  Ultimately, treatment and diagnostic decisions rest with the primary  provider(s) and the patient.

## 2021-11-10 ENCOUNTER — Encounter: Payer: Self-pay | Admitting: Internal Medicine

## 2021-11-10 ENCOUNTER — Observation Stay: Payer: Medicare PPO

## 2021-11-10 ENCOUNTER — Telehealth: Payer: Self-pay

## 2021-11-10 ENCOUNTER — Encounter: Payer: Self-pay | Admitting: Licensed Clinical Social Worker

## 2021-11-10 ENCOUNTER — Other Ambulatory Visit: Payer: Self-pay

## 2021-11-10 ENCOUNTER — Inpatient Hospital Stay
Admission: EM | Admit: 2021-11-10 | Discharge: 2021-11-17 | DRG: 177 | Disposition: A | Discharge status: Skilled Nursing Facility | Payer: Medicare PPO | Attending: Internal Medicine | Admitting: Internal Medicine

## 2021-11-10 ENCOUNTER — Emergency Department: Payer: Medicare PPO

## 2021-11-10 DIAGNOSIS — J189 Pneumonia, unspecified organism: Secondary | ICD-10-CM | POA: Diagnosis present

## 2021-11-10 DIAGNOSIS — F32A Depression, unspecified: Secondary | ICD-10-CM | POA: Diagnosis present

## 2021-11-10 DIAGNOSIS — G8929 Other chronic pain: Secondary | ICD-10-CM | POA: Diagnosis present

## 2021-11-10 DIAGNOSIS — R64 Cachexia: Secondary | ICD-10-CM | POA: Diagnosis present

## 2021-11-10 DIAGNOSIS — Z66 Do not resuscitate: Secondary | ICD-10-CM | POA: Diagnosis present

## 2021-11-10 DIAGNOSIS — J90 Pleural effusion, not elsewhere classified: Secondary | ICD-10-CM | POA: Diagnosis present

## 2021-11-10 DIAGNOSIS — R112 Nausea with vomiting, unspecified: Secondary | ICD-10-CM

## 2021-11-10 DIAGNOSIS — Z681 Body mass index (BMI) 19 or less, adult: Secondary | ICD-10-CM

## 2021-11-10 DIAGNOSIS — D6959 Other secondary thrombocytopenia: Secondary | ICD-10-CM | POA: Diagnosis present

## 2021-11-10 DIAGNOSIS — Y92009 Unspecified place in unspecified non-institutional (private) residence as the place of occurrence of the external cause: Secondary | ICD-10-CM

## 2021-11-10 DIAGNOSIS — Z515 Encounter for palliative care: Secondary | ICD-10-CM

## 2021-11-10 DIAGNOSIS — R531 Weakness: Secondary | ICD-10-CM

## 2021-11-10 DIAGNOSIS — R180 Malignant ascites: Secondary | ICD-10-CM | POA: Diagnosis present

## 2021-11-10 DIAGNOSIS — F17210 Nicotine dependence, cigarettes, uncomplicated: Secondary | ICD-10-CM | POA: Diagnosis present

## 2021-11-10 DIAGNOSIS — E785 Hyperlipidemia, unspecified: Secondary | ICD-10-CM | POA: Diagnosis present

## 2021-11-10 DIAGNOSIS — R140 Abdominal distension (gaseous): Secondary | ICD-10-CM

## 2021-11-10 DIAGNOSIS — T148XXA Other injury of unspecified body region, initial encounter: Secondary | ICD-10-CM | POA: Diagnosis present

## 2021-11-10 DIAGNOSIS — W57XXXA Bitten or stung by nonvenomous insect and other nonvenomous arthropods, initial encounter: Secondary | ICD-10-CM | POA: Diagnosis present

## 2021-11-10 DIAGNOSIS — D611 Drug-induced aplastic anemia: Secondary | ICD-10-CM | POA: Diagnosis present

## 2021-11-10 DIAGNOSIS — D61810 Antineoplastic chemotherapy induced pancytopenia: Secondary | ICD-10-CM | POA: Diagnosis present

## 2021-11-10 DIAGNOSIS — E43 Unspecified severe protein-calorie malnutrition: Secondary | ICD-10-CM | POA: Diagnosis present

## 2021-11-10 DIAGNOSIS — Z800 Family history of malignant neoplasm of digestive organs: Secondary | ICD-10-CM

## 2021-11-10 DIAGNOSIS — T451X5A Adverse effect of antineoplastic and immunosuppressive drugs, initial encounter: Secondary | ICD-10-CM | POA: Diagnosis present

## 2021-11-10 DIAGNOSIS — R188 Other ascites: Secondary | ICD-10-CM

## 2021-11-10 DIAGNOSIS — J690 Pneumonitis due to inhalation of food and vomit: Principal | ICD-10-CM | POA: Diagnosis present

## 2021-11-10 DIAGNOSIS — C786 Secondary malignant neoplasm of retroperitoneum and peritoneum: Secondary | ICD-10-CM | POA: Diagnosis present

## 2021-11-10 DIAGNOSIS — E860 Dehydration: Secondary | ICD-10-CM | POA: Diagnosis present

## 2021-11-10 DIAGNOSIS — M549 Dorsalgia, unspecified: Secondary | ICD-10-CM | POA: Diagnosis present

## 2021-11-10 DIAGNOSIS — Z20822 Contact with and (suspected) exposure to covid-19: Secondary | ICD-10-CM | POA: Diagnosis present

## 2021-11-10 DIAGNOSIS — J449 Chronic obstructive pulmonary disease, unspecified: Secondary | ICD-10-CM

## 2021-11-10 DIAGNOSIS — R52 Pain, unspecified: Secondary | ICD-10-CM

## 2021-11-10 DIAGNOSIS — E876 Hypokalemia: Secondary | ICD-10-CM | POA: Diagnosis present

## 2021-11-10 DIAGNOSIS — Z86010 Personal history of colonic polyps: Secondary | ICD-10-CM

## 2021-11-10 DIAGNOSIS — J439 Emphysema, unspecified: Secondary | ICD-10-CM | POA: Diagnosis present

## 2021-11-10 DIAGNOSIS — Z85048 Personal history of other malignant neoplasm of rectum, rectosigmoid junction, and anus: Secondary | ICD-10-CM

## 2021-11-10 LAB — CBC
HCT: 31 % — ABNORMAL LOW (ref 39.0–52.0)
HCT: 28.1 % — ABNORMAL LOW (ref 39.0–52.0)
Hemoglobin: 10.3 g/dL — ABNORMAL LOW (ref 13.0–17.0)
Hemoglobin: 9.5 g/dL — ABNORMAL LOW (ref 13.0–17.0)
MCH: 29.2 pg (ref 26.0–34.0)
MCH: 29.4 pg (ref 26.0–34.0)
MCHC: 33.2 g/dL (ref 30.0–36.0)
MCHC: 33.8 g/dL (ref 30.0–36.0)
MCV: 87.8 fL (ref 80.0–100.0)
MCV: 87 fL (ref 80.0–100.0)
Platelets: 170 10*3/uL (ref 150–400)
Platelets: 149 10*3/uL — ABNORMAL LOW (ref 150–400)
RBC: 3.53 MIL/uL — ABNORMAL LOW (ref 4.22–5.81)
RBC: 3.23 MIL/uL — ABNORMAL LOW (ref 4.22–5.81)
RDW: 14.6 % (ref 11.5–15.5)
RDW: 14.6 % (ref 11.5–15.5)
WBC: 5.9 10*3/uL (ref 4.0–10.5)
WBC: 4.7 10*3/uL (ref 4.0–10.5)
nRBC: 0 % (ref 0.0–0.2)
nRBC: 0 % (ref 0.0–0.2)

## 2021-11-10 LAB — COMPREHENSIVE METABOLIC PANEL
ALT: 16 U/L (ref 0–44)
AST: 19 U/L (ref 15–41)
Albumin: 2.6 g/dL — ABNORMAL LOW (ref 3.5–5.0)
Alkaline Phosphatase: 58 U/L (ref 38–126)
Anion gap: 13 (ref 5–15)
BUN: 20 mg/dL (ref 8–23)
CO2: 22 mmol/L (ref 22–32)
Calcium: 7.9 mg/dL — ABNORMAL LOW (ref 8.9–10.3)
Chloride: 102 mmol/L (ref 98–111)
Creatinine, Ser: 0.32 mg/dL — ABNORMAL LOW (ref 0.61–1.24)
GFR, Estimated: >60 mL/min (ref >60)
Glucose, Bld: 96 mg/dL (ref 70–99)
Potassium: 3.4 mmol/L — ABNORMAL LOW (ref 3.5–5.1)
Sodium: 137 mmol/L (ref 135–145)
Total Bilirubin: 0.4 mg/dL (ref 0.3–1.2)
Total Protein: 5.7 g/dL — ABNORMAL LOW (ref 6.5–8.1)

## 2021-11-10 LAB — TROPONIN I (HIGH SENSITIVITY)
Troponin I (High Sensitivity): 8 ng/L (ref <18)
Troponin I (High Sensitivity): 8 ng/L (ref <18)

## 2021-11-10 LAB — LACTIC ACID, PLASMA: Lactic Acid, Venous: 0.7 mmol/L (ref 0.5–1.9)

## 2021-11-10 LAB — RESP PANEL BY RT-PCR (FLU A&B, COVID) ARPGX2
Influenza A by PCR: NEGATIVE
Influenza B by PCR: NEGATIVE
SARS Coronavirus 2 by RT PCR: NEGATIVE

## 2021-11-10 LAB — CULTURE, BLOOD (ROUTINE X 2)

## 2021-11-10 LAB — CREATININE, SERUM
Creatinine, Ser: 0.39 mg/dL — ABNORMAL LOW (ref 0.61–1.24)
GFR, Estimated: >60 mL/min (ref >60)

## 2021-11-10 LAB — HIV ANTIBODY (ROUTINE TESTING W REFLEX): HIV Screen 4th Generation wRfx: NONREACTIVE

## 2021-11-10 MED ORDER — ACETAMINOPHEN 650 MG RE SUPP: 650.0000 mg | Freq: Four times a day (QID) | RECTAL | Status: DC | PRN

## 2021-11-10 MED ORDER — LEVOFLOXACIN IN D5W 750 MG/150ML IV SOLN
750.0000 mg | INTRAVENOUS | Status: AC
  Administered 2021-11-11 – 2021-11-14 (×4): 750 mg via INTRAVENOUS
  Filled 2021-11-10 (×4): qty 150

## 2021-11-10 MED ORDER — SODIUM CHLORIDE 0.9 % IV SOLN
1000.0000 mL | Freq: Once | INTRAVENOUS | Status: AC
Start: 2021-11-10 — End: 2021-11-10
  Administered 2021-11-10: 1000 mL via INTRAVENOUS

## 2021-11-10 MED ORDER — DOXEPIN HCL 25 MG PO CAPS
25.0000 mg | ORAL_CAPSULE | Freq: Every day | ORAL | Status: DC
  Administered 2021-11-10 – 2021-11-16 (×6): 25 mg via ORAL
  Filled 2021-11-10 (×10): qty 1

## 2021-11-10 MED ORDER — GABAPENTIN 600 MG PO TABS
1800.0000 mg | ORAL_TABLET | Freq: Every day | ORAL | Status: DC
  Administered 2021-11-10 – 2021-11-16 (×6): 1800 mg via ORAL
  Filled 2021-11-10 (×7): qty 3

## 2021-11-10 MED ORDER — ALBUTEROL SULFATE HFA 108 (90 BASE) MCG/ACT IN AERS: 2.0000 | INHALATION_SPRAY | Freq: Four times a day (QID) | RESPIRATORY_TRACT | Status: DC | PRN

## 2021-11-10 MED ORDER — ENOXAPARIN SODIUM 40 MG/0.4ML IJ SOSY
40.0000 mg | PREFILLED_SYRINGE | INTRAMUSCULAR | Status: DC
  Administered 2021-11-10 – 2021-11-16 (×4): 40 mg via SUBCUTANEOUS
  Filled 2021-11-10 (×7): qty 0.4

## 2021-11-10 MED ORDER — ACETAMINOPHEN 325 MG PO TABS
650.0000 mg | ORAL_TABLET | Freq: Four times a day (QID) | ORAL | Status: DC | PRN
  Administered 2021-11-13 – 2021-11-14 (×2): 650 mg via ORAL
  Filled 2021-11-10 (×2): qty 2

## 2021-11-10 MED ORDER — DEXTROSE IN LACTATED RINGERS 5 % IV SOLN: INTRAVENOUS | Status: AC

## 2021-11-10 MED ORDER — LEVOFLOXACIN IN D5W 750 MG/150ML IV SOLN
750.0000 mg | Freq: Once | INTRAVENOUS | Status: AC
Start: 2021-11-10 — End: 2021-11-10
  Administered 2021-11-10: 750 mg via INTRAVENOUS
  Filled 2021-11-10: qty 150

## 2021-11-10 MED ORDER — MORPHINE SULFATE 15 MG PO TABS
60.0000 mg | ORAL_TABLET | Freq: Once | ORAL | Status: AC
  Administered 2021-11-10: 60 mg via ORAL
  Filled 2021-11-10: qty 4

## 2021-11-10 MED ORDER — ALBUTEROL SULFATE (2.5 MG/3ML) 0.083% IN NEBU: 2.5000 mg | INHALATION_SOLUTION | RESPIRATORY_TRACT | Status: DC | PRN

## 2021-11-10 MED ORDER — IOHEXOL 300 MG/ML  SOLN
100.0000 mL | Freq: Once | INTRAMUSCULAR | Status: AC | PRN
  Administered 2021-11-10: 75 mL via INTRAVENOUS
  Filled 2021-11-10: qty 100

## 2021-11-10 MED ORDER — MORPHINE SULFATE ER 30 MG PO TBCR
60.0000 mg | EXTENDED_RELEASE_TABLET | Freq: Two times a day (BID) | ORAL | Status: DC
  Administered 2021-11-10 – 2021-11-17 (×13): 60 mg via ORAL
  Filled 2021-11-10 (×14): qty 2

## 2021-11-10 NOTE — ED Notes (Signed)
First Nurse Note:  Pt to ED via [facility] from home for generalized weakness. Pt reported that he was not able to move. Per EMS pt was ambulatory upon their arrival. Pt has hx/o Stage 4 stomach cancer. Pt unsure when his last treatment was. Pt is in NAD.   134/67 124- CBG 97.9- temp Pt has port in chest.

## 2021-11-10 NOTE — Progress Notes (Signed)
[place] Work  Initial Assessment   Roberto Collins is a 71 y.o. year old male contacted by phone. Clinical Social Work was referred by  Engineer, site for assessment of psychosocial needs. CSW with primary caregiver, patient's sister Roberto Collins [phone].  CSW contacted patient but was unable to leave voicemail because it is not set up.   SDOH (Social Determinants of Health) assessments performed: Yes   Distress Screen completed: No ONCBCN DISTRESS SCREENING 10/03/2021  Screening Type Initial Screening  Distress experienced in past week (1-10) 4  Information Concerns Type Lack of info about diagnosis      Family/Social Information:  Housing Arrangement: patient lives alone. Family members/support persons in your life? Family Transportation concerns: no, sister   Employment: Retired and Disabled. Income source: Social Security Disability Financial concerns:  Some concerns in reference to the affordability of hiring private caregivers.  Type of concern: Care giving (Child care or elder care services) Food access concerns: no, but patient is refusing to eat regularly.  Patient stated to his sister ms.  Religious or spiritual practice: no Services Currently in place:  Medicare, Home Health PT  Coping/ Adjustment to diagnosis: Patient understands treatment plan and what happens next? yes Concerns about diagnosis and/or treatment:  Roberto Collins state the patient has not expressed concerns about treatment. Patient reported stressors: Finances, Transportation, Depression, Adjusting to my illness, Isolation/ feeling alone, Feeling hopeless, and Roberto Collins stated these stressors are her main concern for the patient. Hopes and priorities: Patient stated he wants to fight  Patient enjoys  N/A Current coping skills/ strengths: Armed forces logistics/support/administrative officer , Motivation for treatment/growth , and Supportive family/friends     SUMMARY: Current SDOH Barriers:  Financial constraints  related to fixed income, Level of care concerns, ADL IADL limitations, Mental Health Concerns , Social Isolation, and Limited access to caregiver  Clinical Social Work Clinical Goal(s):  Roberto Collins stated they only need they had at the moment was the need for a private car giver, and the patient cannot afford to pay for one.  Interventions: Discussed common feeling and emotions when being diagnosed with cancer, and the importance of support during treatment Informed patient of the support team roles and support services at [facility] Provided CSW contact information and encouraged patient to call with any questions or concerns Provided patient with information about resources available to the patient and Roberto Collins and the role of CSW in patient care.   Follow Up Plan: Patient will contact CSW with any support or resource needs Patient verbalizes understanding of plan: Yes    Roberto Collins , LCSW

## 2021-11-10 NOTE — ED Triage Notes (Addendum)
Pt here via [facility] from home with generalized weakness. Pt in NAD in triage. Pt would like blood work taken from power port when in room.

## 2021-11-10 NOTE — Telephone Encounter (Signed)
I spoke with patient's pain physician, Dr. Holland Falling, who agreed with [place] assuming primary prescribing for patient's opioids.  Of note, patient has had chronic insomnia and has been tried on multiple previous medications including Nucynta, mirtazapine, trazodone, Seroquel, Rozerem, doxepin, melatonin.  Apparently, patient was effectively on Thorazine 300 mg nightly for years but this was discontinued due to TD concerned that patient was developing tardive dyskinesia.  Dr. Luetta Nutting thought it would be reasonable to restart Thorazine for insomnia if needed.

## 2021-11-10 NOTE — H&P (Signed)
History and Physical    [name]:[id number] DOB: [birth year] DOA: 11/10/2021  PCP: [name], PA-C  Patient coming from: Home.  Chief Complaint: Weakness.  HPI: Roberto Collins is a 71 y.o. 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  He denies some productive cough denies any shortness of breath or chest pain.  ED Course: In the ER patient appears nonfocal appears generally weak dehydrated and malnourished.  Abdomen appears benign on exam.  Labs show mild hypokalemia with potassium 3.4 high sensitive troponins were negative lactic acid normal hemoglobin around baseline chest x-ray showing possible infiltrates and mild pleural effusion.  Albumin is 2.6.  COVID test negative.  EKG shows normal sinus rhythm.  Patient admitted for generalized weakness likely from deconditioning poor appetite and possible aspiration pneumonia.  Review of Systems: As per HPI, rest all negative.   Past Medical History:  Diagnosis Date   Anemia    Asthma    Back pain    Blood transfusion without reported diagnosis     Past Surgical History:  Procedure Laterality Date   BACK SURGERY  2000   COLONOSCOPY WITH PROPOFOL N/A 04/02/2019   Procedure: COLONOSCOPY WITH PROPOFOL;  Surgeon: Virgel Manifold, MD;  Location: [facility] ENDOSCOPY;  Service: Endoscopy;  Laterality: N/A;   COLONOSCOPY WITH PROPOFOL N/A 04/20/2020   Procedure: COLONOSCOPY WITH PROPOFOL;  Surgeon: Virgel Manifold, MD;  Location: [facility] ENDOSCOPY;  Service: Endoscopy;  Laterality: N/A;   COLONOSCOPY WITH PROPOFOL N/A 07/28/2021   Procedure: COLONOSCOPY WITH PROPOFOL;  Surgeon: Virgel Manifold, MD;  Location: [facility] ENDOSCOPY;  Service: Endoscopy;  Laterality: N/A;   ESOPHAGOGASTRODUODENOSCOPY Left  03/31/2019   Procedure: ESOPHAGOGASTRODUODENOSCOPY (EGD);  Surgeon: Virgel Manifold, MD;  Location: [facility] ENDOSCOPY;  Service: Endoscopy;  Laterality: Left;   GIVENS CAPSULE STUDY N/A 04/02/2019   Procedure: GIVENS CAPSULE STUDY;  Surgeon: Virgel Manifold, MD;  Location: [facility] ENDOSCOPY;  Service: Endoscopy;  Laterality: N/A;   IR IMAGING GUIDED PORT INSERTION  10/27/2021   XI ROBOT ASSISTED TRANSANAL RESECTION N/A 06/25/2019   Procedure: XI ROBOT ASSISTED PARTIAL PROCTECTOMY OF RECTAL MASS USING TAMIS;  Surgeon: [name], MD;  Location: [facility];  Service: General;  Laterality: N/A;     reports that he has been smoking cigarettes. He started smoking about 12 years ago. He has a 20.00 pack-year smoking history. He has been exposed to tobacco smoke. He has never used smokeless tobacco. He reports that he does not currently use alcohol. He reports that he does not use drugs.  Allergies  Allergen Reactions   Bee Venom Anaphylaxis   Penicillins Anaphylaxis and Swelling    Did it involve swelling of the face/tongue/throat, SOB, or low BP? Yes Did it involve sudden or severe rash/hives, skin peeling, or any reaction on the inside of your mouth or nose? No Did you need to seek medical attention at a hospital or doctor's office? Yes When did it last happen?  Many years ago If all above answers are "NO", may proceed with cephalosporin use.     Family History  Problem Relation Age of Onset   Dementia Mother    Liver cancer Father     Prior to Admission medications   Medication Sig Start Date End Date Taking? Authorizing Provider  Cholecalciferol (VITAMIN D) 50 MCG (2000 UT) CAPS Take 2,000 Units by mouth daily.   Yes [provider]  cyclobenzaprine (FLEXERIL) 10 MG tablet Take 10 mg by mouth 3 (three) times daily as needed for muscle spasms.   Yes [provider]  gabapentin (NEURONTIN) 600 MG tablet Take 1,800 mg by mouth at bedtime.    Yes [provider]   morphine (MS CONTIN) 60 MG 12 hr tablet Take 60 mg by mouth every 12 (twelve) hours.   Yes [provider]  nystatin (MYCOSTATIN) 100000 UNIT/ML suspension Take 5 mLs (500,000 Units total) by mouth 4 (four) times daily. 10/24/21  Yes Kathrine Haddock, NP  albuterol (VENTOLIN HFA) 108 (90 Base) MCG/ACT inhaler Inhale 2 puffs into the lungs every 6 (six) hours as needed for wheezing or shortness of breath.    [provider]  aspirin EC 81 MG tablet Take 81 mg by mouth daily. Swallow whole.    [provider]  chlorproMAZINE (THORAZINE) 100 MG tablet Take 300 mg by mouth at bedtime. Patient not taking: Reported on 11/10/2021    [provider]  cyclobenzaprine (FLEXERIL) 10 MG tablet Take 10 mg by mouth at bedtime.    [provider]  dexamethasone (DECADRON) 4 MG tablet Take 2 tablets (8 mg total) by mouth daily. Start the day after chemotherapy for 2 days. Take with food. Patient not taking: Reported on 11/10/2021 10/30/21   Cammie Sickle, MD  doxepin (SINEQUAN) 25 MG capsule Take 25 mg by mouth at bedtime.    [provider]  fluticasone-salmeterol (ADVAIR) 250-50 MCG/ACT AEPB Inhale 1 puff into the lungs in the morning and at bedtime. Patient not taking: Reported on 10/24/2021    [provider]  lidocaine-prilocaine (EMLA) cream Apply to affected area once 10/30/21   Cammie Sickle, MD  ondansetron (ZOFRAN) 8 MG tablet Take 1 tablet (8 mg total) by mouth 2 (two) times daily as needed for refractory nausea / vomiting. Start on day 3 after chemotherapy. 10/30/21   Cammie Sickle, MD  oxyCODONE-acetaminophen (PERCOCET/ROXICET) 5-325 MG tablet Take 1 tablet by mouth every 4 (four) hours as needed for severe pain.    [provider]  pantoprazole (PROTONIX) 20 MG tablet Take 1 tablet (20 mg total) by mouth daily. Patient not taking: Reported on 11/10/2021 11/08/21   Borders, Kirt Boys, NP  prochlorperazine (COMPAZINE)  10 MG tablet Take 1 tablet (10 mg total) by mouth every 6 (six) hours as needed (Nausea or vomiting). 10/30/21   Cammie Sickle, MD  rOPINIRole (REQUIP) 1 MG tablet Take 1 mg by mouth at bedtime as needed. Patient not taking: Reported on 11/01/2021 07/04/21   [provider]  rosuvastatin (CRESTOR) 5 MG tablet Take 1 tablet (5 mg total) by mouth at bedtime. Patient not taking: Reported on 11/10/2021 02/14/21   [name], PA-C  Sod Picosulfate-Mag Ox-Cit Acd (CLENPIQ) 10-3.5-12 MG-GM -GM/160ML SOLN Take 1 kit by mouth as directed. At 5 PM evening before procedure, drink 1 bottle of Clenpiq, hydrate, drink (5) 8 oz of water. Then do the same thing 5 hours prior to your procedure. Patient not taking: Reported on 10/24/2021 06/07/21   Virgel Manifold, MD    Physical Exam: Constitutional: Moderately built and poorly nourished. Vitals:   11/10/21 1118 11/10/21 1401  BP: 121/71 120/86  Pulse: 66 89  Resp: 18 20  Temp: 98.2 F (36.8 C)   TempSrc: Oral   SpO2: 97% 99%  Weight:  55.3 kg   Height: $Remove'5\' 10"'fSpFGdO$  (1.778 m)    Eyes: Anicteric no pallor. ENMT: No discharge from the ears eyes nose and mouth. Neck: No mass felt.  No neck rigidity. Respiratory: No rhonchi or crepitations. Cardiovascular: S1-S2 heard. Abdomen: Soft nontender bowel sound present. Musculoskeletal: Edema present on both lower extremities. Skin: No rash. Neurologic: Alert awake oriented time place and person.  Moves all extremities. Psychiatric: Appears normal.  Normal affect.   Labs on Admission: I have personally reviewed following labs and imaging studies  CBC: Recent Labs  Lab 11/08/21 0818 11/10/21 1244  WBC 7.2 5.9  NEUTROABS 6.2  --   HGB 10.8* 10.3*  HCT 32.7* 31.0*  MCV 86.7 87.8  PLT 166 037   Basic Metabolic Panel: Recent Labs  Lab 11/08/21 0818 11/10/21 1244  NA 135 137  K 3.5 3.4*  CL 101 102  CO2 26 22  GLUCOSE 109* 96  BUN 25* 20  CREATININE 0.64 0.32*  CALCIUM 8.2* 7.9*    GFR: Estimated Creatinine Clearance: 68.2 mL/min (A) (by C-G formula based on SCr of 0.32 mg/dL (L)). Liver Function Tests: Recent Labs  Lab 11/08/21 0818 11/10/21 1244  AST 24 19  ALT 18 16  ALKPHOS 64 58  BILITOT 0.3 0.4  PROT 6.1* 5.7*  ALBUMIN 2.8* 2.6*   No results for input(s): LIPASE, AMYLASE in the last 168 hours. No results for input(s): AMMONIA in the last 168 hours. Coagulation Profile: No results for input(s): INR, PROTIME in the last 168 hours. Cardiac Enzymes: No results for input(s): CKTOTAL, CKMB, CKMBINDEX, TROPONINI in the last 168 hours. BNP (last 3 results) No results for input(s): PROBNP in the last 8760 hours. HbA1C: No results for input(s): HGBA1C in the last 72 hours. CBG: No results for input(s): GLUCAP in the last 168 hours. Lipid Profile: No results for input(s): CHOL, HDL, LDLCALC, TRIG, CHOLHDL, LDLDIRECT in the last 72 hours. Thyroid Function Tests: No results for input(s): TSH, T4TOTAL, FREET4, T3FREE, THYROIDAB in the last 72 hours. Anemia Panel: No results for input(s): VITAMINB12, FOLATE, FERRITIN, TIBC, IRON, RETICCTPCT in the last 72 hours. Urine analysis:    Component Value Date/Time   COLORURINE YELLOW (A) 03/30/2019 1235   APPEARANCEUR CLEAR (A) 03/30/2019 1235   LABSPEC 1.009 03/30/2019 1235   PHURINE 7.0 03/30/2019 1235   GLUCOSEU NEGATIVE 03/30/2019 1235   HGBUR NEGATIVE 03/30/2019 1235   BILIRUBINUR NEGATIVE 03/30/2019 [place] 03/30/2019 1235   PROTEINUR NEGATIVE 03/30/2019 1235   NITRITE NEGATIVE 03/30/2019 [place] 03/30/2019 1235   Sepsis Labs: $RemoveBefo'@[id number]$ (procalcitonin:4,lacticidven:4) ) Recent Results (from the past 240 hour(s))  Resp Panel by RT-PCR (Flu A&B, Covid) Nasopharyngeal Swab     Status: None   Collection Time: 11/10/21 12:44 PM   Specimen: Nasopharyngeal Swab; Nasopharyngeal(NP) swabs in vial transport medium  Result Value Ref Range Status   SARS Coronavirus 2 by  RT PCR NEGATIVE NEGATIVE Final    Comment: (NOTE) SARS-CoV-2 target nucleic acids are NOT DETECTED.  The SARS-CoV-2 RNA is generally detectable in upper respiratory specimens during the acute phase of infection. The lowest concentration of SARS-CoV-2 viral copies this assay can detect is 138 copies/mL. A negative result does not preclude SARS-Cov-2 infection and should not be used as the sole basis for treatment or other patient management decisions. A negative result may occur with  improper specimen collection/handling, submission of specimen other than nasopharyngeal swab, presence of viral mutation(s) within the areas targeted by this assay,  and inadequate number of viral copies(<138 copies/mL). A negative result must be combined with clinical observations, patient history, and epidemiological information. The expected result is Negative.  Fact Sheet for Patients:  EntrepreneurPulse.com.au  Fact Sheet for Healthcare Providers:  IncredibleEmployment.be  This test is no t yet approved or cleared by the [place] FDA and  has been authorized for detection and/or diagnosis of SARS-CoV-2 by FDA under an Emergency Use Authorization (EUA). This EUA will remain  in effect (meaning this test can be used) for the duration of the COVID-19 declaration under Section 564(b)(1) of the Act, 21 U.S.C.section 360bbb-3(b)(1), unless the authorization is terminated  or revoked sooner.       Influenza A by PCR NEGATIVE NEGATIVE Final   Influenza B by PCR NEGATIVE NEGATIVE Final    Comment: (NOTE) The Xpert Xpress SARS-CoV-2/FLU/RSV plus assay is intended as an aid in the diagnosis of influenza from Nasopharyngeal swab specimens and should not be used as a sole basis for treatment. Nasal washings and aspirates are unacceptable for Xpert Xpress SARS-CoV-2/FLU/RSV testing.  Fact Sheet for Patients: EntrepreneurPulse.com.au  Fact Sheet  for Healthcare Providers: IncredibleEmployment.be  This test is not yet approved or cleared by the [place] FDA and has been authorized for detection and/or diagnosis of SARS-CoV-2 by FDA under an Emergency Use Authorization (EUA). This EUA will remain in effect (meaning this test can be used) for the duration of the COVID-19 declaration under Section 564(b)(1) of the Act, 21 U.S.C. section 360bbb-3(b)(1), unless the authorization is terminated or revoked.  Performed at [facility], [address], [place], [place] [address]      Radiological Exams on Admission: DG Abd 1 View  Result Date: 11/10/2021 CLINICAL DATA:  Generalized weakness. History of stage IV gastric cancer. EXAM: ABDOMEN - 1 VIEW COMPARISON:  None. FINDINGS: The bowel gas pattern is normal. No radio-opaque calculi or other significant radiographic abnormality are seen. IMPRESSION: No abnormal bowel dilatation is noted. Electronically Signed   By: Marijo Conception M.D.   On: 11/10/2021 14:56   DG Chest Port 1 View  Result Date: 11/10/2021 CLINICAL DATA:  Weakness EXAM: PORTABLE CHEST 1 VIEW COMPARISON:  Chest x-ray dated August 03, 2019 FINDINGS: Cardiac and mediastinal contours are within normal limits. Right chest wall port with tip positioned near the superior cavoatrial junction. New right basilar opacity. Small right pleural effusion. IMPRESSION: 1. New right basilar opacity, concerning for infection or aspiration. 2. Small right pleural effusion. Electronically Signed   By: Yetta Glassman M.D.   On: 11/10/2021 12:39    EKG: Independently reviewed.  Normal sinus rhythm.  Assessment/Plan Principal Problem:   Weakness Active Problems:   Pulmonary emphysema (HCC)   Peritoneal carcinomatosis (HCC)   Pneumonia    Generalized weakness with poor appetite likely a deconditioning with known history of peritoneal carcinomatosis with rectal carcinoma stage IV with recent chemotherapy  about 10 days ago.  Gently hydrate.  Physical therapy consult.  Get nutrition consult since patient has poor appetite.  We will also check a CT abdomen. Possible aspiration pneumonia on empiric antibiotics.  We will get swallow evaluation. Anemia appears to be chronic.  Closely follow CBC. History of COPD as mentioned in the chart presently not wheezing. History of hyperlipidemia has not been taking his Crestor. Severe protein calorie malnutrition with low albumin and also has some third spacing of the lower extremities with edema.  We will get nutrition consult.   DVT prophylaxis: Lovenox. Code Status: DNR. Family  Communication: Patient's sister-in-law at the bedside. Disposition Plan: To be determined. Consults called: None. Admission status: Observation.   Rise Patience MD [facility] Pager [phone].  If 7PM-7AM, please contact night-coverage [email or web address] Password [facility]  11/10/2021, 3:06 PM

## 2021-11-10 NOTE — ED Provider Notes (Signed)
[facility] Provider Note    Event Date/Time   First MD Initiated Contact with Patient 11/10/21 1223     (approximate)   History   Weakness   HPI  Roberto Collins is a 71 y.o. male with history of colon cancer, depression, emphysema, asthma, and chronic back pain presents emergency department with increasing weakness.  Patient states he is unable to stand to get out of his chair.  His sister is in the room and states that she has been giving him food and leaving right beside his chair but states he is not eating or drinking.  Patient states he has had a cough for several days and does feel a little short of breath.  He denies that he had fever or chills.  Denies abdominal pain or chest pain.      Physical Exam   Triage Vital Signs: ED Triage Vitals [11/10/21 1118]  Enc Vitals Group     BP 121/71     Pulse Rate 66     Resp 18     Temp 98.2 F (36.8 C)     Temp Source Oral     SpO2 97 %     Weight 122 lb (55.3 kg)     Height 5\' 10"  (1.778 m)     Head Circumference      Peak Flow      Pain Score 0     Pain Loc      Pain Edu?      Excl. in [place]?     Most recent vital signs: Vitals:   11/10/21 1118 11/10/21 1401  BP: 121/71 120/86  Pulse: 66 89  Resp: 18 20  Temp: 98.2 F (36.8 C)   SpO2: 97% 99%     General: Awake, no distress.  Patient is very thin, appears to be weak CV:  Good peripheral perfusion. regular rate and  rhythm Resp:  Normal effort. Lungs with decreased breath sounds bilaterally Abd:  No distention.  Nontender Other:  Patient is able to move extremities but is very weak when trying to set up, cranial nerves II through XII grossly intact   ED Results / Procedures / Treatments   Labs (all labs ordered are listed, but only abnormal results are displayed) Labs Reviewed  CBC - Abnormal; Notable for the following components:      Result Value   RBC 3.53 (*)    Hemoglobin 10.3 (*)    HCT 31.0 (*)    All other components  within normal limits  COMPREHENSIVE METABOLIC PANEL - Abnormal; Notable for the following components:   Potassium 3.4 (*)    Creatinine, Ser 0.32 (*)    Calcium 7.9 (*)    Total Protein 5.7 (*)    Albumin 2.6 (*)    All other components within normal limits  RESP PANEL BY RT-PCR (FLU A&B, COVID) ARPGX2  CULTURE, BLOOD (ROUTINE X 2)  CULTURE, BLOOD (ROUTINE X 2)  LACTIC ACID, PLASMA  LACTIC ACID, PLASMA  TROPONIN I (HIGH SENSITIVITY)  TROPONIN I (HIGH SENSITIVITY)     EKG  EKG showed an arrhythmia, bradycardic, see physician review   RADIOLOGY Chest x-ray    PROCEDURES:   Procedures   MEDICATIONS ORDERED IN ED: Medications  0.9 %  sodium chloride infusion (has no administration in time range)  levofloxacin (LEVAQUIN) IVPB 750 mg (has no administration in time range)  morphine (MSIR) tablet 60 mg (60 mg Oral Given 11/10/21 1354)  IMPRESSION / MDM / ASSESSMENT AND PLAN / ED COURSE  I reviewed the triage vital signs and the nursing notes.                              Differential diagnosis includes, but is not limited to, failure to thrive, CAP, COVID, MI, CHF, new malignancy  Labs are reassuring, patient's labs are within his normal trend, CBC appears to be in his normal trend, comprehensive metabolic panel shows normal trend, troponin is normal, lactic acid is pending  Chest x-ray was reviewed by me does show a pneumonia, radiologist read confirmed  Consult with pharmacist due to patient's anaphylactic reaction with penicillins.  We need to cover him for aspiration pneumonia.  Pharmacist is researching at this time and will call us back.  Consult hospitalist by Dr. Corky Downs for admission  Spoke with the pharmacist, recommends clindamycin if only aspiration pneumonia, recommends clindamycin and Levaquin if concerned of respiratory organisms.  Everything explained to the patient and his sister.  See Dr. Kyra Manges note for consult hospitalist. Patient is being  admitted in stable condition at this time    Clinical Course as of 11/10/21 1408  Fri Nov 10, 2021  Angelica 1 View [RK]    Clinical Course User Index [RK] Lavonia Drafts, MD     FINAL CLINICAL IMPRESSION(S) / ED DIAGNOSES   Final diagnoses:  Aspiration pneumonia of right lower lobe, unspecified aspiration pneumonia type [facility])     Rx / DC Orders   ED Discharge Orders     None        Note:  This document was prepared using Dragon voice recognition software and may include unintentional dictation errors.    Versie Starks, PA-C 11/10/21 1409    Lavonia Drafts, MD 11/10/21 [phone]

## 2021-11-10 NOTE — Consult Note (Signed)
Pharmacy Antibiotic Note  Roberto Collins is a 71 y.o. male admitted on 11/10/2021 with pneumonia.  Pharmacy has been consulted for Levofloxacin dosing.  Plan: Levofloxacin 750mg  IV x 5 days  Height: 5\' 10"  (177.8 cm) Weight: 55.3 kg (122 lb) IBW/kg (Calculated) : 73  Temp (24hrs), Avg:98.2 F (36.8 C), Min:98.2 F (36.8 C), Max:98.2 F (36.8 C)  Recent Labs  Lab 11/08/21 0818 11/10/21 1244 11/10/21 1357  WBC 7.2 5.9  --   CREATININE 0.64 0.32*  --   LATICACIDVEN  --   --  0.7    Estimated Creatinine Clearance: 68.2 mL/min (A) (by C-G formula based on SCr of 0.32 mg/dL (L)).    Allergies  Allergen Reactions   Bee Venom Anaphylaxis   Penicillins Anaphylaxis and Swelling    Did it involve swelling of the face/tongue/throat, SOB, or low BP? Yes Did it involve sudden or severe rash/hives, skin peeling, or any reaction on the inside of your mouth or nose? No Did you need to seek medical attention at a hospital or doctor's office? Yes When did it last happen?  Many years ago If all above answers are "NO", may proceed with cephalosporin use.     Antimicrobials this admission: Levofloxacin 2/10 >> 2/14(stop date in place)  Microbiology results: 2/10 BCx: pending  Thank you for allowing pharmacy to be a part of this patients care.  Pearla Dubonnet 11/10/2021 3:41 PM

## 2021-11-10 NOTE — Telephone Encounter (Signed)
Returned call to pt's sister-in-law as requested per triage message. Anne Ng just wanted [place] to be aware that pt was currently in the ED. Informed her that we would review his hospital notes and would f/u with him after discharge.

## 2021-11-11 DIAGNOSIS — J690 Pneumonitis due to inhalation of food and vomit: Secondary | ICD-10-CM | POA: Diagnosis present

## 2021-11-11 DIAGNOSIS — T451X5A Adverse effect of antineoplastic and immunosuppressive drugs, initial encounter: Secondary | ICD-10-CM | POA: Diagnosis present

## 2021-11-11 DIAGNOSIS — E876 Hypokalemia: Secondary | ICD-10-CM | POA: Diagnosis present

## 2021-11-11 DIAGNOSIS — J449 Chronic obstructive pulmonary disease, unspecified: Secondary | ICD-10-CM | POA: Diagnosis not present

## 2021-11-11 DIAGNOSIS — Z515 Encounter for palliative care: Secondary | ICD-10-CM | POA: Diagnosis not present

## 2021-11-11 DIAGNOSIS — C786 Secondary malignant neoplasm of retroperitoneum and peritoneum: Secondary | ICD-10-CM | POA: Diagnosis present

## 2021-11-11 DIAGNOSIS — T148XXA Other injury of unspecified body region, initial encounter: Secondary | ICD-10-CM | POA: Diagnosis present

## 2021-11-11 DIAGNOSIS — Z800 Family history of malignant neoplasm of digestive organs: Secondary | ICD-10-CM | POA: Diagnosis not present

## 2021-11-11 DIAGNOSIS — Z86010 Personal history of colonic polyps: Secondary | ICD-10-CM | POA: Diagnosis not present

## 2021-11-11 DIAGNOSIS — E43 Unspecified severe protein-calorie malnutrition: Secondary | ICD-10-CM | POA: Diagnosis present

## 2021-11-11 DIAGNOSIS — E785 Hyperlipidemia, unspecified: Secondary | ICD-10-CM | POA: Diagnosis present

## 2021-11-11 DIAGNOSIS — C20 Malignant neoplasm of rectum: Secondary | ICD-10-CM | POA: Diagnosis not present

## 2021-11-11 DIAGNOSIS — Y92009 Unspecified place in unspecified non-institutional (private) residence as the place of occurrence of the external cause: Secondary | ICD-10-CM | POA: Diagnosis not present

## 2021-11-11 DIAGNOSIS — Z20822 Contact with and (suspected) exposure to covid-19: Secondary | ICD-10-CM | POA: Diagnosis present

## 2021-11-11 DIAGNOSIS — Z85048 Personal history of other malignant neoplasm of rectum, rectosigmoid junction, and anus: Secondary | ICD-10-CM | POA: Diagnosis not present

## 2021-11-11 DIAGNOSIS — D61810 Antineoplastic chemotherapy induced pancytopenia: Secondary | ICD-10-CM | POA: Diagnosis present

## 2021-11-11 DIAGNOSIS — J90 Pleural effusion, not elsewhere classified: Secondary | ICD-10-CM | POA: Diagnosis present

## 2021-11-11 DIAGNOSIS — Z66 Do not resuscitate: Secondary | ICD-10-CM | POA: Diagnosis present

## 2021-11-11 DIAGNOSIS — F32A Depression, unspecified: Secondary | ICD-10-CM | POA: Diagnosis present

## 2021-11-11 DIAGNOSIS — D6959 Other secondary thrombocytopenia: Secondary | ICD-10-CM | POA: Diagnosis present

## 2021-11-11 DIAGNOSIS — R180 Malignant ascites: Secondary | ICD-10-CM | POA: Diagnosis present

## 2021-11-11 DIAGNOSIS — R64 Cachexia: Secondary | ICD-10-CM | POA: Diagnosis present

## 2021-11-11 DIAGNOSIS — J439 Emphysema, unspecified: Secondary | ICD-10-CM | POA: Diagnosis present

## 2021-11-11 DIAGNOSIS — F17210 Nicotine dependence, cigarettes, uncomplicated: Secondary | ICD-10-CM | POA: Diagnosis present

## 2021-11-11 DIAGNOSIS — W57XXXA Bitten or stung by nonvenomous insect and other nonvenomous arthropods, initial encounter: Secondary | ICD-10-CM | POA: Diagnosis present

## 2021-11-11 DIAGNOSIS — R531 Weakness: Secondary | ICD-10-CM | POA: Diagnosis present

## 2021-11-11 DIAGNOSIS — Z681 Body mass index (BMI) 19 or less, adult: Secondary | ICD-10-CM | POA: Diagnosis not present

## 2021-11-11 DIAGNOSIS — E860 Dehydration: Secondary | ICD-10-CM | POA: Diagnosis present

## 2021-11-11 DIAGNOSIS — D611 Drug-induced aplastic anemia: Secondary | ICD-10-CM | POA: Diagnosis present

## 2021-11-11 LAB — COMPREHENSIVE METABOLIC PANEL
ALT: 15 U/L (ref 0–44)
AST: 18 U/L (ref 15–41)
Albumin: 2.5 g/dL — ABNORMAL LOW (ref 3.5–5.0)
Alkaline Phosphatase: 54 U/L (ref 38–126)
Anion gap: 7 (ref 5–15)
BUN: 18 mg/dL (ref 8–23)
CO2: 27 mmol/L (ref 22–32)
Calcium: 7.9 mg/dL — ABNORMAL LOW (ref 8.9–10.3)
Chloride: 102 mmol/L (ref 98–111)
Creatinine, Ser: 0.58 mg/dL — ABNORMAL LOW (ref 0.61–1.24)
GFR, Estimated: >60 mL/min (ref >60)
Glucose, Bld: 98 mg/dL (ref 70–99)
Potassium: 3.6 mmol/L (ref 3.5–5.1)
Sodium: 136 mmol/L (ref 135–145)
Total Bilirubin: 0.4 mg/dL (ref 0.3–1.2)
Total Protein: 5.4 g/dL — ABNORMAL LOW (ref 6.5–8.1)

## 2021-11-11 LAB — CBC
HCT: 29.6 % — ABNORMAL LOW (ref 39.0–52.0)
Hemoglobin: 9.9 g/dL — ABNORMAL LOW (ref 13.0–17.0)
MCH: 29.4 pg (ref 26.0–34.0)
MCHC: 33.4 g/dL (ref 30.0–36.0)
MCV: 87.8 fL (ref 80.0–100.0)
Platelets: 147 10*3/uL — ABNORMAL LOW (ref 150–400)
RBC: 3.37 MIL/uL — ABNORMAL LOW (ref 4.22–5.81)
RDW: 14.9 % (ref 11.5–15.5)
WBC: 4.2 10*3/uL (ref 4.0–10.5)
nRBC: 0 % (ref 0.0–0.2)

## 2021-11-11 MED ORDER — SODIUM CHLORIDE 0.9 % IV SOLN: INTRAVENOUS | Status: DC

## 2021-11-11 MED ORDER — CYCLOBENZAPRINE HCL 10 MG PO TABS
10.0000 mg | ORAL_TABLET | Freq: Three times a day (TID) | ORAL | Status: DC | PRN
  Administered 2021-11-11 – 2021-11-13 (×2): 10 mg via ORAL
  Filled 2021-11-11 (×2): qty 1

## 2021-11-11 MED ORDER — PROSOURCE PLUS PO LIQD
30.0000 mL | Freq: Two times a day (BID) | ORAL | Status: DC
  Administered 2021-11-11: 30 mL via ORAL
  Filled 2021-11-11 (×5): qty 30

## 2021-11-11 MED ORDER — PERMETHRIN 5 % EX CREA
TOPICAL_CREAM | Freq: Once | CUTANEOUS | Status: AC
  Filled 2021-11-11: qty 60

## 2021-11-11 MED ORDER — IPRATROPIUM-ALBUTEROL 0.5-2.5 (3) MG/3ML IN SOLN: 3.0000 mL | Freq: Four times a day (QID) | RESPIRATORY_TRACT | Status: DC | PRN

## 2021-11-11 MED ORDER — CYCLOBENZAPRINE HCL 10 MG PO TABS
10.0000 mg | ORAL_TABLET | Freq: Every day | ORAL | Status: DC
  Administered 2021-11-12 – 2021-11-16 (×4): 10 mg via ORAL
  Filled 2021-11-11 (×6): qty 1

## 2021-11-11 MED ORDER — ENSURE ENLIVE PO LIQD
237.0000 mL | Freq: Two times a day (BID) | ORAL | Status: DC
  Administered 2021-11-11: 10:00:00 237 mL via ORAL

## 2021-11-11 MED ORDER — ADULT MULTIVITAMIN W/MINERALS CH
1.0000 | ORAL_TABLET | Freq: Every day | ORAL | Status: DC
  Administered 2021-11-11 – 2021-11-17 (×7): 1 via ORAL
  Filled 2021-11-11 (×7): qty 1

## 2021-11-11 MED ORDER — CHLORHEXIDINE GLUCONATE CLOTH 2 % EX PADS
6.0000 | MEDICATED_PAD | Freq: Every day | CUTANEOUS | Status: DC
  Administered 2021-11-11 – 2021-11-17 (×7): 6 via TOPICAL

## 2021-11-11 MED ORDER — ENSURE ENLIVE PO LIQD
237.0000 mL | Freq: Three times a day (TID) | ORAL | Status: DC
  Administered 2021-11-11: 20:00:00 237 mL via ORAL

## 2021-11-11 NOTE — TOC Initial Note (Addendum)
Transition of Care [facility]) - Initial/Assessment Note    Patient Details  Name: Roberto Collins MRN: [id number] Date of Birth: 1951/06/30  Transition of Care [facility]) CM/SW Contact:    Magnus Ivan, LCSW Phone Number: 11/11/2021, 1:53 PM  Clinical Narrative:                Spoke to patient regarding DC planning. Patient lives with a roommate who is only home at nights. Sister in law provides transportation. Pcp is Dr. Jacinto Reap at [facility]. [facility]acy is [facility] [place].  No HH, DME or SNF history per patient.  Confirmed home address. Patient is agreeable to HHPT rec, no agency preference. [place] referral made to [facility] with [place].   5:00- Per OT, patient's home has bed bugs but is scheduled to be fumigated next week. CSW notified [place] with [facility].    Expected Discharge Plan: [place] Barriers to Discharge: Continued Medical Work up   Patient Goals and CMS Choice Patient states their goals for this hospitalization and ongoing recovery are:: home with roommate, agreeable to [facility] CMS Medicare.gov Compare Post Acute Care list provided to:: Patient Choice offered to / list presented to : Patient  Expected Discharge Plan and Services Expected Discharge Plan: [place]       Living arrangements for the past 2 months: Single Family Home                                      Prior Living Arrangements/Services Living arrangements for the past 2 months: Single Family Home Lives with:: Roommate Patient language and need for interpreter reviewed:: Yes Do you feel safe going back to the place where you live?: Yes      Need for Family Participation in Patient Care: Yes (Comment) Care giver support system in place?: Yes (comment)   Criminal Activity/Legal Involvement Pertinent to Current Situation/Hospitalization: No - Comment as needed  Activities of Daily Living Home Assistive Devices/Equipment: None ADL Screening (condition at time of  admission) Patient's cognitive ability adequate to safely complete daily activities?: Yes Is the patient deaf or have difficulty hearing?: No Does the patient have difficulty seeing, even when wearing glasses/contacts?: No Does the patient have difficulty concentrating, remembering, or making decisions?: No Patient able to express need for assistance with ADLs?: Yes Does the patient have difficulty dressing or bathing?: Yes Independently performs ADLs?: No Communication: Independent Dressing (OT): Needs assistance Is this a change from baseline?: Change from baseline, expected to last >3 days Grooming: Needs assistance Is this a change from baseline?: Change from baseline, expected to last >3 days Feeding: Independent Bathing: Needs assistance Is this a change from baseline?: Change from baseline, expected to last >3 days Toileting: Independent In/Out Bed: Needs assistance Is this a change from baseline?: Change from baseline, expected to last >3 days Walks in Home: Needs assistance Is this a change from baseline?: Change from baseline, expected to last >3 days Does the patient have difficulty walking or climbing stairs?: Yes Weakness of Legs: Both Weakness of Arms/Hands: Both  Permission Sought/Granted Permission sought to share information with : Facility Art therapist granted to share information with : Yes, Verbal Permission Granted     Permission granted to share info w AGENCY: Home Health agencies        Emotional Assessment       Orientation: : Oriented to Self, Oriented to  Place, Oriented to  Time, Oriented to Situation Alcohol / Substance Use: Not Applicable Psych Involvement: No (comment)  Admission diagnosis:  Weakness [R53.1] Pain [R52] Nausea & vomiting [R11.2] Aspiration pneumonia of right lower lobe, unspecified aspiration pneumonia type [facility]) [J69.0] Patient Active Problem List   Diagnosis Date Noted   Weakness 11/10/2021   Pneumonia  11/10/2021   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   Candidate for statin therapy due to risk of future cardiovascular event 05/11/2021   Coronary artery disease involving native heart without angina pectoris 05/11/2021   Atherosclerosis 05/11/2021   Pulmonary emphysema ([place]) 05/11/2021   Current every day smoker 05/11/2021   Smoking greater than 30 pack years 05/11/2021   History of colonic polyps    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 [address] Phone: [phone] Fax: [phone]     Social Determinants of Health (SDOH) Interventions    Readmission Risk Interventions No flowsheet data found.

## 2021-11-11 NOTE — Plan of Care (Signed)
  Problem: Activity: Goal: Ability to tolerate increased activity will improve Outcome: Progressing   Problem: Clinical Measurements: Goal: Ability to maintain a body temperature in the normal range will improve Outcome: Progressing   Problem: Respiratory: Goal: Ability to maintain adequate ventilation will improve Outcome: Progressing Goal: Ability to maintain a clear airway will improve Outcome: Progressing   

## 2021-11-11 NOTE — Evaluation (Signed)
Occupational Therapy Evaluation Patient Details Name: Roberto Collins MRN: [id number] DOB: August 15, 1951 Today's Date: 11/11/2021   History of Present Illness Pt is a 71 y/o M admitted on 11/10/21 after presenting with c/c of weakness; likely 2/2 deconditioning, poor appetitie & possible aspiration PNA. 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 Prior to hospital admission, pt was generally independent with basic ADL and had family support for meals, transportation, and cleaning. Currently pt demonstrates impairments as described below (See OT problem list) which functionally limit his ability to perform ADL/self-care tasks. Pt currently requires CGA for ADL transfers and supervision for seated ADL tasks for safety. Pt endorses fatigue with limited exertion. Pt would benefit from skilled OT services to address noted impairments and functional limitations (see below for any additional details) in order to maximize safety and independence while minimizing falls risk and caregiver burden. Upon hospital discharge, recommend HHOT to maximize pt safety and return to functional independence during meaningful occupations of daily life.      Recommendations for follow up therapy are one component of a multi-disciplinary discharge planning process, led by the attending physician.  Recommendations may be updated based on patient status, additional functional criteria and insurance authorization.   Follow Up Recommendations  Home health OT    Assistance Recommended at Discharge Intermittent Supervision/Assistance  Patient can return home with the following A little help with walking and/or transfers;A little help with bathing/dressing/bathroom;Assistance with cooking/housework;Assist for transportation;Help with stairs or ramp for entrance    Functional Status Assessment  Patient has had a recent decline in  their functional status and demonstrates the ability to make significant improvements in function in a reasonable and predictable amount of time.  Equipment Recommendations  None recommended by OT    Recommendations for Other Services       Precautions / Restrictions Precautions Precautions: Fall Restrictions Weight Bearing Restrictions: No      Mobility Bed Mobility               General bed mobility comments: NT, up in recliner    Transfers Overall transfer level: Needs assistance Equipment used: None Transfers: Sit to/from Stand Sit to Stand: Min guard                  Balance Overall balance assessment: Needs assistance Sitting-balance support: Feet supported Sitting balance-Leahy Scale: Good     Standing balance support: Single extremity supported, No upper extremity supported, During functional activity Standing balance-Leahy Scale: Fair                             ADL either performed or assessed with clinical judgement   ADL Overall ADL's : Needs assistance/impaired                                       General ADL Comments: Pt able to perform LB ADL tasks from seated position and complete in standing with CGA for the transfer, CGA for ADL mobility. Endorses fatigue with limited exertion, however, feels close to baseline.     Vision         Perception     Praxis      Pertinent Vitals/Pain Pain Assessment Pain Assessment: No/denies pain     Hand Dominance  Extremity/Trunk Assessment Upper Extremity Assessment Upper Extremity Assessment: Generalized weakness   Lower Extremity Assessment Lower Extremity Assessment: Generalized weakness       Communication Communication Communication: No difficulties   Cognition Arousal/Alertness: Awake/alert Behavior During Therapy: WFL for tasks assessed/performed Overall Cognitive Status: Within Functional Limits for tasks assessed                                        General Comments      Exercises     Shoulder Instructions      Home Living Family/patient expects to be discharged to:: Private residence Living Arrangements: Alone Available Help at Discharge: Family;Available PRN/intermittently Type of Home: House Home Access: Stairs to enter [employer] of Steps: 2 Entrance Stairs-Rails: Left Home Layout: One level               Home Equipment: None   Additional Comments: Sister in law Anne Ng) present reporting family/friends will be installing a ramp this coming Tuesday as pt's porch/steps are old & unstable & need to be replaced.      Prior Functioning/Environment Prior Level of Function : Independent/Modified Independent             Mobility Comments: Pt reports independence without AD, denies falls, but sister-in-law reports pt is sedentary most of the day. ADLs Comments: Family brings by a biscuit once in awhile. Sister in law assisting with cleaning/cooking. Family assist with transportation.        OT Problem List: Decreased strength;Decreased activity tolerance;Impaired balance (sitting and/or standing);Decreased knowledge of use of DME or AE      OT Treatment/Interventions: Self-care/ADL training;Therapeutic exercise;Therapeutic activities;DME and/or AE instruction;Energy conservation;Patient/family education;Balance training    OT Goals(Current goals can be found in the care plan section) Acute Rehab OT Goals Patient Stated Goal: go home OT Goal Formulation: With patient Time For Goal Achievement: 11/25/21 Potential to Achieve Goals: Good ADL Goals Additional ADL Goal #1: Pt will verbalize plan to implement at least 2 learned ECS into daily ADL/IADL routine to minimize falls risk and maximize independence. Additional ADL Goal #2: Pt will verbalize plan to implement at least 1 learned falls prevention strategy to maximzie safety/indep during ADL/IADL.  OT Frequency: Min  2X/week    Co-evaluation              AM-PAC OT "6 Clicks" Daily Activity     Outcome Measure Help from another person eating meals?: None Help from another person taking care of personal grooming?: None Help from another person toileting, which includes using toliet, bedpan, or urinal?: A Little Help from another person bathing (including washing, rinsing, drying)?: A Little Help from another person to put on and taking off regular upper body clothing?: None Help from another person to put on and taking off regular lower body clothing?: A Little 6 Click Score: 21   End of Session    Activity Tolerance: Patient tolerated treatment well Patient left: in chair;with call bell/phone within reach;with chair alarm set  OT Visit Diagnosis: Other abnormalities of gait and mobility (R26.89);Muscle weakness (generalized) (M62.81)                Time: 5643-3295 OT Time Calculation (min): 11 min Charges:  OT General Charges $OT Visit: 1 Visit OT Evaluation $OT Eval Low Complexity: 1 Low  Ardeth Perfect., MPH, MS, OTR/L ascom [phone] 11/11/21, 2:09 PM

## 2021-11-11 NOTE — Care Management Obs Status (Signed)
[place] NOTIFICATION   Patient Details  Name: Roberto Collins MRN: [id number] Date of Birth: 05/27/1951   Medicare Observation Status Notification Given:  Yes    Kerrilynn Derenzo E Jamillia Closson, LCSW 11/11/2021, 1:46 PM

## 2021-11-11 NOTE — Progress Notes (Signed)
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 Recent chemo approx 11 days ago. Continue on IVFs  HLD: not taking statin currently  Severe protein calorie malnutrition: continue on nutritional supplements   Normocytic anemia: likely secondary to recent chemo. No need for a transfusion currently   Thrombocytopenia: likely secondary to recent chemo. Will continue to monitor  COPD: w/o exacerbation. Continue on bronchodilators    DVT prophylaxis: lovenox  Code Status: DNR Family Communication: discussed pt's care w/ pt's sister in law, Anne Ng, and answered her questions  Disposition Plan: depends on PT/OT recs   Level of care: Telemetry Medical  Status is: Observation The patient remains OBS appropriate and will d/c before 2 midnights.     Consultants:    Procedures:   Antimicrobials:    Subjective: Pt c/o poor appetite   Objective: Vitals:   11/10/21 1401 11/10/21 1700 11/10/21 2146 11/11/21 0437  BP: 120/86 122/80 119/83 119/74  Pulse: 89 82 86 83  Resp: 20 18 18 18   Temp:  98.4 F (36.9 C) (!) 97.5 F (36.4 C) 97.8 F (36.6 C)  TempSrc:  Oral Oral Oral  SpO2: 99% 98% 96% 95%  Weight:      Height:        Intake/Output Summary (Last 24 hours) at 11/11/2021 0726 Last data filed at 11/11/2021 0600 Gross per 24 hour  Intake 777.5 ml  Output 200 ml  Net 577.5 ml   Filed Weights   11/10/21 1118  Weight: 55.3 kg    Examination:  General exam: Appears calm and comfortable. Cachetic appearing  Respiratory system: Clear to auscultation.  Respiratory effort normal. Cardiovascular system: S1 & S2 +. No rubs, gallops or clicks. No pedal edema. Gastrointestinal system: Abdomen is nondistended, soft and nontender. Normal bowel sounds heard. Central nervous system: Alert and oriented. Moves all extremities  Psychiatry: Judgement and insight appear normal. Flat mood and affect     Data Reviewed: I have personally reviewed following labs and imaging studies  CBC: Recent Labs  Lab 11/08/21 0818 11/10/21 1244 11/10/21 1528 11/11/21 0559  WBC 7.2 5.9 4.7 4.2  NEUTROABS 6.2  --   --   --   HGB 10.8* 10.3* 9.5* 9.9*  HCT 32.7* 31.0* 28.1* 29.6*  MCV 86.7 87.8 87.0 87.8  PLT 166 170 149* 929*   Basic Metabolic Panel: Recent Labs  Lab 11/08/21 0818 11/10/21 1244 11/10/21 1528 11/11/21 0559  NA 135 137  --  136  K 3.5 3.4*  --  3.6  CL 101 102  --  102  CO2 26 22  --  27  GLUCOSE 109* 96  --  98  BUN 25* 20  --  18  CREATININE 0.64 0.32* 0.39* 0.58*  CALCIUM 8.2* 7.9*  --  7.9*   GFR: Estimated Creatinine Clearance: 68.2 mL/min (A) (by C-G formula based on SCr of 0.58 mg/dL (L)). Liver Function Tests: Recent Labs  Lab 11/08/21 0818 11/10/21 1244 11/11/21 0559  AST 24 19 18   ALT 18 16 15   ALKPHOS 64 58 54  BILITOT 0.3 0.4 0.4  PROT 6.1* 5.7* 5.4*  ALBUMIN  2.8* 2.6* 2.5*   No results for input(s): LIPASE, AMYLASE in the last 168 hours. No results for input(s): AMMONIA in the last 168 hours. Coagulation Profile: No results for input(s): INR, PROTIME in the last 168 hours. Cardiac Enzymes: No results for input(s): CKTOTAL, CKMB, CKMBINDEX, TROPONINI in the last 168 hours. BNP (last 3 results) No results for input(s): PROBNP in the last 8760 hours. HbA1C: No results for input(s): HGBA1C in the last 72 hours. CBG: No results for input(s): GLUCAP in the last 168 hours. Lipid Profile: No results for input(s): CHOL, HDL, LDLCALC, TRIG, CHOLHDL, LDLDIRECT in the last 72 hours. Thyroid Function Tests: No  results for input(s): TSH, T4TOTAL, FREET4, T3FREE, THYROIDAB in the last 72 hours. Anemia Panel: No results for input(s): VITAMINB12, FOLATE, FERRITIN, TIBC, IRON, RETICCTPCT in the last 72 hours. Sepsis Labs: Recent Labs  Lab 11/10/21 1357  LATICACIDVEN 0.7    Recent Results (from the past 240 hour(s))  Resp Panel by RT-PCR (Flu A&B, Covid) Nasopharyngeal Swab     Status: None   Collection Time: 11/10/21 12:44 PM   Specimen: Nasopharyngeal Swab; Nasopharyngeal(NP) swabs in vial transport medium  Result Value Ref Range Status   SARS Coronavirus 2 by RT PCR NEGATIVE NEGATIVE Final    Comment: (NOTE) SARS-CoV-2 target nucleic acids are NOT DETECTED.  The SARS-CoV-2 RNA is generally detectable in upper respiratory specimens during the acute phase of infection. The lowest concentration of SARS-CoV-2 viral copies this assay can detect is 138 copies/mL. A negative result does not preclude SARS-Cov-2 infection and should not be used as the sole basis for treatment or other patient management decisions. A negative result may occur with  improper specimen collection/handling, submission of specimen other than nasopharyngeal swab, presence of viral mutation(s) within the areas targeted by this assay, and inadequate number of viral copies(<138 copies/mL). A negative result must be combined with clinical observations, patient history, and epidemiological information. The expected result is Negative.  Fact Sheet for Patients:  EntrepreneurPulse.com.au  Fact Sheet for Healthcare Providers:  IncredibleEmployment.be  This test is no t yet approved or cleared by the [place] FDA and  has been authorized for detection and/or diagnosis of SARS-CoV-2 by FDA under an Emergency Use Authorization (EUA). This EUA will remain  in effect (meaning this test can be used) for the duration of the COVID-19 declaration under Section 564(b)(1) of the Act,  21 U.S.C.section 360bbb-3(b)(1), unless the authorization is terminated  or revoked sooner.       Influenza A by PCR NEGATIVE NEGATIVE Final   Influenza B by PCR NEGATIVE NEGATIVE Final    Comment: (NOTE) The Xpert Xpress SARS-CoV-2/FLU/RSV plus assay is intended as an aid in the diagnosis of influenza from Nasopharyngeal swab specimens and should not be used as a sole basis for treatment. Nasal washings and aspirates are unacceptable for Xpert Xpress SARS-CoV-2/FLU/RSV testing.  Fact Sheet for Patients: EntrepreneurPulse.com.au  Fact Sheet for Healthcare Providers: IncredibleEmployment.be  This test is not yet approved or cleared by the [place] FDA and has been authorized for detection and/or diagnosis of SARS-CoV-2 by FDA under an Emergency Use Authorization (EUA). This EUA will remain in effect (meaning this test can be used) for the duration of the COVID-19 declaration under Section 564(b)(1) of the Act, 21 U.S.C. section 360bbb-3(b)(1), unless the authorization is terminated or revoked.  Performed at [facility], [address] [place], [place] [address]   Blood culture (routine x 2)     Status: None (Preliminary  result)   Collection Time: 11/10/21  1:57 PM   Specimen: BLOOD  Result Value Ref Range Status   Specimen Description BLOOD CENTRAL LINE  Final   Special Requests   Final    BOTTLES DRAWN AEROBIC AND ANAEROBIC Blood Culture results may not be optimal due to an excessive volume of blood received in culture bottles   Culture  Setup Time NO ORGANISMS SEEN ANAEROBIC BOTTLE ONLY  Final   Culture   Final    NO GROWTH < 24 HOURS Performed at [facility], [address], [place], [place] [address]    Report Status PENDING  Incomplete  Blood culture (routine x 2)     Status: None (Preliminary result)   Collection Time: 11/10/21  1:57 PM   Specimen: BLOOD  Result Value Ref Range Status   Specimen  Description BLOOD CENTRAL LINE  Final   Special Requests   Final    BOTTLES DRAWN AEROBIC AND ANAEROBIC Blood Culture results may not be optimal due to an excessive volume of blood received in culture bottles   Culture   Final    NO GROWTH < 24 HOURS Performed at [facility], [address], [place], [place] [address]    Report Status PENDING  Incomplete         Radiology Studies: DG Abd 1 View  Result Date: 11/10/2021 CLINICAL DATA:  Generalized weakness. History of stage IV gastric cancer. EXAM: ABDOMEN - 1 VIEW COMPARISON:  None. FINDINGS: The bowel gas pattern is normal. No radio-opaque calculi or other significant radiographic abnormality are seen. IMPRESSION: No abnormal bowel dilatation is noted. Electronically Signed   By: Marijo Conception M.D.   On: 11/10/2021 14:56   CT ABDOMEN PELVIS W CONTRAST  Result Date: 11/10/2021 CLINICAL DATA:  Nausea/vomiting weakness EXAM: CT ABDOMEN AND PELVIS WITH CONTRAST TECHNIQUE: Multidetector CT imaging of the abdomen and pelvis was performed using the standard protocol following bolus administration of intravenous contrast. RADIATION DOSE REDUCTION: This exam was performed according to the departmental dose-optimization program which includes automated exposure control, adjustment of the mA and/or kV according to patient size and/or use of iterative reconstruction technique. CONTRAST:  55mL OMNIPAQUE IOHEXOL 300 MG/ML  SOLN COMPARISON:  CT 09/19/2021 FINDINGS: Lower chest: There is a new moderate size right pleural effusion with adjacent basilar atelectasis. Hepatobiliary: Scalloping of the liver parenchymal contours related to malignant ascites. The gallbladder is unremarkable. Pancreas: Unremarkable. No pancreatic ductal dilatation or surrounding inflammatory changes. Spleen: Normal in size without focal abnormality. Adrenals/Urinary Tract: Adrenal glands are unremarkable. Kidneys are normal, without renal calculi, focal lesion, or  hydronephrosis. The bladder is mildly distended but unremarkable. Stomach/Bowel: No evidence of bowel obstruction. Displacement of the viscera due to viscous/malignant ascites. The appendix appears decompressed. Known colon cancer primary is not well visualized by CT but possibly representative on coronal image 53 where there is focal wall thickening near the rectosigmoid junction. Vascular/Lymphatic: Aortoiliac atherosclerotic calcifications. No AAA. Reproductive: Unremarkable. Other: Cachectic appearance. Large volume abdominopelvic exudate of ascites, increased from prior exam, with diffuse peritoneal carcinomatosis and omental caking. Increased thickening of peritoneal metastases along the right upper quadrant, measuring 1.5 cm in thickness, previously 0.9 cm (series 2, image 22). Increased peritoneal soft tissue density in the pelvis. Musculoskeletal: Prior L4-S1 lumbar fusion. Moderate severe degenerative disc disease at L2-L3. Unchanged chronic right pelvic defect. IMPRESSION: New moderate size right pleural effusion with adjacent basilar atelectasis. Worsening diffuse peritoneal carcinomatosis. Increased, large volume malignant ascites. No evidence of  bowel obstruction. Electronically Signed   By: Maurine Simmering M.D.   On: 11/10/2021 16:56   US Venous Img Lower Bilateral (DVT)  Result Date: 11/10/2021 CLINICAL DATA:  Pain EXAM: Bilateral LOWER EXTREMITY VENOUS DOPPLER ULTRASOUND TECHNIQUE: Gray-scale sonography with compression, as well as color and duplex ultrasound, were performed to evaluate the deep venous system(s) from the level of the common femoral vein through the popliteal and proximal calf veins. COMPARISON:  None. FINDINGS: VENOUS Normal compressibility of the common femoral, superficial femoral, and popliteal veins, as well as the visualized calf veins on both sides. Visualized portions of profunda femoral vein and great saphenous vein unremarkable. No filling defects to suggest DVT on  grayscale or color Doppler imaging. Doppler waveforms show normal direction of venous flow, normal respiratory plasticity and response to augmentation. . OTHER None. Limitations: none IMPRESSION: There is no evidence of deep venous thrombosis in both lower extremities. Electronically Signed   By: Elmer Picker M.D.   On: 11/10/2021 17:41   DG Chest Port 1 View  Result Date: 11/10/2021 CLINICAL DATA:  Weakness EXAM: PORTABLE CHEST 1 VIEW COMPARISON:  Chest x-ray dated August 03, 2019 FINDINGS: Cardiac and mediastinal contours are within normal limits. Right chest wall port with tip positioned near the superior cavoatrial junction. New right basilar opacity. Small right pleural effusion. IMPRESSION: 1. New right basilar opacity, concerning for infection or aspiration. 2. Small right pleural effusion. Electronically Signed   By: Yetta Glassman M.D.   On: 11/10/2021 12:39        Scheduled Meds:  Chlorhexidine Gluconate Cloth  6 each Topical Daily   doxepin  25 mg Oral QHS   enoxaparin (LOVENOX) injection  40 mg Subcutaneous Q24H   gabapentin  1,800 mg Oral QHS   morphine  60 mg Oral Q12H   Continuous Infusions:  dextrose 5% lactated ringers 50 mL/hr at 11/10/21 1915   levofloxacin (LEVAQUIN) IV       LOS: 0 days    Time spent: 31 mins    Wyvonnia Dusky, MD [facility] Pager [phone]  If 7PM-7AM, please contact night-coverage 11/11/2021, 7:26 AM

## 2021-11-11 NOTE — Evaluation (Signed)
Clinical/Bedside Swallow Evaluation Patient Details  Name: Roberto Collins MRN: [id number] Date of Birth: 01/10/1951  Today's Date: 11/11/2021 Time: SLP Start Time (ACUTE ONLY): 1145 SLP Stop Time (ACUTE ONLY): 1240 SLP Time Calculation (min) (ACUTE ONLY): 55 min  Past Medical History:  Past Medical History:  Diagnosis Date   Anemia    Asthma    Back pain    Blood transfusion without reported diagnosis    Past Surgical History:  Past Surgical History:  Procedure Laterality Date   BACK SURGERY  2000   COLONOSCOPY WITH PROPOFOL N/A 04/02/2019   Procedure: COLONOSCOPY WITH PROPOFOL;  Surgeon: Virgel Manifold, MD;  Location: [facility] ENDOSCOPY;  Service: Endoscopy;  Laterality: N/A;   COLONOSCOPY WITH PROPOFOL N/A 04/20/2020   Procedure: COLONOSCOPY WITH PROPOFOL;  Surgeon: Virgel Manifold, MD;  Location: [facility] ENDOSCOPY;  Service: Endoscopy;  Laterality: N/A;   COLONOSCOPY WITH PROPOFOL N/A 07/28/2021   Procedure: COLONOSCOPY WITH PROPOFOL;  Surgeon: Virgel Manifold, MD;  Location: [facility] ENDOSCOPY;  Service: Endoscopy;  Laterality: N/A;   ESOPHAGOGASTRODUODENOSCOPY Left 03/31/2019   Procedure: ESOPHAGOGASTRODUODENOSCOPY (EGD);  Surgeon: Virgel Manifold, MD;  Location: [facility] ENDOSCOPY;  Service: Endoscopy;  Laterality: Left;   GIVENS CAPSULE STUDY N/A 04/02/2019   Procedure: GIVENS CAPSULE STUDY;  Surgeon: Virgel Manifold, MD;  Location: [facility] ENDOSCOPY;  Service: Endoscopy;  Laterality: N/A;   IR IMAGING GUIDED PORT INSERTION  10/27/2021   XI ROBOT ASSISTED TRANSANAL RESECTION N/A 06/25/2019   Procedure: XI ROBOT ASSISTED PARTIAL PROCTECTOMY OF RECTAL MASS USING TAMIS;  Surgeon: [name], MD;  Location: [facility] ORS;  Service: General;  Laterality: N/A;   HPI:  Per admitting H&P "Roberto Collins is a 71 y.o. male with history of rectal cancer stage IV with peritoneal carcinomatosis had a chemotherapy on November 01, 2021 has been feeling increasingly weak over the last few days with  poor appetite unable to eat anything because of poor appetite and mild nausea denies any abdominal pain.  He denies some productive cough denies any shortness of breath or chest pain.   " Pt was admitted for concerns of weakness, deconditioning, and possible aspiration.    Assessment / Plan / Recommendation  Clinical Impression  Pt presents with mild to moderate oral dysphagia but no s/s of aspiration with any tested consistency. Pt noted to have no upper teeth and only one bottom tooth. He reports that he has dentures at home and mostly wears them during meals but can chew without them as well. Pt tolerated several sips of water and bites of puree without difficulty. Given a graham cracker, he needed extra time to chew and clear food. Vocal quality remained clear and laryngeal elevation appeared adequate. Pt does report that food often gets stuck (pointed to esophagus) although repoirts it did not ocurr during this assessment. Pt had a congested cough prior to any Po's and at the completion of exam. Rec Dys 2 finely chopped diet with thin liquids. Small, more frequent meals as Pt fatigues easily. Encouraged him to take one thing off the tray and save it for a few hrs later. Family member in the room reported Pt has an apt with ENT later this month as his tongue was deviated to the right a few weeks ago and he could not previously move it laterally, although this seems to be resolved now. ST to follow up with toleration of diet. Rec Pt alternate liquids with solids and avoid dry solids by adding mpisture to most foods. SLP Visit  Diagnosis: Dysphagia, oropharyngeal phase (R13.12)    Aspiration Risk  Mild aspiration risk    Diet Recommendation Dysphagia 2 (Fine chop)   Liquid Administration via: Cup;Straw Medication Administration: Crushed with puree Supervision: Intermittent supervision to cue for compensatory strategies Compensations: Minimize environmental distractions;Slow rate;Small  sips/bites;Follow solids with liquid Postural Changes: Seated upright at 90 degrees;Remain upright for at least 30 minutes after po intake    Other  Recommendations Recommended Consults: Consider esophageal assessment (if Pt continues to c/o food getting stuck)    Recommendations for follow up therapy are one component of a multi-disciplinary discharge planning process, led by the attending physician.  Recommendations may be updated based on patient status, additional functional criteria and insurance authorization.  Follow up Recommendations   ST not likely needed     Assistance Recommended at Discharge Set up Supervision/Assistance  Functional Status Assessment Patient has had a recent decline in their functional status and demonstrates the ability to make significant improvements in function in a reasonable and predictable amount of time.  Frequency and Duration Other (Comment)  1 week       Prognosis   good     Swallow Study   General Date of Onset: 11/10/21 HPI: Per admitting H&P "Roberto Collins is a 71 y.o. 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  He denies some productive cough denies any shortness of breath or chest pain.   " Pt was admitted for concerns of weakness, deconditioning, and possible aspiration. Type of Study: Bedside Swallow Evaluation Diet Prior to this Study: Other (Comment) (clear liquid) Respiratory Status: Room air;Other (comment) (Sats 90 pecent when checked by assistant. Nsg also made aware) History of Recent Intubation: No Behavior/Cognition: Alert;Pleasant mood Oral Cavity Assessment: Within Functional Limits Oral Care Completed by SLP: No Oral Cavity - Dentition: Poor condition;Missing dentition;Other (Comment) (Has  dentures at home) Vision: Functional for self-feeding Self-Feeding Abilities: Able to feed self Patient Positioning: Upright in chair Baseline Vocal Quality: Normal;Low vocal intensity;Hoarse Volitional Cough: Strong;Wet;Congested    Oral/Motor/Sensory Function Overall Oral Motor/Sensory Function: Within functional limits   Ice Chips Ice chips: Within functional limits   Thin Liquid Thin Liquid: Within functional limits Presentation: Cup;Spoon;Straw    Nectar Thick Nectar Thick Liquid: Not tested   Honey Thick Honey Thick Liquid: Not tested   Puree Puree: Within functional limits   Solid     Solid: Impaired Presentation: Self Fed Oral Phase Impairments: Impaired mastication Oral Phase Functional Implications: Prolonged oral transit;Impaired mastication      Lucila Maine 11/11/2021,12:47 PM

## 2021-11-11 NOTE — Evaluation (Addendum)
Physical Therapy Evaluation Patient Details Name: Roberto Collins MRN: [id number] DOB: 06/27/1951 Today's Date: 11/11/2021  History of Present Illness  Pt is a 71 y/o M admitted on 11/10/21 after presenting with c/c of weakness; likely 2/2 deconditioning, poor appetitie & possible aspiration PNA. 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 Prior to admission pt was independent without AD, denies falls, living alone with PRN assistance from Roberto Collins for cooking/cleaning, but otherwise sedentary throughout most of the day.  On this date, pt is mod I with bed mobility, CGA<>min assist for sit>stand & min assist for gait without AD to ambulate around small nurses pod. Pt did require 1 standing rest break 2/2 SOB but pt reports this is baseline. Discussed recommendation of HHPT & supervision upon d/c, emphasizing need for CGA for mobility with pt & family voicing understanding. They also report pt was receiving HHPT services prior to admission. (Pt's sister hopes to get house cleaned prior to pt's return home as pt has bed bugs - expecting exterminator on Monday). Will continue to follow pt acutely to address balance, endurance, strengthening, & gait with LRAD.      Recommendations for follow up therapy are one component of a multi-disciplinary discharge planning process, led by the attending physician.  Recommendations may be updated based on patient status, additional functional criteria and insurance authorization.  Follow Up Recommendations Home health PT    Assistance Recommended at Discharge Intermittent Supervision/Assistance  Patient can return home with the following  Assist for transportation;Direct supervision/assist for financial management;Assistance with cooking/housework;Help with stairs or ramp for entrance;A little help with walking and/or transfers;A  little help with bathing/dressing/bathroom    Equipment Recommendations None recommended by PT  Recommendations for Other Services    OT consult   Functional Status Assessment Patient has had a recent decline in their functional status and demonstrates the ability to make significant improvements in function in a reasonable and predictable amount of time.     Precautions / Restrictions Precautions Precautions: Fall Restrictions Weight Bearing Restrictions: No      Mobility  Bed Mobility Overal bed mobility: Modified Independent             General bed mobility comments: supine>sit with mod I with HOB slightly elevated, use of bed rails PRN    Transfers Overall transfer level: Needs assistance Equipment used: None Transfers: Sit to/from Stand, Bed to chair/wheelchair/BSC Sit to Stand: Min guard   Step pivot transfers: Min assist (no AD)            Ambulation/Gait Ambulation/Gait assistance: Min assist Gait Distance (Feet): 100 Feet Assistive device: None Gait Pattern/deviations: Decreased step length - right, Decreased step length - left, Decreased stride length Gait velocity: slightly decreased     General Gait Details: 1 standing rest break 2/2 SOB with 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    Modified Rankin (Stroke Patients Only)       Balance Overall balance assessment: Needs assistance Sitting-balance support: Feet supported       Standing balance support: During functional activity, No upper extremity supported Standing balance-Leahy Scale: Fair                               Pertinent Vitals/Pain Pain Assessment Pain  Assessment: 0-10 Pain Score: 5  Pain Location: back Pain Descriptors / Indicators: Discomfort Pain Intervention(s): Limited activity within patient's tolerance, Monitored during session    Home Living Family/patient expects to be discharged to:: Private residence Living  Arrangements: Alone Available Help at Discharge: Family;Available PRN/intermittently Type of Home: House Home Access: Stairs to enter Entrance Stairs-Rails: Left Entrance Stairs-Number of Steps: 2   Home Layout: One level Home Equipment: None Additional Comments: Sister in law Roberto Collins) present reporting family/friends will be installing a ramp this coming Tuesday as pt's porch/steps are old & unstable & need to be replaced.    Prior Function Prior Level of Function : Independent/Modified Independent             Mobility Comments: Pt reports independence without AD, denies falls, but sister-in-law reports pt is sedentary most of the day. ADLs Comments: Family brings by a biscuit once in awhile. Sister in law assisting with cleaning/cooking.     Hand Dominance        Extremity/Trunk Assessment   Upper Extremity Assessment Upper Extremity Assessment: Generalized weakness    Lower Extremity Assessment Lower Extremity Assessment: Generalized weakness       Communication   Communication: No difficulties  Cognition Arousal/Alertness: Awake/alert Behavior During Therapy: WFL for tasks assessed/performed Overall Cognitive Status: Within Functional Limits for tasks assessed                                          General Comments General comments (skin integrity, edema, etc.): max HR 129 bpm, pt reports it feels good to sit up in recliner    Exercises     Assessment/Plan    PT Assessment Patient needs continued PT services  PT Problem List Decreased strength;Decreased mobility;Decreased activity tolerance;Decreased balance;Decreased knowledge of use of DME;Cardiopulmonary status limiting activity       PT Treatment Interventions DME instruction;Therapeutic exercise;Balance training;Gait training;Stair training;Neuromuscular re-education;Functional mobility training;Therapeutic activities;Patient/family education    PT Goals (Current goals can be  found in the Care Plan section)  Acute Rehab PT Goals Patient Stated Goal: get better PT Goal Formulation: With patient/family Time For Goal Achievement: 11/25/21 Potential to Achieve Goals: Good    Frequency Min 2X/week     Co-evaluation               AM-PAC PT "6 Clicks" Mobility  Outcome Measure Help needed turning from your back to your side while in a flat bed without using bedrails?: None Help needed moving from lying on your back to sitting on the side of a flat bed without using bedrails?: None Help needed moving to and from a bed to a chair (including a wheelchair)?: A Little Help needed standing up from a chair using your arms (e.g., wheelchair or bedside chair)?: A Little Help needed to walk in hospital room?: A Little Help needed climbing 3-5 steps with a railing? : A Little 6 Click Score: 20    End of Session Equipment Utilized During Treatment: Gait belt Activity Tolerance: Patient tolerated treatment well Patient left: in chair;with chair alarm set;with nursing/sitter in room;with call bell/phone within reach;with family/visitor present Nurse Communication: Mobility status PT Visit Diagnosis: Unsteadiness on feet (R26.81);Muscle weakness (generalized) (M62.81)    Time: 8115-7262 PT Time Calculation (min) (ACUTE ONLY): 19 min   Charges:   PT Evaluation $PT Eval Low Complexity: 1 Low  Roberto Collins, PT, DPT 11/11/21, 10:22 AM   Roberto Collins 11/11/2021, 10:19 AM

## 2021-11-11 NOTE — Progress Notes (Signed)
Initial Nutrition Assessment RD working remotely.  DOCUMENTATION CODES:   Underweight  INTERVENTION:  - will increase Ensure Plus High Protein from BID to TID, each supplement provides 350 kcal and 20 grams of protein. - will order 30 ml Prosource Plus BID, each supplement provides 100 kcal and 15 grams protein.  - will order multivitamin with minerals daily - complete NFPE when feasible.    NUTRITION DIAGNOSIS:   Increased nutrient needs related to acute illness, chronic illness, cancer and cancer related treatments as evidenced by estimated needs.  GOAL:   Patient will meet greater than or equal to 90% of their needs  MONITOR:   PO intake, Supplement acceptance, Labs, Weight trends  REASON FOR ASSESSMENT:   Malnutrition Screening Tool, Consult Assessment of nutrition requirement/status  ASSESSMENT:   71 y.o. male with history of stage 4 rectal cancer with peritoneal carcinomatosis on chemo (last: 11/01/21), anemia, and asthma. He presented to the ED due to feeling increasingly weak for several days, poor appetite, mild nausea. Admitted with working dx of weakness.  Unable to reach patient on the phone this afternoon.   He consumed 100% of breakfast this AM on CLD (402 kcal and 3 grams protein). Diet was then advanced to Dysphagia 2, thin liquids at 1255. Lunch has been printed but not yet delivered (meal provides 570 kcal and 18 grams protein).   Patient was seen by [place] RD on 11/08/21. At that time patient reported decreased appetite x2 months or more, prior to starting cancer treatment. He lives alone. His sister-in-law was with him during the visit and reported that patient has become very weak and has difficulty preparing things to eat d/t this. He has tried oral nutrition supplements, but finances do not always permit him to have them available. His sister-in-law planned to purchase him frozen meals and a shelf to have in the living room (where he spends most of his  time) to put snacks on. He does experience early satiety and has some throat pain.   [place] RD was able to complete NFPE and identified patient with severe fat and severe muscle depletions. This would indicate severe malnutrition.   Weight yesterday was 122 lb and weight has been fairly stable x3 months with weight of 129 lb on 07/28/21. This indicates 7 lb weight loss (5% body weight).   SLP saw patient for swallow eval at bedside this afternoon.    Labs reviewed; creatinine: 0.58 mg/dl, Ca: 7.9 mg/dl. Medications reviewed. IVF; NS @ 75 ml/hr.     NUTRITION - FOCUSED PHYSICAL EXAM:  RD working remotely.  Diet Order:   Diet Order             DIET DYS 2 Room service appropriate? Yes; Fluid consistency: Thin  Diet effective now                   EDUCATION NEEDS:   No education needs have been identified at this time  Skin:  Skin Assessment: Reviewed RN Assessment  Last BM:  PTA/unknown  Height:   Ht Readings from Last 1 Encounters:  11/10/21 5\' 10"  (1.778 m)    Weight:   Wt Readings from Last 1 Encounters:  11/10/21 55.3 kg     BMI:  Body mass index is 17.51 kg/m.   Estimated Nutritional Needs:  Kcal:  2200-2400 kcal Protein:  110-125 grams Fluid:  >/= 1.8 L/day      Jarome Matin, MS, RD, LDN Inpatient Clinical Dietitian RD pager #  available in [facility]  After hours/weekend pager # available in [facility]

## 2021-11-12 DIAGNOSIS — E43 Unspecified severe protein-calorie malnutrition: Secondary | ICD-10-CM | POA: Diagnosis not present

## 2021-11-12 DIAGNOSIS — J690 Pneumonitis due to inhalation of food and vomit: Secondary | ICD-10-CM | POA: Diagnosis not present

## 2021-11-12 DIAGNOSIS — R531 Weakness: Secondary | ICD-10-CM | POA: Diagnosis not present

## 2021-11-12 MED ORDER — GUAIFENESIN ER 600 MG PO TB12
1200.0000 mg | ORAL_TABLET | Freq: Two times a day (BID) | ORAL | Status: DC
  Administered 2021-11-12 – 2021-11-17 (×11): 1200 mg via ORAL
  Filled 2021-11-12 (×12): qty 2

## 2021-11-12 MED ORDER — ALUM & MAG HYDROXIDE-SIMETH 200-200-20 MG/5ML PO SUSP
30.0000 mL | Freq: Once | ORAL | Status: AC
  Administered 2021-11-12: 30 mL via ORAL
  Filled 2021-11-12: qty 30

## 2021-11-12 MED ORDER — PANTOPRAZOLE SODIUM 40 MG PO TBEC
40.0000 mg | DELAYED_RELEASE_TABLET | Freq: Every day | ORAL | Status: DC
  Administered 2021-11-12 – 2021-11-17 (×6): 40 mg via ORAL
  Filled 2021-11-12 (×6): qty 1

## 2021-11-12 MED ORDER — LORATADINE 10 MG PO TABS
10.0000 mg | ORAL_TABLET | Freq: Every day | ORAL | Status: DC
  Administered 2021-11-12 – 2021-11-17 (×6): 10 mg via ORAL
  Filled 2021-11-12 (×6): qty 1

## 2021-11-12 MED ORDER — FUROSEMIDE 10 MG/ML IJ SOLN
40.0000 mg | Freq: Once | INTRAMUSCULAR | Status: AC
  Administered 2021-11-12: 15:00:00 40 mg via INTRAVENOUS
  Filled 2021-11-12: qty 4

## 2021-11-12 NOTE — Progress Notes (Signed)
PROGRESS NOTE    Roberto Collins  GYI:[id number] DOB: [birth year] DOA: 11/10/2021 PCP: [name], PA-C   Assessment & Plan:   Principal Problem:   Weakness Active Problems:   Pulmonary emphysema ([place])   Peritoneal carcinomatosis ([place])   Pneumonia   Possible aspiration pneumonia: improving. 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 Recent chemo.   Bed bugs: s/p permethrin. Continue w/ contact precautions   HLD: not take a statin currently   Severe protein calorie malnutrition: continue on nutritional supplement   Normocytic anemia: likely secondary to recent chemo. H&H are labile  Thrombocytopenia: likely secondary to recent chemo. Will continue to monitor   COPD: w/o exacerbation. Continue on bronchodilators   DVT prophylaxis: lovenox  Code Status: DNR Family Communication:  Disposition Plan: likely d/c back home w/ HH   Level of care: Med-Surg  Status is: Inpatient Remains inpatient appropriate because: severity of illness    Consultants:    Procedures:   Antimicrobials:    Subjective: Pt c/o malaise   Objective: Vitals:   11/11/21 1457 11/11/21 1458 11/11/21 2101 11/12/21 0409  BP: 114/85  108/74 117/74  Pulse: 88  93 80  Resp: 20  16 16   Temp:  98 F (36.7 C) 98.7 F (37.1 C) 98.9 F (37.2 C)  TempSrc:   Oral   SpO2:   97% 98%  Weight:      Height:        Intake/Output Summary (Last 24 hours) at 11/12/2021 0723 Last data filed at 11/12/2021 0400 Gross per 24 hour  Intake 2862.22 ml  Output 650 ml  Net 2212.22 ml   Filed Weights   11/10/21 1118  Weight: 55.3 kg    Examination:  General exam: Appears disheveled and cachectic Respiratory system: diminished breath sounds b/l  Cardiovascular system: S1/S2+. No rubs or gallops  Gastrointestinal system: abd is soft, NT, ND & hypoactive bowel sounds  Central  nervous system: alert and oriented. Moves all extremities  Psychiatry: Judgement and insight appears normal. Flat mood and affect     Data Reviewed: I have personally reviewed following labs and imaging studies  CBC: Recent Labs  Lab 11/08/21 0818 11/10/21 1244 11/10/21 1528 11/11/21 0559  WBC 7.2 5.9 4.7 4.2  NEUTROABS 6.2  --   --   --   HGB 10.8* 10.3* 9.5* 9.9*  HCT 32.7* 31.0* 28.1* 29.6*  MCV 86.7 87.8 87.0 87.8  PLT 166 170 149* 350*   Basic Metabolic Panel: Recent Labs  Lab 11/08/21 0818 11/10/21 1244 11/10/21 1528 11/11/21 0559  NA 135 137  --  136  K 3.5 3.4*  --  3.6  CL 101 102  --  102  CO2 26 22  --  27  GLUCOSE 109* 96  --  98  BUN 25* 20  --  18  CREATININE 0.64 0.32* 0.39* 0.58*  CALCIUM 8.2* 7.9*  --  7.9*   GFR: Estimated Creatinine Clearance: 68.2 mL/min (A) (by C-G formula based on SCr of 0.58 mg/dL (L)). Liver Function Tests: Recent Labs  Lab 11/08/21 0818 11/10/21 1244 11/11/21 0559  AST 24 19 18   ALT 18 16 15   ALKPHOS 64 58 54  BILITOT 0.3 0.4 0.4  PROT 6.1* 5.7* 5.4*  ALBUMIN 2.8* 2.6* 2.5*   No results for input(s): LIPASE, AMYLASE in the last 168 hours. No results for input(s): AMMONIA in the last 168 hours.  Coagulation Profile: No results for input(s): INR, PROTIME in the last 168 hours. Cardiac Enzymes: No results for input(s): CKTOTAL, CKMB, CKMBINDEX, TROPONINI in the last 168 hours. BNP (last 3 results) No results for input(s): PROBNP in the last 8760 hours. HbA1C: No results for input(s): HGBA1C in the last 72 hours. CBG: No results for input(s): GLUCAP in the last 168 hours. Lipid Profile: No results for input(s): CHOL, HDL, LDLCALC, TRIG, CHOLHDL, LDLDIRECT in the last 72 hours. Thyroid Function Tests: No results for input(s): TSH, T4TOTAL, FREET4, T3FREE, THYROIDAB in the last 72 hours. Anemia Panel: No results for input(s): VITAMINB12, FOLATE, FERRITIN, TIBC, IRON, RETICCTPCT in the last 72 hours. Sepsis  Labs: Recent Labs  Lab 11/10/21 1357  LATICACIDVEN 0.7    Recent Results (from the past 240 hour(s))  Resp Panel by RT-PCR (Flu A&B, Covid) Nasopharyngeal Swab     Status: None   Collection Time: 11/10/21 12:44 PM   Specimen: Nasopharyngeal Swab; Nasopharyngeal(NP) swabs in vial transport medium  Result Value Ref Range Status   SARS Coronavirus 2 by RT PCR NEGATIVE NEGATIVE Final    Comment: (NOTE) SARS-CoV-2 target nucleic acids are NOT DETECTED.  The SARS-CoV-2 RNA is generally detectable in upper respiratory specimens during the acute phase of infection. The lowest concentration of SARS-CoV-2 viral copies this assay can detect is 138 copies/mL. A negative result does not preclude SARS-Cov-2 infection and should not be used as the sole basis for treatment or other patient management decisions. A negative result may occur with  improper specimen collection/handling, submission of specimen other than nasopharyngeal swab, presence of viral mutation(s) within the areas targeted by this assay, and inadequate number of viral copies(<138 copies/mL). A negative result must be combined with clinical observations, patient history, and epidemiological information. The expected result is Negative.  Fact Sheet for Patients:  EntrepreneurPulse.com.au  Fact Sheet for Healthcare Providers:  IncredibleEmployment.be  This test is no t yet approved or cleared by the [place] FDA and  has been authorized for detection and/or diagnosis of SARS-CoV-2 by FDA under an Emergency Use Authorization (EUA). This EUA will remain  in effect (meaning this test can be used) for the duration of the COVID-19 declaration under Section 564(b)(1) of the Act, 21 U.S.C.section 360bbb-3(b)(1), unless the authorization is terminated  or revoked sooner.       Influenza A by PCR NEGATIVE NEGATIVE Final   Influenza B by PCR NEGATIVE NEGATIVE Final    Comment: (NOTE) The  Xpert Xpress SARS-CoV-2/FLU/RSV plus assay is intended as an aid in the diagnosis of influenza from Nasopharyngeal swab specimens and should not be used as a sole basis for treatment. Nasal washings and aspirates are unacceptable for Xpert Xpress SARS-CoV-2/FLU/RSV testing.  Fact Sheet for Patients: EntrepreneurPulse.com.au  Fact Sheet for Healthcare Providers: IncredibleEmployment.be  This test is not yet approved or cleared by the [place] FDA and has been authorized for detection and/or diagnosis of SARS-CoV-2 by FDA under an Emergency Use Authorization (EUA). This EUA will remain in effect (meaning this test can be used) for the duration of the COVID-19 declaration under Section 564(b)(1) of the Act, 21 U.S.C. section 360bbb-3(b)(1), unless the authorization is terminated or revoked.  Performed at [facility], [address] [place], [place] [address]   Blood culture (routine x 2)     Status: None (Preliminary result)   Collection Time: 11/10/21  1:57 PM   Specimen: BLOOD  Result Value Ref Range Status   Specimen Description BLOOD CENTRAL LINE  Final   Special Requests   Final    BOTTLES DRAWN AEROBIC AND ANAEROBIC Blood Culture results may not be optimal due to an excessive volume of blood received in culture bottles   Culture   Final    NO GROWTH 2 DAYS Performed at [facility], [address] [place], [place] [address]    Report Status PENDING  Incomplete  Blood culture (routine x 2)     Status: None (Preliminary result)   Collection Time: 11/10/21  1:57 PM   Specimen: BLOOD  Result Value Ref Range Status   Specimen Description BLOOD CENTRAL LINE  Final   Special Requests   Final    BOTTLES DRAWN AEROBIC AND ANAEROBIC Blood Culture results may not be optimal due to an excessive volume of blood received in culture bottles   Culture   Final    NO GROWTH 2 DAYS Performed at [facility], [address], [place],  [address]    Report Status PENDING  Incomplete         Radiology Studies: DG Abd 1 View  Result Date: 11/10/2021 CLINICAL DATA:  Generalized weakness. History of stage IV gastric cancer. EXAM: ABDOMEN - 1 VIEW COMPARISON:  None. FINDINGS: The bowel gas pattern is normal. No radio-opaque calculi or other significant radiographic abnormality are seen. IMPRESSION: No abnormal bowel dilatation is noted. Electronically Signed   By: Marijo Conception M.D.   On: 11/10/2021 14:56   CT ABDOMEN PELVIS W CONTRAST  Result Date: 11/10/2021 CLINICAL DATA:  Nausea/vomiting weakness EXAM: CT ABDOMEN AND PELVIS WITH CONTRAST TECHNIQUE: Multidetector CT imaging of the abdomen and pelvis was performed using the standard protocol following bolus administration of intravenous contrast. RADIATION DOSE REDUCTION: This exam was performed according to the departmental dose-optimization program which includes automated exposure control, adjustment of the mA and/or kV according to patient size and/or use of iterative reconstruction technique. CONTRAST:  42mL OMNIPAQUE IOHEXOL 300 MG/ML  SOLN COMPARISON:  CT 09/19/2021 FINDINGS: Lower chest: There is a new moderate size right pleural effusion with adjacent basilar atelectasis. Hepatobiliary: Scalloping of the liver parenchymal contours related to malignant ascites. The gallbladder is unremarkable. Pancreas: Unremarkable. No pancreatic ductal dilatation or surrounding inflammatory changes. Spleen: Normal in size without focal abnormality. Adrenals/Urinary Tract: Adrenal glands are unremarkable. Kidneys are normal, without renal calculi, focal lesion, or hydronephrosis. The bladder is mildly distended but unremarkable. Stomach/Bowel: No evidence of bowel obstruction. Displacement of the viscera due to viscous/malignant ascites. The appendix appears decompressed. Known colon cancer primary is not well visualized by CT but possibly representative on  coronal image 53 where there is focal wall thickening near the rectosigmoid junction. Vascular/Lymphatic: Aortoiliac atherosclerotic calcifications. No AAA. Reproductive: Unremarkable. Other: Cachectic appearance. Large volume abdominopelvic exudate of ascites, increased from prior exam, with diffuse peritoneal carcinomatosis and omental caking. Increased thickening of peritoneal metastases along the right upper quadrant, measuring 1.5 cm in thickness, previously 0.9 cm (series 2, image 22). Increased peritoneal soft tissue density in the pelvis. Musculoskeletal: Prior L4-S1 lumbar fusion. Moderate severe degenerative disc disease at L2-L3. Unchanged chronic right pelvic defect. IMPRESSION: New moderate size right pleural effusion with adjacent basilar atelectasis. Worsening diffuse peritoneal carcinomatosis. Increased, large volume malignant ascites. No evidence of bowel obstruction. Electronically Signed   By: Maurine Simmering M.D.   On: 11/10/2021 16:56   US Venous Img Lower Bilateral (DVT)  Result Date: 11/10/2021 CLINICAL DATA:  Pain EXAM: Bilateral LOWER EXTREMITY VENOUS DOPPLER ULTRASOUND TECHNIQUE: Gray-scale sonography with  compression, as well as color and duplex ultrasound, were performed to evaluate the deep venous system(s) from the level of the common femoral vein through the popliteal and proximal calf veins. COMPARISON:  None. FINDINGS: VENOUS Normal compressibility of the common femoral, superficial femoral, and popliteal veins, as well as the visualized calf veins on both sides. Visualized portions of profunda femoral vein and great saphenous vein unremarkable. No filling defects to suggest DVT on grayscale or color Doppler imaging. Doppler waveforms show normal direction of venous flow, normal respiratory plasticity and response to augmentation. . OTHER None. Limitations: none IMPRESSION: There is no evidence of deep venous thrombosis in both lower extremities. Electronically Signed   By: Elmer Picker M.D.   On: 11/10/2021 17:41   DG Chest Port 1 View  Result Date: 11/10/2021 CLINICAL DATA:  Weakness EXAM: PORTABLE CHEST 1 VIEW COMPARISON:  Chest x-ray dated August 03, 2019 FINDINGS: Cardiac and mediastinal contours are within normal limits. Right chest wall port with tip positioned near the superior cavoatrial junction. New right basilar opacity. Small right pleural effusion. IMPRESSION: 1. New right basilar opacity, concerning for infection or aspiration. 2. Small right pleural effusion. Electronically Signed   By: Yetta Glassman M.D.   On: 11/10/2021 12:39        Scheduled Meds:  (feeding supplement) PROSource Plus  30 mL Oral BID BM   Chlorhexidine Gluconate Cloth  6 each Topical Daily   cyclobenzaprine  10 mg Oral QHS   doxepin  25 mg Oral QHS   enoxaparin (LOVENOX) injection  40 mg Subcutaneous Q24H   feeding supplement  237 mL Oral TID BM   gabapentin  1,800 mg Oral QHS   morphine  60 mg Oral Q12H   multivitamin with minerals  1 tablet Oral Daily   Continuous Infusions:  sodium chloride 75 mL/hr at 11/12/21 0400   levofloxacin (LEVAQUIN) IV 750 mg (11/11/21 1225)     LOS: 1 day    Time spent: 25 mins    Wyvonnia Dusky, MD [facility] Pager [phone]  If 7PM-7AM, please contact night-coverage 11/12/2021, 7:23 AM

## 2021-11-12 NOTE — Progress Notes (Addendum)
Speech Language Pathology Treatment:    Patient Details Name: Roberto Collins MRN: [id number] DOB: Aug 15, 1951 Today's Date: 11/12/2021 Time: 4097-3532 SLP Time Calculation (min) (ACUTE ONLY): 15 min  Assessment / Plan / Recommendation Clinical Impression  Pt seen for diet tolerance. Pt alert, pleasant, and cooperative. Consuming breakfast upon SLP entrance to room. Pt reported an improvement in globus sensation since diet downgrade to Dysphagia 2, Thin Liquids.   Observed with consistencies from Dysphagia 2, Thin Liquid meal tray with items including diced potatoes, pureed fruit, milk, and juice. Pt demonstrated mildly prolonged, but functional mastication, which is likely secondary to dental status. No overt or subtle s/sx pharyngeal dysphagia.   Per chart review, temp and WBC WNL. No recent chest imaging. Pt on room air.  Recommend continuation of Dysphagia 2 Diet with Thin Liquids and safe swallowing strategies as outlined below.   Consider Registered Dietician consult, if not already following, as pt admitted with poor PO intake and is cachetic in appearance. Concern for pt's ability to meet nutritional needs. Inconsistent PO intake recorded in EMR since admission.   SLP to sign off as pt has no acute SLP needs.   Pt and RN made aware of diet recommendations, safe swallowing strategies/aspiration precautions and SLP POC. Pt verbalized understanding/agreement.     HPI HPI: Per admitting H&P "Roberto Collins is a 71 y.o. 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  He denies some productive cough denies any shortness of breath or chest pain.   " Pt was admitted for concerns of weakness, deconditioning, and possible aspiration.      SLP Plan  All goals met       Recommendations for follow up therapy are one component of a multi-disciplinary discharge planning process, led by the attending physician.  Recommendations may be updated based on patient status, additional functional criteria and insurance authorization.    Recommendations  Diet recommendations: Dysphagia 2 (fine chop);Thin liquid Medication Administration: Crushed with puree Supervision: Patient able to self feed (meal set up) Compensations: Minimize environmental distractions;Slow rate;Small sips/bites;Follow solids with liquid Postural Changes and/or Swallow Maneuvers: Out of bed for meals;Seated upright 90 degrees;Upright 30-60 min after meal                Follow Up Recommendations: No SLP follow up Assistance recommended at discharge: Set up Supervision/Assistance SLP Visit Diagnosis: Dysphagia, oropharyngeal phase (R13.12) Plan: All goals met          Roberto Collins, M.S., [facility] [phone] Roberto Collins)  Roberto Collins  11/12/2021, 10:50 AM

## 2021-11-13 ENCOUNTER — Ambulatory Visit: Payer: Medicare HMO | Admitting: Physician Assistant

## 2021-11-13 DIAGNOSIS — R531 Weakness: Secondary | ICD-10-CM | POA: Diagnosis not present

## 2021-11-13 DIAGNOSIS — E43 Unspecified severe protein-calorie malnutrition: Secondary | ICD-10-CM | POA: Diagnosis not present

## 2021-11-13 DIAGNOSIS — J690 Pneumonitis due to inhalation of food and vomit: Secondary | ICD-10-CM | POA: Diagnosis not present

## 2021-11-13 DIAGNOSIS — Z515 Encounter for palliative care: Secondary | ICD-10-CM | POA: Diagnosis not present

## 2021-11-13 LAB — BASIC METABOLIC PANEL
Anion gap: 5 (ref 5–15)
BUN: 19 mg/dL (ref 8–23)
CO2: 27 mmol/L (ref 22–32)
Calcium: 7.9 mg/dL — ABNORMAL LOW (ref 8.9–10.3)
Chloride: 102 mmol/L (ref 98–111)
Creatinine, Ser: 0.6 mg/dL — ABNORMAL LOW (ref 0.61–1.24)
GFR, Estimated: >60 mL/min (ref >60)
Glucose, Bld: 95 mg/dL (ref 70–99)
Potassium: 3.9 mmol/L (ref 3.5–5.1)
Sodium: 134 mmol/L — ABNORMAL LOW (ref 135–145)

## 2021-11-13 LAB — CBC
HCT: 29 % — ABNORMAL LOW (ref 39.0–52.0)
Hemoglobin: 9.7 g/dL — ABNORMAL LOW (ref 13.0–17.0)
MCH: 28.6 pg (ref 26.0–34.0)
MCHC: 33.4 g/dL (ref 30.0–36.0)
MCV: 85.5 fL (ref 80.0–100.0)
Platelets: 131 10*3/uL — ABNORMAL LOW (ref 150–400)
RBC: 3.39 MIL/uL — ABNORMAL LOW (ref 4.22–5.81)
RDW: 15.5 % (ref 11.5–15.5)
WBC: 4.6 10*3/uL (ref 4.0–10.5)
nRBC: 0 % (ref 0.0–0.2)

## 2021-11-13 MED ORDER — ALBUMIN HUMAN 5 % IV SOLN
25.0000 g | Freq: Once | INTRAVENOUS | Status: DC
  Filled 2021-11-13: qty 500

## 2021-11-13 MED ORDER — BOOST / RESOURCE BREEZE PO LIQD CUSTOM
1.0000 | Freq: Three times a day (TID) | ORAL | Status: DC
  Administered 2021-11-14 – 2021-11-16 (×5): 1 via ORAL

## 2021-11-13 NOTE — TOC Initial Note (Signed)
Transition of Care [facility]) - Initial/Assessment Note    Patient Details  Name: Roberto Collins MRN: [id number] Date of Birth: 20-May-1951  Transition of Care [facility]) CM/SW Contact:    Eileen Stanford, LCSW Phone Number: 11/13/2021, 1:52 PM  Clinical Narrative:    Pt is in a contact room for bed bugs. Pt's sister at bedside. Pt ask that CSW speak to his sister in regards to him going to SNF.  CSW spoke with pt's sister and read the reviews for the SNF off the Medicare list.   Pt's sister prefers [facility]. CSW has sent referral.          Expected Discharge Plan: Skilled Nursing Facility Barriers to Discharge: Continued Medical Work up   Patient Goals and CMS Choice Patient states their goals for this hospitalization and ongoing recovery are:: to go to rehab CMS Medicare.gov Compare Post Acute Care list provided to:: Patient Represenative (must comment) (sister) Choice offered to / list presented to : Sibling  Expected Discharge Plan and Services Expected Discharge Plan: [place] In-house Referral: Clinical Social Work   Post Acute Care Choice: [place] Living arrangements for the past 2 months: [place]                                      Prior Living Arrangements/Services Living arrangements for the past 2 months: Single Family Home Lives with:: Self Patient language and need for interpreter reviewed:: Yes Do you feel safe going back to the place where you live?: Yes      Need for Family Participation in Patient Care: Yes (Comment) Care giver support system in place?: Yes (comment)   Criminal Activity/Legal Involvement Pertinent to Current Situation/Hospitalization: No - Comment as needed  Activities of Daily Living Home Assistive Devices/Equipment: None ADL Screening (condition at time of admission) Patient's cognitive ability adequate to safely complete daily activities?: Yes Is the patient deaf or have difficulty hearing?: No Does  the patient have difficulty seeing, even when wearing glasses/contacts?: No Does the patient have difficulty concentrating, remembering, or making decisions?: No Patient able to express need for assistance with ADLs?: Yes Does the patient have difficulty dressing or bathing?: Yes Independently performs ADLs?: No Communication: Independent Dressing (OT): Needs assistance Is this a change from baseline?: Change from baseline, expected to last >3 days Grooming: Needs assistance Is this a change from baseline?: Change from baseline, expected to last >3 days Feeding: Independent Bathing: Needs assistance Is this a change from baseline?: Change from baseline, expected to last >3 days Toileting: Independent In/Out Bed: Needs assistance Is this a change from baseline?: Change from baseline, expected to last >3 days Walks in Home: Needs assistance Is this a change from baseline?: Change from baseline, expected to last >3 days Does the patient have difficulty walking or climbing stairs?: Yes Weakness of Legs: Both Weakness of Arms/Hands: Both  Permission Sought/Granted Permission sought to share information with : Family Supports Permission granted to share information with : Yes, Verbal Permission Granted  Share Information with NAME: Anette  Permission granted to share info w AGENCY: [place] granted to share info w Relationship: sister     Emotional Assessment Appearance:: Appears stated age Attitude/Demeanor/Rapport: Engaged Affect (typically observed): Accepting Orientation: : Oriented to Self, Oriented to Place, Oriented to  Time, Oriented to Situation Alcohol / Substance Use: Not Applicable Psych Involvement: No (comment)  Admission diagnosis:  Weakness [R53.1] Pain [R52] Nausea & vomiting [R11.2] Aspiration pneumonia of right lower lobe, unspecified aspiration pneumonia type ([place]) [J69.0] Pneumonia [J18.9] Patient Active Problem List   Diagnosis Date  Noted   Weakness 11/10/2021   Pneumonia 11/10/2021   Peritoneal carcinomatosis ([place]) 10/03/2021   Candidate for statin therapy due to risk of future cardiovascular event 05/11/2021   Coronary artery disease involving native heart without angina pectoris 05/11/2021   Atherosclerosis 05/11/2021   Pulmonary emphysema ([place]) 05/11/2021   Current every day smoker 05/11/2021   Smoking greater than 30 pack years 05/11/2021   History of colonic polyps    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 [place] [address] Phone: [phone] Fax: [phone]     Social Determinants of Health (SDOH) Interventions    Readmission Risk Interventions No flowsheet data found.

## 2021-11-13 NOTE — Consult Note (Signed)
[place] NOTE  Patient Care Team: [name], PA-C as PCP - General (Family Medicine) Kate Sable, MD as PCP - Cardiology (Cardiology) Virgel Manifold, MD (Inactive) as Consulting Physician (Gastroenterology) Luetta Nutting Sharyn Dross, MD as Referring Physician (Pain Medicine) Cammie Sickle, MD as Consulting Physician (Medical Oncology) Kate Sable, MD as Consulting Physician (Cardiology) [name], MD as Consulting Physician (Colon and Rectal Surgery)  CHIEF COMPLAINTS/PURPOSE OF CONSULTATION: Peritoneal carcinomatosis  HISTORY OF PRESENTING ILLNESS:  Roberto Collins 71 y.o.  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  Patient received chemotherapy cycle 1 approximately 10 days ago.  Postchemotherapy noted to have worsening nausea with vomiting.  Patient underwent ultrasound-which did not show any significant ascites.  However over the last few days patient's weakness progressively got worse; and subsequently patient was evaluated in the emergency room.  Chest x-ray suggestive of right lower lobe pneumonia.  CT abdomen pelvis-increasing omental carcinomatosis; increasing ascites; also showed right-sided pleural effusion/right-sided infiltrate concerning for pneumonia.    Patient is currently on antibiotics noted to have improvement of his breathing.  His appetite is improving.  No diarrhea.  Continues to have abdominal distention.  Complains of leg swelling.  He feels weak all over; using a walker to get around.  Review of Systems  Constitutional:  Positive for malaise/fatigue and weight loss. Negative for chills, diaphoresis and fever.  HENT:  Negative for nosebleeds and sore throat.   Eyes:  Negative for double vision.  Respiratory:  Positive for cough and shortness of breath. Negative for hemoptysis, sputum production and wheezing.    Cardiovascular:  Positive for leg swelling. Negative for chest pain, palpitations and orthopnea.  Gastrointestinal:  Positive for abdominal pain. Negative for blood in stool, constipation, diarrhea, heartburn, melena, nausea and vomiting.  Genitourinary:  Negative for dysuria, frequency and urgency.  Musculoskeletal:  Positive for back pain and joint pain.  Skin: Negative.  Negative for itching and rash.  Neurological:  Negative for dizziness, tingling, focal weakness, weakness and headaches.  Endo/Heme/Allergies:  Does not bruise/bleed easily.  Psychiatric/Behavioral:  Negative for depression. The patient is not nervous/anxious and does not have insomnia.     MEDICAL HISTORY:  Past Medical History:  Diagnosis Date   Anemia    Asthma    Back pain    Blood transfusion without reported diagnosis     SURGICAL HISTORY: Past Surgical History:  Procedure Laterality Date   BACK SURGERY  2000   COLONOSCOPY WITH PROPOFOL N/A 04/02/2019   Procedure: COLONOSCOPY WITH PROPOFOL;  Surgeon: Virgel Manifold, MD;  Location: [facility] ENDOSCOPY;  Service: Endoscopy;  Laterality: N/A;   COLONOSCOPY WITH PROPOFOL N/A 04/20/2020   Procedure: COLONOSCOPY WITH PROPOFOL;  Surgeon: Virgel Manifold, MD;  Location: [facility] ENDOSCOPY;  Service: Endoscopy;  Laterality: N/A;   COLONOSCOPY WITH PROPOFOL N/A 07/28/2021   Procedure: COLONOSCOPY WITH PROPOFOL;  Surgeon: Virgel Manifold, MD;  Location: [facility] ENDOSCOPY;  Service: Endoscopy;  Laterality: N/A;   ESOPHAGOGASTRODUODENOSCOPY Left 03/31/2019   Procedure: ESOPHAGOGASTRODUODENOSCOPY (EGD);  Surgeon: Virgel Manifold, MD;  Location: [facility] ENDOSCOPY;  Service: Endoscopy;  Laterality: Left;   GIVENS CAPSULE STUDY N/A 04/02/2019   Procedure: GIVENS CAPSULE STUDY;  Surgeon: Virgel Manifold, MD;  Location: [facility] ENDOSCOPY;  Service: Endoscopy;  Laterality: N/A;   IR IMAGING GUIDED PORT INSERTION  10/27/2021   XI ROBOT ASSISTED TRANSANAL RESECTION N/A  06/25/2019   Procedure: XI ROBOT ASSISTED PARTIAL  PROCTECTOMY OF RECTAL MASS USING TAMIS;  Surgeon: [name], MD;  Location: [facility];  Service: General;  Laterality: N/A;    SOCIAL HISTORY: Social History   Socioeconomic History   Marital status: Single    Spouse name: Not on file   Number of children: 0   Years of education: Not on file   Highest education level: Not on file  Occupational History   Occupation: disabled  Tobacco Use   Smoking status: Every Day    Packs/day: 0.50    Years: 40.00    Pack years: 20.00    Types: Cigarettes    Start date: 08/02/2009    Passive exposure: Past   Smokeless tobacco: Never   Tobacco comments:    Agenda smoking cessation program info provided  Vaping Use   Vaping Use: Never used  Substance and Sexual Activity   Alcohol use: Not Currently   Drug use: Never   Sexual activity: Not Currently  Other Topics Concern   Not on file  Social History Narrative   Lives alone; used to roofing/ disabled; no children   Has limited support, mostly from sister   Received disability, has medicare but does not qualify for Medicaid due to income   Social Determinants of Health   Financial Resource Strain: Low Risk    Difficulty of Paying Living Expenses: Not hard at all  Food Insecurity: No Food Insecurity   Worried About Charity fundraiser in the Last Year: Never true   [place] in the Last Year: Never true  Transportation Needs: No Transportation Needs   Lack of Transportation (Medical): No   Lack of Transportation (Non-Medical): No  Physical Activity: Insufficiently Active   Days of Exercise per Week: 3 days   Minutes of Exercise per Session: 30 min  Stress: No Stress Concern Present   Feeling of Stress : Not at all  Social Connections: Socially Isolated   Frequency of Communication with Friends and Family: More than three times a week   Frequency of Social Gatherings with Friends and Family: Once a week   Attends Religious  Services: Never   Marine scientist or Organizations: No   Attends Music therapist: Never   Marital Status: Never married  Human resources officer Violence: Not At Risk   Fear of Current or Ex-Partner: No   Emotionally Abused: No   Physically Abused: No   Sexually Abused: No    FAMILY HISTORY: Family History  Problem Relation Age of Onset   Dementia Mother    Liver cancer Father     ALLERGIES:  is allergic to bee venom and penicillins.  MEDICATIONS:  Current Facility-Administered Medications  Medication Dose Route Frequency Provider Last Rate Last Admin   acetaminophen (TYLENOL) tablet 650 mg  650 mg Oral Q6H PRN Rise Patience, MD   650 mg at 11/13/21 1754   Or   acetaminophen (TYLENOL) suppository 650 mg  650 mg Rectal Q6H PRN Rise Patience, MD       [START ON 11/14/2021] albumin human 5 % solution 25 g  25 g Intravenous Once Charlaine Dalton R, MD       albuterol (PROVENTIL) (2.5 MG/3ML) 0.083% nebulizer solution 2.5 mg  2.5 mg Nebulization Q4H PRN Rise Patience, MD       Chlorhexidine Gluconate Cloth 2 % PADS 6 each  6 each Topical Daily Rise Patience, MD   6 each at 11/13/21 0928   cyclobenzaprine (FLEXERIL)  tablet 10 mg  10 mg Oral TID PRN Wyvonnia Dusky, MD   10 mg at 11/13/21 1754   cyclobenzaprine (FLEXERIL) tablet 10 mg  10 mg Oral QHS Wyvonnia Dusky, MD   10 mg at 11/12/21 2152   doxepin (SINEQUAN) capsule 25 mg  25 mg Oral QHS Rise Patience, MD   25 mg at 11/13/21 2101   enoxaparin (LOVENOX) injection 40 mg  40 mg Subcutaneous Q24H Rise Patience, MD   40 mg at 11/13/21 2100   feeding supplement (BOOST / RESOURCE BREEZE) liquid 1 Container  1 Container Oral TID BM Wyvonnia Dusky, MD       gabapentin (NEURONTIN) tablet 1,800 mg  1,800 mg Oral QHS Rise Patience, MD   1,800 mg at 11/13/21 2059   guaiFENesin (MUCINEX) 12 hr tablet 1,200 mg  1,200 mg Oral BID Wyvonnia Dusky, MD   1,200 mg at  11/13/21 2059   ipratropium-albuterol (DUONEB) 0.5-2.5 (3) MG/3ML nebulizer solution 3 mL  3 mL Nebulization Q6H PRN Wyvonnia Dusky, MD       levofloxacin (LEVAQUIN) IVPB 750 mg  750 mg Intravenous Q24H Rise Patience, MD 100 mL/hr at 11/13/21 1302 750 mg at 11/13/21 1302   loratadine (CLARITIN) tablet 10 mg  10 mg Oral Daily Wyvonnia Dusky, MD   10 mg at 11/13/21 0923   morphine (MS CONTIN) 12 hr tablet 60 mg  60 mg Oral Q12H Rise Patience, MD   60 mg at 11/13/21 2059   multivitamin with minerals tablet 1 tablet  1 tablet Oral Daily Wyvonnia Dusky, MD   1 tablet at 11/13/21 0923   pantoprazole (PROTONIX) EC tablet 40 mg  40 mg Oral Daily Wyvonnia Dusky, MD   40 mg at 11/13/21 0923      PHYSICAL EXAMINATION:  Vitals:   11/13/21 1601 11/13/21 2033  BP: 119/81 124/84  Pulse: 92 93  Resp: (!) 21   Temp: 98.3 F (36.8 C) 98.1 F (36.7 C)  SpO2: 96% 98%   Filed Weights   11/10/21 1118  Weight: 122 lb (55.3 kg)   Thin cachectic appearing Caucasian male patient.  Accompanied by family.  Physical Exam Vitals and nursing note reviewed.  HENT:     Head: Normocephalic and atraumatic.     Mouth/Throat:     Pharynx: Oropharynx is clear.  Eyes:     Extraocular Movements: Extraocular movements intact.     Pupils: Pupils are equal, round, and reactive to light.  Cardiovascular:     Rate and Rhythm: Normal rate and regular rhythm.  Pulmonary:     Comments: Decreased breath sounds bilaterally.  Abdominal:     General: There is distension.     Palpations: Abdomen is soft.  Musculoskeletal:        General: Normal range of motion.     Cervical back: Normal range of motion.     Right lower leg: Edema present.     Left lower leg: Edema present.  Skin:    General: Skin is warm.  Neurological:     General: No focal deficit present.     Mental Status: He is alert and oriented to person, place, and time.  Psychiatric:        Behavior: Behavior normal.         Judgment: Judgment normal.     LABORATORY DATA:  I have reviewed the data as listed Lab Results  Component Value Date  WBC 4.6 11/13/2021   HGB 9.7 (L) 11/13/2021   HCT 29.0 (L) 11/13/2021   MCV 85.5 11/13/2021   PLT 131 (L) 11/13/2021   Recent Labs    02/08/21 1628 05/11/21 1615 11/08/21 0818 11/10/21 1244 11/10/21 1528 11/11/21 0559 11/13/21 0340  NA 136   < > 135 137  --  136 134*  K 4.6   < > 3.5 3.4*  --  3.6 3.9  CL 99   < > 101 102  --  102 102  CO2 28   < > 26 22  --  27 27  GLUCOSE 85   < > 109* 96  --  98 95  BUN 15   < > 25* 20  --  18 19  CREATININE 1.03   < > 0.64 0.32* 0.39* 0.58* 0.60*  CALCIUM 9.4   < > 8.2* 7.9*  --  7.9* 7.9*  GFRNONAA 74   < > >60 >60 >60 >60 >60  GFRAA 86  --   --   --   --   --   --   PROT 7.2   < > 6.1* 5.7*  --  5.4*  --   ALBUMIN  --    < > 2.8* 2.6*  --  2.5*  --   AST 18   < > 24 19  --  18  --   ALT 17   < > 18 16  --  15  --   ALKPHOS  --    < > 64 58  --  54  --   BILITOT 0.4   < > 0.3 0.4  --  0.4  --    < > = values in this interval not displayed.    RADIOGRAPHIC STUDIES: I have personally reviewed the radiological images as listed and agreed with the findings in the report. DG Abd 1 View  Result Date: 11/10/2021 CLINICAL DATA:  Generalized weakness. History of stage IV gastric cancer. EXAM: ABDOMEN - 1 VIEW COMPARISON:  None. FINDINGS: The bowel gas pattern is normal. No radio-opaque calculi or other significant radiographic abnormality are seen. IMPRESSION: No abnormal bowel dilatation is noted. Electronically Signed   By: Marijo Conception M.D.   On: 11/10/2021 14:56   CT ABDOMEN PELVIS W CONTRAST  Result Date: 11/10/2021 CLINICAL DATA:  Nausea/vomiting weakness EXAM: CT ABDOMEN AND PELVIS WITH CONTRAST TECHNIQUE: Multidetector CT imaging of the abdomen and pelvis was performed using the standard protocol following bolus administration of intravenous contrast. RADIATION DOSE REDUCTION: This exam was performed  according to the departmental dose-optimization program which includes automated exposure control, adjustment of the mA and/or kV according to patient size and/or use of iterative reconstruction technique. CONTRAST:  65mL OMNIPAQUE IOHEXOL 300 MG/ML  SOLN COMPARISON:  CT 09/19/2021 FINDINGS: Lower chest: There is a new moderate size right pleural effusion with adjacent basilar atelectasis. Hepatobiliary: Scalloping of the liver parenchymal contours related to malignant ascites. The gallbladder is unremarkable. Pancreas: Unremarkable. No pancreatic ductal dilatation or surrounding inflammatory changes. Spleen: Normal in size without focal abnormality. Adrenals/Urinary Tract: Adrenal glands are unremarkable. Kidneys are normal, without renal calculi, focal lesion, or hydronephrosis. The bladder is mildly distended but unremarkable. Stomach/Bowel: No evidence of bowel obstruction. Displacement of the viscera due to viscous/malignant ascites. The appendix appears decompressed. Known colon cancer primary is not well visualized by CT but possibly representative on coronal image 53 where there is focal wall thickening near the rectosigmoid junction. Vascular/Lymphatic: Aortoiliac atherosclerotic  calcifications. No AAA. Reproductive: Unremarkable. Other: Cachectic appearance. Large volume abdominopelvic exudate of ascites, increased from prior exam, with diffuse peritoneal carcinomatosis and omental caking. Increased thickening of peritoneal metastases along the right upper quadrant, measuring 1.5 cm in thickness, previously 0.9 cm (series 2, image 22). Increased peritoneal soft tissue density in the pelvis. Musculoskeletal: Prior L4-S1 lumbar fusion. Moderate severe degenerative disc disease at L2-L3. Unchanged chronic right pelvic defect. IMPRESSION: New moderate size right pleural effusion with adjacent basilar atelectasis. Worsening diffuse peritoneal carcinomatosis. Increased, large volume malignant ascites. No  evidence of bowel obstruction. Electronically Signed   By: Maurine Simmering M.D.   On: 11/10/2021 16:56   US Venous Img Lower Bilateral (DVT)  Result Date: 11/10/2021 CLINICAL DATA:  Pain EXAM: Bilateral LOWER EXTREMITY VENOUS DOPPLER ULTRASOUND TECHNIQUE: Gray-scale sonography with compression, as well as color and duplex ultrasound, were performed to evaluate the deep venous system(s) from the level of the common femoral vein through the popliteal and proximal calf veins. COMPARISON:  None. FINDINGS: VENOUS Normal compressibility of the common femoral, superficial femoral, and popliteal veins, as well as the visualized calf veins on both sides. Visualized portions of profunda femoral vein and great saphenous vein unremarkable. No filling defects to suggest DVT on grayscale or color Doppler imaging. Doppler waveforms show normal direction of venous flow, normal respiratory plasticity and response to augmentation. . OTHER None. Limitations: none IMPRESSION: There is no evidence of deep venous thrombosis in both lower extremities. Electronically Signed   By: Elmer Picker M.D.   On: 11/10/2021 17:41   CT BIOPSY  Result Date: 10/27/2021 CLINICAL DATA:  Peritoneal carcinomatosis and ascites of unknown primary with prior paracentesis demonstrating malignant cells in ascites. Material was not sufficient for additional molecular analysis and core biopsy has been requested for further tissue analysis. EXAM: CT GUIDED CORE BIOPSY OF PERITONEAL/OMENTAL TUMOR ANESTHESIA/SEDATION: Moderate (conscious) sedation was employed during this procedure. A total of Versed 1.0 mg and Fentanyl 50 mcg was administered intravenously by radiology nursing. Moderate Sedation Time: 12 minutes. The patient's level of consciousness and vital signs were monitored continuously by radiology nursing throughout the procedure under my direct supervision. PROCEDURE: The procedure risks, benefits, and alternatives were explained to the patient.  Questions regarding the procedure were encouraged and answered. The patient understands and consents to the procedure. A time-out was performed prior to initiating the procedure. CT was performed through the abdomen and pelvis. The left lower abdominal wall was prepped with chlorhexidine in a sterile fashion, and a sterile drape was applied covering the operative field. A sterile gown and sterile gloves were used for the procedure. Local anesthesia was provided with 1% Lidocaine. Under CT guidance, a 17 gauge trocar needle was advanced from a left anterior approach into the left peritoneal cavity. After confirming needle tip position, coaxial 18 gauge core biopsy samples were obtained. Four separate core biopsy samples were obtained and submitted in formalin. COMPLICATIONS: None FINDINGS: A region of peritoneal tumor demonstrating increased metabolic activity by PET scan in the left lateral lower peritoneal cavity was targeted. Solid, mucinous appearing tumor was obtained. IMPRESSION: CT-guided core biopsy performed peritoneal/omental tumor in the left lateral lower peritoneal cavity. Solid tissue was obtained. RADIATION DOSE REDUCTION: This exam was performed according to the departmental dose-optimization program which includes automated exposure control, adjustment of the mA and/or kV according to patient size and/or use of iterative reconstruction technique. Electronically Signed   By: Aletta Edouard M.D.   On: 10/27/2021 13:34   DG Chest  Port 1 View  Result Date: 11/10/2021 CLINICAL DATA:  Weakness EXAM: PORTABLE CHEST 1 VIEW COMPARISON:  Chest x-ray dated August 03, 2019 FINDINGS: Cardiac and mediastinal contours are within normal limits. Right chest wall port with tip positioned near the superior cavoatrial junction. New right basilar opacity. Small right pleural effusion. IMPRESSION: 1. New right basilar opacity, concerning for infection or aspiration. 2. Small right pleural effusion. Electronically  Signed   By: Yetta Glassman M.D.   On: 11/10/2021 12:39   [place] ASCITES (ABDOMEN LIMITED)  Result Date: 11/03/2021 CLINICAL DATA:  Ascites EXAM: LIMITED ABDOMEN ULTRASOUND FOR ASCITES TECHNIQUE: Limited ultrasound survey for ascites was performed in all four abdominal quadrants. COMPARISON:  None. FINDINGS: Limited sonographic exam of the abdomen was performed. Images demonstrate small volume ascites, primarily in the right upper quadrant, with very little ascites in the lower quadrants for safe image guided paracentesis. No paracentesis performed. IMPRESSION: Small volume ascites.  No paracentesis performed. Electronically Signed   By: Albin Felling M.D.   On: 11/03/2021 15:16   IR IMAGING GUIDED PORT INSERTION  Result Date: 10/27/2021 CLINICAL DATA:  Peritoneal carcinomatosis and need for porta cath for chemotherapy. EXAM: IMPLANTED PORT A CATH PLACEMENT WITH ULTRASOUND AND FLUOROSCOPIC GUIDANCE ANESTHESIA/SEDATION: Moderate (conscious) sedation was employed during this procedure. A total of Versed 1.0 mg and Fentanyl 50 mcg was administered intravenously by radiology nursing. Moderate Sedation Time: 23 minutes. The patient's level of consciousness and vital signs were monitored continuously by radiology nursing throughout the procedure under my direct supervision. FLUOROSCOPY TIME:  42 seconds.  3.3 mGy. PROCEDURE: The procedure, risks, benefits, and alternatives were explained to the patient. Questions regarding the procedure were encouraged and answered. The patient understands and consents to the procedure. A time-out was performed prior to initiating the procedure. Ultrasound was utilized to confirm patency of the right internal jugular vein. A permanent ultrasound image was recorded. The right neck and chest were prepped with chlorhexidine in a sterile fashion, and a sterile drape was applied covering the operative field. Maximum barrier sterile technique with sterile gowns and gloves were used for  the procedure. Local anesthesia was provided with 1% lidocaine. After creating a small venotomy incision, a 21 gauge needle was advanced into the right internal jugular vein under direct, real-time ultrasound guidance. Ultrasound image documentation was performed. After securing guidewire access, an 8 Fr dilator was placed. A J-wire was kinked to measure appropriate catheter length. A subcutaneous port pocket was then created along the upper chest wall utilizing sharp and blunt dissection. Portable cautery was utilized. The pocket was irrigated with sterile saline. A single lumen power injectable port was chosen for placement. The 8 Fr catheter was tunneled from the port pocket site to the venotomy incision. The port was placed in the pocket. External catheter was trimmed to appropriate length based on guidewire measurement. At the venotomy, an 8 Fr peel-away sheath was placed over a guidewire. The catheter was then placed through the sheath and the sheath removed. Final catheter positioning was confirmed and documented with a fluoroscopic spot image. The port was accessed with a needle and aspirated and flushed with heparinized saline. The access needle was removed. The venotomy and port pocket incisions were closed with subcutaneous 3-0 Monocryl and subcuticular 4-0 Vicryl. Dermabond was applied to both incisions. COMPLICATIONS: COMPLICATIONS None FINDINGS: After catheter placement, the tip lies at the cavo-atrial junction. The catheter aspirates normally and is ready for immediate use. IMPRESSION: Placement of single lumen port a cath via  right internal jugular vein. The catheter tip lies at the cavo-atrial junction. A power injectable port a cath was placed and is ready for immediate use. Electronically Signed   By: Aletta Edouard M.D.   On: 10/27/2021 13:35    Peritoneal carcinomatosis [facility]) #71 year old male patient with new diagnosis of Omental caking/peritoneal thickening/ascites-s/p biopsy  adenocarcinoma of colorectal origin-currently s/p FOLFOX chemotherapy cycle #1 is admitted to hospital for generalized weakness/right-sided pneumonia.    #Peritoneal carcinomatosis-of colorectal origin-s/p FOLFOX approximately 10 days ago.  Patient tolerating chemotherapy with with moderate side effects-see below  #Abdominal ascites-noted on CT scan-symptomatic with abdominal distention/reflux.    #Right pleural effusion/pneumonia-on antibiotics.   Recommendations:  #Agree with antibiotics; and potential discharge to rehab.  # Recommend ultrasound-guided paracentesis for ascites.   #I had a long discussion with the patient and his sister-in-law by the bedside-regarding his overall poor prognosis; and the difficulties experienced by the patient while on chemotherapy.  Patient is in agreement that his chemotherapy has to be on hold while acute issues resolve.  Will reevaluate the patient approximately 2 weeks or so/on outpatient basis and consider possible restarting of chemotherapy at that time.  However it was also discussed that if patient's clinical condition/performance status deteriorates-further chemotherapy will be discontinued.  If patient is not eligible for any further chemotherapy-hospice would be recommended.  For now patient wants to continue current scope of care.  Discussed with Roberto Collins; agree with outpatient follow-up with palliative care at the rehab center.  Patient can potentially be discharged to rehab after paracentesis is done.  Agree with DNR/DNI.  Thank you Dr. Jimmye Norman for allowing me to participate in the care of your pleasant patient. Please do not hesitate to contact me with questions or concerns in the interim.  Discussed with patient and his sister-in-law by the bedside in detail.  They are in agreement.  All questions were answered. The patient knows to call the clinic with any problems, questions or concerns.     Cammie Sickle, MD 11/13/2021 9:43  PM

## 2021-11-13 NOTE — Progress Notes (Signed)
PROGRESS NOTE    Roberto Collins  WEX:[id number] DOB: Jul 23, 1951 DOA: 11/10/2021 PCP: [name], PA-C   Assessment & Plan:   Principal Problem:   Weakness Active Problems:   Pulmonary emphysema ([place])   Peritoneal carcinomatosis ([place])   Pneumonia   Possible aspiration pneumonia: improving. 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 Recent chemo. Onco consulted, Dr. Rogue Bussing   Bed bugs: s/p permethrin. Continue w/ contact precautions   HLD: not take statin currently   Severe protein calorie malnutrition: continue on nutritional supplement. Encourage po intake   Normocytic anemia: likely secondary to recent chemo. H&H are labile.   Thrombocytopenia: likely secondary to recent chemo. Will continue to monitor   COPD: w/o exacerbation. Continue on bronchodilators, encourage incentive spirometry & flutter valve   DVT prophylaxis: lovenox  Code Status: DNR Family Communication: discussed pt's care w/ pt's sister in law, Anne Ng, and answered her questions  Disposition Plan: likely d/c back home w/ HH   Level of care: Med-Surg  Status is: Inpatient Remains inpatient appropriate because: severity of illness    Consultants:  Onco   Procedures:   Antimicrobials:    Subjective: Pt c/o fatigue   Objective: Vitals:   11/12/21 1154 11/12/21 1550 11/12/21 2036 11/13/21 0609  BP: 119/83 126/83 121/76 119/79  Pulse: 97  92 83  Resp: 18 18 18 18   Temp: 98 F (36.7 C) 98 F (36.7 C) 97.7 F (36.5 C) 98.9 F (37.2 C)  TempSrc:      SpO2: 98%  100% 96%  Weight:      Height:        Intake/Output Summary (Last 24 hours) at 11/13/2021 0723 Last data filed at 11/13/2021 9381 Gross per 24 hour  Intake 180.19 ml  Output 1700 ml  Net -1519.81 ml   Filed Weights   11/10/21 1118  Weight: 55.3 kg    Examination:  General exam: Appears  cachetic & disheveled  Respiratory system: decreased breath sounds b/l  Cardiovascular system: S1 & S2+. No rubs or gallops  Gastrointestinal system: Abd is soft, NT, ND & hypoactive bowel sounds  Central nervous system: alert and oriented. Moves all extremities  Psychiatry: judgement and insight appears normal. Flat mood and affect    Data Reviewed: I have personally reviewed following labs and imaging studies  CBC: Recent Labs  Lab 11/08/21 0818 11/10/21 1244 11/10/21 1528 11/11/21 0559  WBC 7.2 5.9 4.7 4.2  NEUTROABS 6.2  --   --   --   HGB 10.8* 10.3* 9.5* 9.9*  HCT 32.7* 31.0* 28.1* 29.6*  MCV 86.7 87.8 87.0 87.8  PLT 166 170 149* 017*   Basic Metabolic Panel: Recent Labs  Lab 11/08/21 0818 11/10/21 1244 11/10/21 1528 11/11/21 0559  NA 135 137  --  136  K 3.5 3.4*  --  3.6  CL 101 102  --  102  CO2 26 22  --  27  GLUCOSE 109* 96  --  98  BUN 25* 20  --  18  CREATININE 0.64 0.32* 0.39* 0.58*  CALCIUM 8.2* 7.9*  --  7.9*   GFR: Estimated Creatinine Clearance: 68.2 mL/min (A) (by C-G formula based on SCr of 0.58 mg/dL (L)). Liver Function Tests: Recent Labs  Lab 11/08/21 0818 11/10/21 1244 11/11/21 0559  AST 24 19 18   ALT 18 16 15   ALKPHOS 64 58 54  BILITOT 0.3 0.4 0.4  PROT 6.1* 5.7* 5.4*  ALBUMIN 2.8* 2.6* 2.5*   No results for input(s): LIPASE, AMYLASE in the last 168 hours. No results for input(s): AMMONIA in the last 168 hours. Coagulation Profile: No results for input(s): INR, PROTIME in the last 168 hours. Cardiac Enzymes: No results for input(s): CKTOTAL, CKMB, CKMBINDEX, TROPONINI in the last 168 hours. BNP (last 3 results) No results for input(s): PROBNP in the last 8760 hours. HbA1C: No results for input(s): HGBA1C in the last 72 hours. CBG: No results for input(s): GLUCAP in the last 168 hours. Lipid Profile: No results for input(s): CHOL, HDL, LDLCALC, TRIG, CHOLHDL, LDLDIRECT in the last 72 hours. Thyroid Function Tests: No  results for input(s): TSH, T4TOTAL, FREET4, T3FREE, THYROIDAB in the last 72 hours. Anemia Panel: No results for input(s): VITAMINB12, FOLATE, FERRITIN, TIBC, IRON, RETICCTPCT in the last 72 hours. Sepsis Labs: Recent Labs  Lab 11/10/21 1357  LATICACIDVEN 0.7    Recent Results (from the past 240 hour(s))  Resp Panel by RT-PCR (Flu A&B, Covid) Nasopharyngeal Swab     Status: None   Collection Time: 11/10/21 12:44 PM   Specimen: Nasopharyngeal Swab; Nasopharyngeal(NP) swabs in vial transport medium  Result Value Ref Range Status   SARS Coronavirus 2 by RT PCR NEGATIVE NEGATIVE Final    Comment: (NOTE) SARS-CoV-2 target nucleic acids are NOT DETECTED.  The SARS-CoV-2 RNA is generally detectable in upper respiratory specimens during the acute phase of infection. The lowest concentration of SARS-CoV-2 viral copies this assay can detect is 138 copies/mL. A negative result does not preclude SARS-Cov-2 infection and should not be used as the sole basis for treatment or other patient management decisions. A negative result may occur with  improper specimen collection/handling, submission of specimen other than nasopharyngeal swab, presence of viral mutation(s) within the areas targeted by this assay, and inadequate number of viral copies(<138 copies/mL). A negative result must be combined with clinical observations, patient history, and epidemiological information. The expected result is Negative.  Fact Sheet for Patients:  EntrepreneurPulse.com.au  Fact Sheet for Healthcare Providers:  IncredibleEmployment.be  This test is no t yet approved or cleared by the [place] FDA and  has been authorized for detection and/or diagnosis of SARS-CoV-2 by FDA under an Emergency Use Authorization (EUA). This EUA will remain  in effect (meaning this test can be used) for the duration of the COVID-19 declaration under Section 564(b)(1) of the Act,  21 U.S.C.section 360bbb-3(b)(1), unless the authorization is terminated  or revoked sooner.       Influenza A by PCR NEGATIVE NEGATIVE Final   Influenza B by PCR NEGATIVE NEGATIVE Final    Comment: (NOTE) The Xpert Xpress SARS-CoV-2/FLU/RSV plus assay is intended as an aid in the diagnosis of influenza from Nasopharyngeal swab specimens and should not be used as a sole basis for treatment. Nasal washings and aspirates are unacceptable for Xpert Xpress SARS-CoV-2/FLU/RSV testing.  Fact Sheet for Patients: EntrepreneurPulse.com.au  Fact Sheet for Healthcare Providers: IncredibleEmployment.be  This test is not yet approved or cleared by the [place] FDA and has been authorized for detection and/or diagnosis of SARS-CoV-2 by FDA under an Emergency Use Authorization (EUA). This EUA will remain in effect (meaning this test can be used) for the duration of the COVID-19 declaration under Section 564(b)(1) of the Act, 21 U.S.C. section 360bbb-3(b)(1), unless the authorization is terminated or revoked.  Performed at [facility], [address], [place], [place] [address]   Blood culture (routine x 2)  Status: None (Preliminary result)   Collection Time: 11/10/21  1:57 PM   Specimen: BLOOD  Result Value Ref Range Status   Specimen Description BLOOD CENTRAL LINE  Final   Special Requests   Final    BOTTLES DRAWN AEROBIC AND ANAEROBIC Blood Culture results may not be optimal due to an excessive volume of blood received in culture bottles   Culture   Final    NO GROWTH 3 DAYS Performed at [facility], [address] [place], [place] [address]    Report Status PENDING  Incomplete  Blood culture (routine x 2)     Status: None (Preliminary result)   Collection Time: 11/10/21  1:57 PM   Specimen: BLOOD  Result Value Ref Range Status   Specimen Description BLOOD CENTRAL LINE  Final   Special Requests   Final    BOTTLES  DRAWN AEROBIC AND ANAEROBIC Blood Culture results may not be optimal due to an excessive volume of blood received in culture bottles   Culture   Final    NO GROWTH 3 DAYS Performed at [facility], [address] [place], [place] [address]    Report Status PENDING  Incomplete         Radiology Studies: No results found.      Scheduled Meds:  (feeding supplement) PROSource Plus  30 mL Oral BID BM   Chlorhexidine Gluconate Cloth  6 each Topical Daily   cyclobenzaprine  10 mg Oral QHS   doxepin  25 mg Oral QHS   enoxaparin (LOVENOX) injection  40 mg Subcutaneous Q24H   feeding supplement  237 mL Oral TID BM   gabapentin  1,800 mg Oral QHS   guaiFENesin  1,200 mg Oral BID   loratadine  10 mg Oral Daily   morphine  60 mg Oral Q12H   multivitamin with minerals  1 tablet Oral Daily   pantoprazole  40 mg Oral Daily   Continuous Infusions:  levofloxacin (LEVAQUIN) IV Stopped (11/12/21 1323)     LOS: 2 days    Time spent: 20 mins    Wyvonnia Dusky, MD [facility] Pager [phone]  If 7PM-7AM, please contact night-coverage 11/13/2021, 7:23 AM

## 2021-11-13 NOTE — Progress Notes (Addendum)
Physical Therapy Treatment Patient Details Name: Roberto Collins MRN: [id number] DOB: 08-19-1951 Today's Date: 11/13/2021   History of Present Illness Pt is a 71 y/o M admitted on 11/10/21 after presenting with c/c of weakness; likely 2/2 deconditioning, poor appetitie & possible aspiration PNA. PMH: rectal CA stage IV with peritoneal carcinomatosis, anemia, asthma, back pain    PT Comments    Pt agrees on second attempt.  He stated he feels poorly today and does not feel like he can do anything.  He does agree with encouragement and is able to get to EOB with supervision.  Stood with min a x 1 and transfers to recliner at bedside.  He is encouraged to try to walk but does not feel like he is able to at this time but agrees to sitting in recliner. He does not use RW at baseline but feels he would be better off with one now.  Long discussion with pt.  On eval he was able to walk 100' with min a x 1.  He stated he has his good and bad days and some days he struggles to get dressed some days and struggles most day to make his meals.  Stated he has Boost by his chair for the days he cannot manage his food but that they given him bad heartburn and he stays up all night when he drinks them so he does not like to use them.   Pt stated the past few weeks have been progressively hard to manage at home.  Sister in law is able to help some but not enough or provide direct care.  She has requested SNF.  Given discussion with pt and pt's general decline over the past few weeks, it does seem reasonable for SNF transition to get pt back to baseline mobility and ability to care for himself.  He stated he is motivated to do so and does not want to "give up yet."  Will update discharge recommendations and discuss with team.  Pt is in agreement with plan.     Recommendations for follow up therapy are one component of a multi-disciplinary discharge planning process, led by the attending physician.  Recommendations may be  updated based on patient status, additional functional criteria and insurance authorization.  Follow Up Recommendations  Skilled nursing-short term rehab (<3 hours/day)     Assistance Recommended at Discharge Intermittent Supervision/Assistance  Patient can return home with the following Assist for transportation;Direct supervision/assist for financial management;Assistance with cooking/housework;Help with stairs or ramp for entrance;A little help with walking and/or transfers;A little help with bathing/dressing/bathroom   Equipment Recommendations  Rolling walker (2 wheels);BSC/3in1    Recommendations for Other Services       Precautions / Restrictions Precautions Precautions: Fall Restrictions Weight Bearing Restrictions: No     Mobility  Bed Mobility Overal bed mobility: Modified Independent                  Transfers Overall transfer level: Needs assistance Equipment used: Rolling walker (2 wheels) Transfers: Sit to/from Stand Sit to Stand: Min guard                Ambulation/Gait Ambulation/Gait assistance: Min guard Gait Distance (Feet): 3 Feet Assistive device: Rolling walker (2 wheels) Gait Pattern/deviations: Decreased step length - right, Decreased step length - left, Decreased stride length           Stairs             Wheelchair Mobility  Modified Rankin (Stroke Patients Only)       Balance Overall balance assessment: Needs assistance Sitting-balance support: Feet supported Sitting balance-Leahy Scale: Good     Standing balance support: Bilateral upper extremity supported Standing balance-Leahy Scale: Fair                              Cognition Arousal/Alertness: Awake/alert Behavior During Therapy: WFL for tasks assessed/performed Overall Cognitive Status: Within Functional Limits for tasks assessed                                          Exercises      General Comments         Pertinent Vitals/Pain Pain Assessment Pain Assessment: No/denies pain    Home Living                          Prior Function            PT Goals (current goals can now be found in the care plan section) Progress towards PT goals: Not progressing toward goals - comment    Frequency    Min 2X/week      PT Plan Discharge plan needs to be updated    Co-evaluation              AM-PAC PT "6 Clicks" Mobility   Outcome Measure  Help needed turning from your back to your side while in a flat bed without using bedrails?: None Help needed moving from lying on your back to sitting on the side of a flat bed without using bedrails?: None Help needed moving to and from a bed to a chair (including a wheelchair)?: A Little Help needed standing up from a chair using your arms (e.g., wheelchair or bedside chair)?: A Little Help needed to walk in hospital room?: A Little Help needed climbing 3-5 steps with a railing? : A Little 6 Click Score: 20    End of Session Equipment Utilized During Treatment: Gait belt Activity Tolerance: Patient tolerated treatment well Patient left: in chair;with chair alarm set;with call bell/phone within reach Nurse Communication: Mobility status PT Visit Diagnosis: Unsteadiness on feet (R26.81);Muscle weakness (generalized) (M62.81)     Time: 0254-2706 PT Time Calculation (min) (ACUTE ONLY): 17 min  Charges:  $Therapeutic Activity: 8-22 mins                    Chesley Noon, PTA 11/13/21, 12:41 PM

## 2021-11-13 NOTE — Consult Note (Signed)
° °  °Palliative Medicine °[facility] °[phone] [phone] ° ° °Name: Roberto Collins °Date: 11/13/2021 °MRN: [id number]  °DOB: 05/21/1951 ° °Patient Care Team: °Tapia, Leisa, PA-C as PCP - General (Family Medicine) °Agbor-Etang, Brian, MD as PCP - Cardiology (Cardiology) °Tahiliani, Varnita B, MD (Inactive) as Consulting Physician (Gastroenterology) °Marks, David Morris, MD as Referring Physician (Pain Medicine) °Brahmanday, Govinda R, MD as Consulting Physician (Medical Oncology) °Agbor-Etang, Brian, MD as Consulting Physician (Cardiology) °Gross, Steven, MD as Consulting Physician (Colon and Rectal Surgery)  ° ° °REASON FOR CONSULTATION: °Roberto Collins is a 71 y.o. 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  Patient is on systemic chemotherapy with FOLFOX.  He was admitted to the hospital on 11/10/2021 with possible aspiration pneumonia.  He is referred to palliative care to help address goals and manage ongoing symptoms.  ° °SOCIAL HISTORY:    ° reports that he has been smoking cigarettes. He started smoking about 12 years ago. He has a 20.00 pack-year smoking history. He has been exposed to tobacco smoke. He has never used smokeless tobacco. He reports that he does not currently use alcohol. He reports that he does not use drugs. ° °Patient was never married but did have a long-term girlfriend with whom he lived for a while.  He has no children.  He has a brother and an uncle who are involved.  However, patient's caregiver is his sister-in-law who lives about 30 minutes away.  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 ° °ADVANCE DIRECTIVES:  °On file ° °CODE STATUS: DNR ° °PAST MEDICAL HISTORY: °Past Medical History:  °Diagnosis Date  ° Anemia   ° Asthma   ° Back pain   ° Blood transfusion without reported diagnosis    ° ° °PAST SURGICAL HISTORY:  °Past Surgical History:  °Procedure Laterality Date  ° BACK SURGERY  2000  ° COLONOSCOPY WITH PROPOFOL N/A 04/02/2019  ° Procedure: COLONOSCOPY WITH PROPOFOL;  Surgeon: Tahiliani, Varnita B, MD;  Location: [facility] ENDOSCOPY;  Service: Endoscopy;  Laterality: N/A;  ° COLONOSCOPY WITH PROPOFOL N/A 04/20/2020  ° Procedure: COLONOSCOPY WITH PROPOFOL;  Surgeon: Tahiliani, Varnita B, MD;  Location: [facility] ENDOSCOPY;  Service: Endoscopy;  Laterality: N/A;  ° COLONOSCOPY WITH PROPOFOL N/A 07/28/2021  ° Procedure: COLONOSCOPY WITH PROPOFOL;  Surgeon: Tahiliani, Varnita B, MD;  Location: [facility] ENDOSCOPY;  Service: Endoscopy;  Laterality: N/A;  ° ESOPHAGOGASTRODUODENOSCOPY Left 03/31/2019  ° Procedure: ESOPHAGOGASTRODUODENOSCOPY (EGD);  Surgeon: Tahiliani, Varnita B, MD;  Location: [facility] ENDOSCOPY;  Service: Endoscopy;  Laterality: Left;  ° GIVENS CAPSULE STUDY N/A 04/02/2019  ° Procedure: GIVENS CAPSULE STUDY;  Surgeon: Tahiliani, Varnita B, MD;  Location: [facility] ENDOSCOPY;  Service: Endoscopy;  Laterality: N/A;  ° IR IMAGING GUIDED PORT INSERTION  10/27/2021  ° XI ROBOT ASSISTED TRANSANAL RESECTION N/A 06/25/2019  ° Procedure: XI ROBOT ASSISTED PARTIAL PROCTECTOMY OF RECTAL MASS USING TAMIS;  Surgeon: Gross, Steven, MD;  Location: [facility];  Service: General;  Laterality: N/A;  ° ° °HEMATOLOGY/ONCOLOGY HISTORY:  °Oncology History Overview Note  °# JULy 2020- RECTAL MASS-frond-like/villous non-obstructing large mass partially circumferential (involving one half of the lumen circumference; Dr.Tahiliani]; CT C/A/P- NEGATIVE METASTATIC DISEASE; Bx- FRAGMENTS OF TUBULOVILLOUS ADENOMA WITH FOCAL HIGH-GRADE DYSPLASIA; - INVASIVE CARCINOMA NOT IDENTIFIED.  ° °# July 2020- Severe IDA- Hb 3.5 [s/p PRBC] ° °SURGICAL PATHOLOGY  °CASE: [id number]  °PATIENT: Roberto Collins  °  Surgical Pathology Report  ° ° ° ° °Specimen Submitted:  °A. Colon polyp x8, transverse; cold snare  °B. Colon polyp x2, sigmoid; cold snare  °C. Rectum  polyp; cbx  ° °Clinical History: Colon cancer screening Z12.11.  History of colonic  °polyps Z86.010.  Colon polyps  ° ° ° ° ° °DIAGNOSIS:  °A. COLON POLYPS X8, TRANSVERSE; COLD SNARE:  °- MULTIPLE FRAGMENTS OF TUBULAR ADENOMAS.  °- NEGATIVE FOR HIGH-GRADE DYSPLASIA OR MALIGNANCY.  ° °B. COLON POLYPS X2, SIGMOID; COLD SNARE:  °- MULTIPLE FRAGMENTS OF TUBULAR ADENOMAS.  °- NEGATIVE FOR HIGH-GRADE DYSPLASIA AND MALIGNANCY.  ° °C. RECTAL POLYP; COLD BIOPSY:  °- SUPERFICIAL STRIPS OF TUBULOVILLOUS ADENOMA.  °- DEFINITE HIGH-GRADE DYSPLASIA IS NOT IDENTIFIED.  °- NEGATIVE FOR MALIGNANCY IN A LIMITED SAMPLING.  ° °Comment:  °The patient's prior history of villous adenoma with high-grade dysplasia  °involving the rectum is noted.  Biopsy sections from the rectal polyp  °display superficial strips of villous tissue with adenomatous changes.  °Definite high-grade dysplasia and malignancy are not identified in the  °current specimen.  However, evaluation is limited by the superficial  °nature of the sampling.  Clinical and endoscopic correlation is  °recommended.  ° °IMPRESSION: °1. Findings consistent with metastatic peritoneal carcinomatosis as °described including diffuse peritoneal thickening and nodularity, °omental caking, and large volume ascites with scalloping of visceral °surfaces. °2. Amorphous ill-defined hypodense masslike density at the lower °rectum which may represent the known rectal cancer, limited °visualization. °3. Other ancillary findings as described. °  °  °Electronically Signed °  By: Delaney  Williams M.D. °  On: 09/20/2021 10:41 ° °31st, JAN 2023-  °A. OMENTUM, LEFT LATERAL; CT-GUIDED CORE NEEDLE BIOPSY:  °- POSITIVE FOR MALIGNANCY.  °- METASTATIC ADENOCARCINOMA WITH MUCINOUS FEATURES, COMPATIBLE WITH  °COLORECTAL PRIMARY. ° °There is sufficient tissue present for ancillary molecular testing-  ° °# cycle #1 FOLFOX [11/01/2021] °  ° °# COPD ° °# active smoker °  °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HCC)   °04/10/2019 Initial Diagnosis  ° 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HCC) °  °11/01/2021 Cancer Staging  ° Staging form: Colon and Rectum, AJCC 8th Edition °- Clinical: Stage IVC (cTX, cNX, pM1c) - Signed by Brahmanday, Govinda R, MD on 11/01/2021 ° °  °Peritoneal carcinomatosis (HCC)  °10/03/2021 Initial Diagnosis  ° Peritoneal carcinomatosis (HCC) °  °11/01/2021 -  Chemotherapy  ° Patient is on Treatment Plan : COLORECTAL FOLFOX q14d  °   ° ° °ALLERGIES:  is allergic to bee venom and penicillins. ° °MEDICATIONS:  °Current Facility-Administered Medications  °Medication Dose Route Frequency Provider Last Rate Last Admin  ° (feeding supplement) PROSource Plus liquid 30 mL  30 mL Oral BID BM Williams, Jamiese M, MD   30 mL at 11/11/21 1701  ° acetaminophen (TYLENOL) tablet 650 mg  650 mg Oral Q6H PRN Kakrakandy, Arshad N, MD      ° Or  ° acetaminophen (TYLENOL) suppository 650 mg  650 mg Rectal Q6H PRN Kakrakandy, Arshad N, MD      ° albuterol (PROVENTIL) (2.5 MG/3ML) 0.083% nebulizer solution 2.5 mg  2.5 mg Nebulization Q4H PRN Kakrakandy, Arshad N, MD      ° Chlorhexidine Gluconate Cloth 2 % PADS 6 each  6 each Topical Daily Kakrakandy, Arshad N, MD   6 each at 11/13/21 0928  ° cyclobenzaprine (FLEXERIL) tablet 10 mg  10 mg Oral TID PRN Williams, Jamiese M, MD   10 mg at 11/11/21 1017  ° cyclobenzaprine (FLEXERIL) tablet 10 mg    10 mg Oral QHS Wyvonnia Dusky, MD   10 mg at 11/12/21 2152   doxepin (SINEQUAN) capsule 25 mg  25 mg Oral QHS Rise Patience, MD   25 mg at 11/12/21 2159   enoxaparin (LOVENOX) injection 40 mg  40 mg Subcutaneous Q24H Rise Patience, MD   40 mg at 11/10/21 2224   feeding supplement (ENSURE ENLIVE / ENSURE PLUS) liquid 237 mL  237 mL Oral TID BM Wyvonnia Dusky, MD   237 mL at 11/11/21 2000   gabapentin (NEURONTIN) tablet 1,800 mg  1,800 mg Oral QHS Rise Patience, MD   1,800 mg at 11/12/21 2152   guaiFENesin (MUCINEX) 12 hr tablet 1,200 mg  1,200 mg Oral BID Wyvonnia Dusky, MD   1,200 mg at 11/13/21 8841   ipratropium-albuterol (DUONEB) 0.5-2.5 (3) MG/3ML nebulizer solution 3 mL  3 mL Nebulization Q6H PRN Wyvonnia Dusky, MD       levofloxacin (LEVAQUIN) IVPB 750 mg  750 mg Intravenous Q24H Rise Patience, MD 100 mL/hr at 11/13/21 1302 750 mg at 11/13/21 1302   loratadine (CLARITIN) tablet 10 mg  10 mg Oral Daily Wyvonnia Dusky, MD   10 mg at 11/13/21 6606   morphine (MS CONTIN) 12 hr tablet 60 mg  60 mg Oral Q12H Rise Patience, MD   60 mg at 11/13/21 3016   multivitamin with minerals tablet 1 tablet  1 tablet Oral Daily Wyvonnia Dusky, MD   1 tablet at 11/13/21 0923   pantoprazole (PROTONIX) EC tablet 40 mg  40 mg Oral Daily Wyvonnia Dusky, MD   40 mg at 11/13/21 0109    VITAL SIGNS: BP 119/81 (BP Location: Right Arm)    Pulse 92    Temp 98.3 F (36.8 C) (Oral)    Resp (!) 21    Ht 5' 10" (1.778 m)    Wt 122 lb (55.3 kg)    SpO2 96%    BMI 17.51 kg/m  Filed Weights   11/10/21 1118  Weight: 122 lb (55.3 kg)    Estimated body mass index is 17.51 kg/m as calculated from the following:   Height as of this encounter: 5' 10" (1.778 m).   Weight as of this encounter: 122 lb (55.3 kg).  LABS: CBC:    Component Value Date/Time   WBC 4.6 11/13/2021 0340   HGB 9.7 (L) 11/13/2021 0340   HCT 29.0 (L) 11/13/2021 0340   PLT 131 (L) 11/13/2021 0340   MCV 85.5 11/13/2021 0340   NEUTROABS 6.2 11/08/2021 0818   LYMPHSABS 0.5 (L) 11/08/2021 0818   MONOABS 0.3 11/08/2021 0818   EOSABS 0.1 11/08/2021 0818   BASOSABS 0.0 11/08/2021 0818   Comprehensive Metabolic Panel:    Component Value Date/Time   NA 134 (L) 11/13/2021 0340   K 3.9 11/13/2021 0340   CL 102 11/13/2021 0340   CO2 27 11/13/2021 0340   BUN 19 11/13/2021 0340   CREATININE 0.60 (L) 11/13/2021 0340   CREATININE 0.78 10/24/2021 1427   GLUCOSE 95 11/13/2021 0340   CALCIUM 7.9 (L) 11/13/2021 0340   AST 18 11/11/2021 0559   ALT 15 11/11/2021 0559   ALKPHOS 54  11/11/2021 0559   BILITOT 0.4 11/11/2021 0559   PROT 5.4 (L) 11/11/2021 0559   ALBUMIN 2.5 (L) 11/11/2021 0559    RADIOGRAPHIC STUDIES: DG Abd 1 View  Result Date: 11/10/2021 CLINICAL DATA:  Generalized weakness. History of stage IV gastric cancer. EXAM:  ABDOMEN - 1 VIEW COMPARISON:  None. FINDINGS: The bowel gas pattern is normal. No radio-opaque calculi or other significant radiographic abnormality are seen. IMPRESSION: No abnormal bowel dilatation is noted. Electronically Signed   By: Marijo Conception M.D.   On: 11/10/2021 14:56   CT ABDOMEN PELVIS W CONTRAST  Result Date: 11/10/2021 CLINICAL DATA:  Nausea/vomiting weakness EXAM: CT ABDOMEN AND PELVIS WITH CONTRAST TECHNIQUE: Multidetector CT imaging of the abdomen and pelvis was performed using the standard protocol following bolus administration of intravenous contrast. RADIATION DOSE REDUCTION: This exam was performed according to the departmental dose-optimization program which includes automated exposure control, adjustment of the mA and/or kV according to patient size and/or use of iterative reconstruction technique. CONTRAST:  90m OMNIPAQUE IOHEXOL 300 MG/ML  SOLN COMPARISON:  CT 09/19/2021 FINDINGS: Lower chest: There is a new moderate size right pleural effusion with adjacent basilar atelectasis. Hepatobiliary: Scalloping of the liver parenchymal contours related to malignant ascites. The gallbladder is unremarkable. Pancreas: Unremarkable. No pancreatic ductal dilatation or surrounding inflammatory changes. Spleen: Normal in size without focal abnormality. Adrenals/Urinary Tract: Adrenal glands are unremarkable. Kidneys are normal, without renal calculi, focal lesion, or hydronephrosis. The bladder is mildly distended but unremarkable. Stomach/Bowel: No evidence of bowel obstruction. Displacement of the viscera due to viscous/malignant ascites. The appendix appears decompressed. Known colon cancer primary is not well visualized by CT but  possibly representative on coronal image 53 where there is focal wall thickening near the rectosigmoid junction. Vascular/Lymphatic: Aortoiliac atherosclerotic calcifications. No AAA. Reproductive: Unremarkable. Other: Cachectic appearance. Large volume abdominopelvic exudate of ascites, increased from prior exam, with diffuse peritoneal carcinomatosis and omental caking. Increased thickening of peritoneal metastases along the right upper quadrant, measuring 1.5 cm in thickness, previously 0.9 cm (series 2, image 22). Increased peritoneal soft tissue density in the pelvis. Musculoskeletal: Prior L4-S1 lumbar fusion. Moderate severe degenerative disc disease at L2-L3. Unchanged chronic right pelvic defect. IMPRESSION: New moderate size right pleural effusion with adjacent basilar atelectasis. Worsening diffuse peritoneal carcinomatosis. Increased, large volume malignant ascites. No evidence of bowel obstruction. Electronically Signed   By: JMaurine SimmeringM.D.   On: 11/10/2021 16:56   UKoreaVenous Img Lower Bilateral (DVT)  Result Date: 11/10/2021 CLINICAL DATA:  Pain EXAM: Bilateral LOWER EXTREMITY VENOUS DOPPLER ULTRASOUND TECHNIQUE: Gray-scale sonography with compression, as well as color and duplex ultrasound, were performed to evaluate the deep venous system(s) from the level of the common femoral vein through the popliteal and proximal calf veins. COMPARISON:  None. FINDINGS: VENOUS Normal compressibility of the common femoral, superficial femoral, and popliteal veins, as well as the visualized calf veins on both sides. Visualized portions of profunda femoral vein and great saphenous vein unremarkable. No filling defects to suggest DVT on grayscale or color Doppler imaging. Doppler waveforms show normal direction of venous flow, normal respiratory plasticity and response to augmentation. . OTHER None. Limitations: none IMPRESSION: There is no evidence of deep venous thrombosis in both lower extremities.  Electronically Signed   By: PElmer PickerM.D.   On: 11/10/2021 17:41   CT BIOPSY  Result Date: 10/27/2021 CLINICAL DATA:  Peritoneal carcinomatosis and ascites of unknown primary with prior paracentesis demonstrating malignant cells in ascites. Material was not sufficient for additional molecular analysis and core biopsy has been requested for further tissue analysis. EXAM: CT GUIDED CORE BIOPSY OF PERITONEAL/OMENTAL TUMOR ANESTHESIA/SEDATION: Moderate (conscious) sedation was employed during this procedure. A total of Versed 1.0 mg and Fentanyl 50 mcg was administered intravenously by radiology nursing.  Moderate Sedation Time: 12 minutes. The patient's level of consciousness and vital signs were monitored continuously by radiology nursing throughout the procedure under my direct supervision. PROCEDURE: The procedure risks, benefits, and alternatives were explained to the patient. Questions regarding the procedure were encouraged and answered. The patient understands and consents to the procedure. A time-out was performed prior to initiating the procedure. CT was performed through the abdomen and pelvis. The left lower abdominal wall was prepped with chlorhexidine in a sterile fashion, and a sterile drape was applied covering the operative field. A sterile gown and sterile gloves were used for the procedure. Local anesthesia was provided with 1% Lidocaine. Under CT guidance, a 17 gauge trocar needle was advanced from a left anterior approach into the left peritoneal cavity. After confirming needle tip position, coaxial 18 gauge core biopsy samples were obtained. Four separate core biopsy samples were obtained and submitted in formalin. COMPLICATIONS: None FINDINGS: A region of peritoneal tumor demonstrating increased metabolic activity by PET scan in the left lateral lower peritoneal cavity was targeted. Solid, mucinous appearing tumor was obtained. IMPRESSION: CT-guided core biopsy performed  peritoneal/omental tumor in the left lateral lower peritoneal cavity. Solid tissue was obtained. RADIATION DOSE REDUCTION: This exam was performed according to the departmental dose-optimization program which includes automated exposure control, adjustment of the mA and/or kV according to patient size and/or use of iterative reconstruction technique. Electronically Signed   By: Glenn  Yamagata M.D.   On: 10/27/2021 13:34  ° °DG Chest Port 1 View ° °Result Date: 11/10/2021 °CLINICAL DATA:  Weakness EXAM: PORTABLE CHEST 1 VIEW COMPARISON:  Chest x-ray dated August 03, 2019 FINDINGS: Cardiac and mediastinal contours are within normal limits. Right chest wall port with tip positioned near the superior cavoatrial junction. New right basilar opacity. Small right pleural effusion. IMPRESSION: 1. New right basilar opacity, concerning for infection or aspiration. 2. Small right pleural effusion. Electronically Signed   By: Leah  Strickland M.D.   On: 11/10/2021 12:39  ° °US ASCITES (ABDOMEN LIMITED) ° °Result Date: 11/03/2021 °CLINICAL DATA:  Ascites EXAM: LIMITED ABDOMEN ULTRASOUND FOR ASCITES TECHNIQUE: Limited ultrasound survey for ascites was performed in all four abdominal quadrants. COMPARISON:  None. FINDINGS: Limited sonographic exam of the abdomen was performed. Images demonstrate small volume ascites, primarily in the right upper quadrant, with very little ascites in the lower quadrants for safe image guided paracentesis. No paracentesis performed. IMPRESSION: Small volume ascites.  No paracentesis performed. Electronically Signed   By: Yasser  El-Abd M.D.   On: 11/03/2021 15:16  ° °IR IMAGING GUIDED PORT INSERTION ° °Result Date: 10/27/2021 °CLINICAL DATA:  Peritoneal carcinomatosis and need for porta cath for chemotherapy. EXAM: IMPLANTED PORT A CATH PLACEMENT WITH ULTRASOUND AND FLUOROSCOPIC GUIDANCE ANESTHESIA/SEDATION: Moderate (conscious) sedation was employed during this procedure. A total of Versed 1.0 mg and  Fentanyl 50 mcg was administered intravenously by radiology nursing. Moderate Sedation Time: 23 minutes. The patient's level of consciousness and vital signs were monitored continuously by radiology nursing throughout the procedure under my direct supervision. FLUOROSCOPY TIME:  42 seconds.  3.3 mGy. PROCEDURE: The procedure, risks, benefits, and alternatives were explained to the patient. Questions regarding the procedure were encouraged and answered. The patient understands and consents to the procedure. A time-out was performed prior to initiating the procedure. Ultrasound was utilized to confirm patency of the right internal jugular vein. A permanent ultrasound image was recorded. The right neck and chest were prepped with chlorhexidine in a sterile fashion, and a sterile drape   was applied covering the operative field. Maximum barrier sterile technique with sterile gowns and gloves were used for the procedure. Local anesthesia was provided with 1% lidocaine. After creating a small venotomy incision, a 21 gauge needle was advanced into the right internal jugular vein under direct, real-time ultrasound guidance. Ultrasound image documentation was performed. After securing guidewire access, an 8 Fr dilator was placed. A J-wire was kinked to measure appropriate catheter length. A subcutaneous port pocket was then created along the upper chest wall utilizing sharp and blunt dissection. Portable cautery was utilized. The pocket was irrigated with sterile saline. A single lumen power injectable port was chosen for placement. The 8 Fr catheter was tunneled from the port pocket site to the venotomy incision. The port was placed in the pocket. External catheter was trimmed to appropriate length based on guidewire measurement. At the venotomy, an 8 Fr peel-away sheath was placed over a guidewire. The catheter was then placed through the sheath and the sheath removed. Final catheter positioning was confirmed and documented  with a fluoroscopic spot image. The port was accessed with a needle and aspirated and flushed with heparinized saline. The access needle was removed. The venotomy and port pocket incisions were closed with subcutaneous 3-0 Monocryl and subcuticular 4-0 Vicryl. Dermabond was applied to both incisions. COMPLICATIONS: COMPLICATIONS None FINDINGS: After catheter placement, the tip lies at the cavo-atrial junction. The catheter aspirates normally and is ready for immediate use. IMPRESSION: Placement of single lumen port a cath via right internal jugular vein. The catheter tip lies at the cavo-atrial junction. A power injectable port a cath was placed and is ready for immediate use. Electronically Signed   By: Aletta Edouard M.D.   On: 10/27/2021 13:35    PERFORMANCE STATUS (ECOG) : 3 - Symptomatic, >50% confined to bed  Review of Systems Unless otherwise noted, a complete review of systems is negative.  Physical Exam General: NAD Pulmonary: Unlabored Extremities: no edema, no joint deformities Skin: no rashes Neurological: Weakness but otherwise nonfocal  IMPRESSION: I met with patient and sister-in-law.  Patient is deconditioned with progressively poor appetite and performance status.  Patient feels like he is too weak to undergo further chemotherapy.  We talked about option for hospice involvement.  However, patient would like to try rehab to see if he can improve his strength prior to returning home.  I will ask that he is followed by palliative care in the community.  We discussed follow-up in the [place] in at a later date to reassess and allow for further conversations regarding his goals with possible transition to hospice following rehab.  Patient denies significant symptomatic complaints at present.  He is appropriately DNR.  PLAN: -Best supportive care -Rehab with palliative care following -DNR  Case and plan discussed with Dr. Rogue Bussing  Time Total: 30 minutes  Visit  consisted of counseling and education dealing with the complex and emotionally intense issues of symptom management and palliative care in the setting of serious and potentially life-threatening illness.Greater than 50%  of this time was spent counseling and coordinating care related to the above assessment and plan.  Signed by: Altha Harm, PhD, NP-C

## 2021-11-13 NOTE — NC FL2 (Signed)
[place] LEVEL OF CARE SCREENING TOOL     IDENTIFICATION  Patient Name: Roberto Collins Birthdate: 19-Dec-1950 Sex: male Admission Date (Current Location): 11/10/2021  [facility] and [place] Number:  Engineering geologist and Address:  [facility], [address], [place], [place] [address]      Provider Number: [id number]  Attending Physician Name and Address:  Wyvonnia Dusky, MD  Relative Name and Phone Number:       Current Level of Care: Hospital Recommended Level of Care: [place] Prior Approval Number:    Date Approved/Denied:   PASRR Number: [id number]  Discharge Plan: SNF    Current Diagnoses: Patient Active Problem List   Diagnosis Date Noted   Weakness 11/10/2021   Pneumonia 11/10/2021   Peritoneal carcinomatosis ([place]) 10/03/2021   Candidate for statin therapy due to risk of future cardiovascular event 05/11/2021   Coronary artery disease involving native heart without angina pectoris 05/11/2021   Atherosclerosis 05/11/2021   Pulmonary emphysema ([place]) 05/11/2021   Current every day smoker 05/11/2021   Smoking greater than 30 pack years 05/11/2021   History of colonic polyps    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 06/25/2019 06/25/2019   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[place]) 04/10/2019   Iron deficiency anemia due to chronic blood loss 04/10/2019   Polyp of colon    Colon neoplasm    Diverticulosis of large intestine without diverticulitis    Chronic insomnia 04/04/2012   Chronic low back pain 04/04/2012   Depression 04/04/2012    Orientation RESPIRATION BLADDER Height & Weight     Self, Time, Situation, Place  Normal Incontinent Weight: 122 lb (55.3 kg) Height:  5\' 10"  (177.8 cm)  BEHAVIORAL SYMPTOMS/MOOD NEUROLOGICAL BOWEL NUTRITION STATUS      Incontinent Diet  AMBULATORY STATUS COMMUNICATION OF NEEDS Skin   Extensive Assist Verbally Normal                        Personal Care Assistance Level of Assistance  Dressing, Feeding, Bathing Bathing Assistance: Maximum assistance Feeding assistance: Independent Dressing Assistance: Maximum assistance     Functional Limitations Info  Sight, Hearing, Speech Sight Info: Adequate Hearing Info: Adequate Speech Info: Adequate    SPECIAL CARE FACTORS FREQUENCY  PT (By licensed PT), OT (By licensed OT)     PT Frequency: 5x OT Frequency: 5x            Contractures Contractures Info: Not present    Additional Factors Info  Code Status, Allergies Code Status Info: DNR Allergies Info: Bee Venom, Penicillins           Current Medications (11/13/2021):  This is the current hospital active medication list Current Facility-Administered Medications  Medication Dose Route Frequency Provider Last Rate Last Admin   (feeding supplement) PROSource Plus liquid 30 mL  30 mL Oral BID BM Wyvonnia Dusky, MD   30 mL at 11/11/21 1701   acetaminophen (TYLENOL) tablet 650 mg  650 mg Oral Q6H PRN Rise Patience, MD       Or   acetaminophen (TYLENOL) suppository 650 mg  650 mg Rectal Q6H PRN Rise Patience, MD       albuterol (PROVENTIL) (2.5 MG/3ML) 0.083% nebulizer solution 2.5 mg  2.5 mg Nebulization Q4H PRN Rise Patience, MD       Chlorhexidine Gluconate Cloth 2 % PADS 6 each  6 each Topical Daily Rise Patience, MD  6 each at 11/13/21 0928   cyclobenzaprine (FLEXERIL) tablet 10 mg  10 mg Oral TID PRN Wyvonnia Dusky, MD   10 mg at 11/11/21 1017   cyclobenzaprine (FLEXERIL) tablet 10 mg  10 mg Oral QHS Wyvonnia Dusky, MD   10 mg at 11/12/21 2152   doxepin (SINEQUAN) capsule 25 mg  25 mg Oral QHS Rise Patience, MD   25 mg at 11/12/21 2159   enoxaparin (LOVENOX) injection 40 mg  40 mg Subcutaneous Q24H Rise Patience, MD   40 mg at 11/10/21 2224   feeding supplement (ENSURE ENLIVE / ENSURE PLUS) liquid 237 mL  237 mL Oral TID BM Wyvonnia Dusky, MD   237  mL at 11/11/21 2000   gabapentin (NEURONTIN) tablet 1,800 mg  1,800 mg Oral QHS Rise Patience, MD   1,800 mg at 11/12/21 2152   guaiFENesin (MUCINEX) 12 hr tablet 1,200 mg  1,200 mg Oral BID Wyvonnia Dusky, MD   1,200 mg at 11/13/21 9179   ipratropium-albuterol (DUONEB) 0.5-2.5 (3) MG/3ML nebulizer solution 3 mL  3 mL Nebulization Q6H PRN Wyvonnia Dusky, MD       levofloxacin (LEVAQUIN) IVPB 750 mg  750 mg Intravenous Q24H Rise Patience, MD 100 mL/hr at 11/13/21 1302 750 mg at 11/13/21 1302   loratadine (CLARITIN) tablet 10 mg  10 mg Oral Daily Wyvonnia Dusky, MD   10 mg at 11/13/21 0923   morphine (MS CONTIN) 12 hr tablet 60 mg  60 mg Oral Q12H Rise Patience, MD   60 mg at 11/13/21 1505   multivitamin with minerals tablet 1 tablet  1 tablet Oral Daily Wyvonnia Dusky, MD   1 tablet at 11/13/21 0923   pantoprazole (PROTONIX) EC tablet 40 mg  40 mg Oral Daily Wyvonnia Dusky, MD   40 mg at 11/13/21 6979     Discharge Medications: Please see discharge summary for a list of discharge medications.  Relevant Imaging Results:  Relevant Lab Results:   Additional Information [phone]  Eileen Stanford, LCSW

## 2021-11-13 NOTE — Assessment & Plan Note (Addendum)
#  71 year old male patient with new diagnosis of Omental caking/peritoneal thickening/ascites-s/p biopsy adenocarcinoma of colorectal origin-currently s/p FOLFOX chemotherapy cycle #1 is admitted to hospital for generalized weakness/right-sided pneumonia.    #Peritoneal carcinomatosis-of colorectal origin-s/p FOLFOX approximately 10 days ago.  Patient tolerating chemotherapy with with moderate side effects-see below  #Abdominal ascites-noted on CT scan-symptomatic with abdominal distention/reflux.  #Right pleural effusion/pneumonia-on antibiotics.  Recommendations:  #Agree with antibiotics; and potential discharge to rehab.  # Recommend ultrasound-guided paracentesis for ascites.   #I had a long discussion with the patient and his sister-in-law by the bedside-regarding his overall poor prognosis; and the difficulties experienced by the patient while on chemotherapy.  Patient is in agreement that his chemotherapy has to be on hold while acute issues resolve.  Will reevaluate the patient approximately 2 weeks or so/on outpatient basis and consider possible restarting of chemotherapy at that time.  However it was also discussed that if patient's clinical condition/performance status deteriorates-further chemotherapy will be discontinued.  If patient is not eligible for any further chemotherapy-hospice would be recommended.  For now patient wants to continue current scope of care.  Discussed with Billey Chang; agree with outpatient follow-up with palliative care at the rehab center.  Patient can potentially be discharged to rehab after paracentesis is done.  Agree with DNR/DNI.  Thank you Dr. Jimmye Norman for allowing me to participate in the care of your pleasant patient. Please do not hesitate to contact me with questions or concerns in the interim.  Discussed with patient and his sister-in-law by the bedside in detail.  They are in agreement.

## 2021-11-13 NOTE — Progress Notes (Signed)
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  Ongoing  GOAL:   Patient will meet greater than or equal to 90% of their needs  Progressing   MONITOR:   PO intake, Supplement acceptance, Labs, Weight trends, Skin, I & O's  REASON FOR ASSESSMENT:   Malnutrition Screening Tool, Consult Assessment of nutrition requirement/status  ASSESSMENT:   71 y.o. male with history of stage 4 rectal cancer with peritoneal carcinomatosis on chemo (last: 11/01/21), anemia, and asthma. He presented to the ED due to feeling increasingly weak for several days, poor appetite, mild nausea. Admitted with working dx of weakness.  2/11- s/p BSE- advanced to dysphagia 2 diet with thin liquids  Reviewed I/O's: -1.5 L x 24 hours and +1.3 L since admission  UOP:1 .7 L x 24 hours   Pt receiving nursing care at time of visit. Pt is extremely emaciated.   Pt with variable oral intake. Noted meal completions 25-75%. Pt is refusing Boost Breeze and Prosource supplements.   Palliative care following for goals of care discussions. Pt desires short term SNF prior to returning home.   Medications reviewed.   Labs reviewed: Na: 134.    NUTRITION - FOCUSED PHYSICAL EXAM:  Flowsheet Row Most Recent Value  Orbital Region Severe depletion  Upper Arm Region Severe depletion  Thoracic and Lumbar Region Severe depletion  Buccal Region Severe depletion  Temple Region Severe depletion  Clavicle Bone Region Severe depletion  Clavicle  and Acromion Bone Region Severe depletion  Scapular Bone Region Severe depletion  Dorsal Hand Severe depletion  Patellar Region Severe depletion  Anterior Thigh Region Severe depletion  Posterior Calf Region Severe depletion  Edema (RD Assessment) Severe  Hair Reviewed  Eyes Reviewed  Mouth Reviewed  Skin Reviewed  Nails Reviewed       Diet Order:   Diet Order             DIET DYS 2 Room service appropriate? Yes; Fluid consistency: Thin  Diet effective now                   EDUCATION NEEDS:   No education needs have been identified at this time  Skin:  Skin Assessment: Reviewed RN Assessment  Last BM:  11/12/21  Height:   Ht Readings from Last 1 Encounters:  11/10/21 5\' 10"  (1.778 m)    Weight:   Wt Readings from Last 1 Encounters:  11/10/21 55.3 kg    Ideal Body Weight:  75.45 kg  BMI:  Body mass index is 17.51 kg/m.  Estimated Nutritional Needs:   Kcal:  2200-2400  Protein:  120-135 grams  Fluid:  > 2 L    Loistine Chance, RD, LDN, [place] Registered Dietitian II Certified Diabetes Care and Education Specialist Please refer to [facility] for RD and/or RD on-call/weekend/after hours pager

## 2021-11-14 ENCOUNTER — Inpatient Hospital Stay: Payer: Medicare PPO

## 2021-11-14 ENCOUNTER — Other Ambulatory Visit: Payer: Self-pay

## 2021-11-14 DIAGNOSIS — C786 Secondary malignant neoplasm of retroperitoneum and peritoneum: Secondary | ICD-10-CM

## 2021-11-14 DIAGNOSIS — E43 Unspecified severe protein-calorie malnutrition: Secondary | ICD-10-CM | POA: Insufficient documentation

## 2021-11-14 DIAGNOSIS — R531 Weakness: Secondary | ICD-10-CM | POA: Diagnosis not present

## 2021-11-14 DIAGNOSIS — J690 Pneumonitis due to inhalation of food and vomit: Secondary | ICD-10-CM | POA: Diagnosis not present

## 2021-11-14 LAB — BASIC METABOLIC PANEL WITH GFR
Anion gap: 3 — ABNORMAL LOW (ref 5–15)
BUN: 20 mg/dL (ref 8–23)
CO2: 28 mmol/L (ref 22–32)
Calcium: 7.9 mg/dL — ABNORMAL LOW (ref 8.9–10.3)
Chloride: 100 mmol/L (ref 98–111)
Creatinine, Ser: 0.65 mg/dL (ref 0.61–1.24)
GFR, Estimated: >60 mL/min
Glucose, Bld: 105 mg/dL — ABNORMAL HIGH (ref 70–99)
Potassium: 4 mmol/L (ref 3.5–5.1)
Sodium: 131 mmol/L — ABNORMAL LOW (ref 135–145)

## 2021-11-14 LAB — CBC
HCT: 27.2 % — ABNORMAL LOW (ref 39.0–52.0)
Hemoglobin: 8.9 g/dL — ABNORMAL LOW (ref 13.0–17.0)
MCH: 28 pg (ref 26.0–34.0)
MCHC: 32.7 g/dL (ref 30.0–36.0)
MCV: 85.5 fL (ref 80.0–100.0)
Platelets: 130 K/uL — ABNORMAL LOW (ref 150–400)
RBC: 3.18 MIL/uL — ABNORMAL LOW (ref 4.22–5.81)
RDW: 15.4 % (ref 11.5–15.5)
WBC: 4 K/uL (ref 4.0–10.5)
nRBC: 0 % (ref 0.0–0.2)

## 2021-11-14 MED ORDER — LIDOCAINE 5 % EX PTCH
1.0000 | MEDICATED_PATCH | CUTANEOUS | Status: DC
  Administered 2021-11-14 – 2021-11-16 (×3): 1 via TRANSDERMAL
  Filled 2021-11-14 (×4): qty 1

## 2021-11-14 NOTE — Progress Notes (Signed)
[facility]) Hospital Liaison note:  This is a pending outpatient-based Palliative Care patient. Will continue to follow for disposition.  Please call with any outpatient palliative questions or concerns.  Thank you, Lorelee Market, LPN [facility] Liaison [phone]

## 2021-11-14 NOTE — Progress Notes (Signed)
PROGRESS NOTE   HPI was taken from Dr. Hal Hope: Roberto Collins is a 71 y.o. 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  He denies some productive cough denies any shortness of breath or chest pain.   ED Course: In the ER patient appears nonfocal appears generally weak dehydrated and malnourished.  Abdomen appears benign on exam.  Labs show mild hypokalemia with potassium 3.4 high sensitive troponins were negative lactic acid normal hemoglobin around baseline chest x-ray showing possible infiltrates and mild pleural effusion.  Albumin is 2.6.  COVID test negative.  EKG shows normal sinus rhythm.  Patient admitted for generalized weakness likely from deconditioning poor appetite and possible aspiration pneumonia.  Hospital course from Dr. Jimmye Norman 2/11-2/14/23: Pt present w/ possibly aspiration pneumonia and has been treated w/ IV levaquin, bronchodilators and incentive spirometry. Pt has not required supplemental oxygen so far. Of note, pt has stage IV rectal carcinoma. Recently had chemo and was scheduled tomorrow for it which now be held. Onco will re-evaluate in 2 weeks as an outpatient. (Please see onco's note for more information). PT is now recommending SNF and CM is working on SNF placement    [name]  RJJ:[id number] DOB: [birth year] DOA: 11/10/2021 PCP: [name], PA-C   Assessment & Plan:   Principal Problem:   Weakness Active Problems:   Pulmonary emphysema ([place])   Peritoneal carcinomatosis ([place])   Pneumonia   Palliative care encounter   Protein-calorie malnutrition, severe   Possible aspiration pneumonia: improving. Continue on IV levaquin, bronchodilators & encourage incentive spirometry   Generalized weakness: PT is now recs SNF but  previously recommended HH   Rectal carcinoma: stage IV w/ peritoneal carcinomatosis. Recent chemo. Chemo scheduled for tomorrow is being held and onco will re-evaluate in 2 weeks as an outpatient as per onco. Poor prognosis   Bed bugs: s/p permethrin. Continue w/ contact precautions   HLD: not take statin currently   Severe protein calorie malnutrition: encourage po intake. Continue on nutritional supplement   Normocytic anemia: likely secondary to recent chemo.H&H are trending down. Will transfuse if Hb < 7.0.  Thrombocytopenia: likely secondary to recent chemo. Will continue to monitor   COPD: w/o exacerbation. Continue on bronchodilators, encourage flutter valve and incentive spirometry   DVT prophylaxis: lovenox  Code Status: DNR Family Communication: called pt's sister in law, Anne Ng but no answer so I left a voicemail  Disposition Plan: d/c to SNF   Level of care: Med-Surg  Status is: Inpatient Remains inpatient appropriate because: waiting on SNF placement     Consultants:  Onco  Palliative care  Procedures:   Antimicrobials:    Subjective: Pt c/o feeling sleepy and weak.    Objective: Vitals:   11/13/21 1601 11/13/21 2033 11/13/21 2321 11/14/21 0411  BP: 119/81 124/84 114/81 108/67  Pulse: 92 93 86 80  Resp: (!) 21  17 16   Temp: 98.3 F (36.8 C) 98.1 F (36.7 C) 98.8 F (37.1 C) 98.4 F (36.9 C)  TempSrc: Oral Oral Oral Oral  SpO2: 96% 98% 95% 95%  Weight:      Height:        Intake/Output Summary (Last 24 hours) at 11/14/2021 0728 Last data filed at 11/14/2021 0160 Gross per 24 hour  Intake 359.81 ml  Output  950 ml  Net -590.19 ml   Filed Weights   11/10/21 1118  Weight: 55.3 kg    Examination:  General exam: Appears disheveled & cachetic   Respiratory system: diminished breath sounds b/l  Cardiovascular system: S1/S2+. No rubs or gallops  Gastrointestinal system: abd is soft, NT, ND & hypoactive bowel sounds Central nervous system:  alert and oriented. Moves all extremities  Psychiatry: judgement and insight appears normal. Flat mood and affect    Data Reviewed: I have personally reviewed following labs and imaging studies  CBC: Recent Labs  Lab 11/08/21 0818 11/10/21 1244 11/10/21 1528 11/11/21 0559 11/13/21 0340 11/14/21 0420  WBC 7.2 5.9 4.7 4.2 4.6 4.0  NEUTROABS 6.2  --   --   --   --   --   HGB 10.8* 10.3* 9.5* 9.9* 9.7* 8.9*  HCT 32.7* 31.0* 28.1* 29.6* 29.0* 27.2*  MCV 86.7 87.8 87.0 87.8 85.5 85.5  PLT 166 170 149* 147* 131* 300*   Basic Metabolic Panel: Recent Labs  Lab 11/08/21 0818 11/10/21 1244 11/10/21 1528 11/11/21 0559 11/13/21 0340 11/14/21 0420  NA 135 137  --  136 134* 131*  K 3.5 3.4*  --  3.6 3.9 4.0  CL 101 102  --  102 102 100  CO2 26 22  --  27 27 28   GLUCOSE 109* 96  --  98 95 105*  BUN 25* 20  --  18 19 20   CREATININE 0.64 0.32* 0.39* 0.58* 0.60* 0.65  CALCIUM 8.2* 7.9*  --  7.9* 7.9* 7.9*   GFR: Estimated Creatinine Clearance: 68.2 mL/min (by C-G formula based on SCr of 0.65 mg/dL). Liver Function Tests: Recent Labs  Lab 11/08/21 0818 11/10/21 1244 11/11/21 0559  AST 24 19 18   ALT 18 16 15   ALKPHOS 64 58 54  BILITOT 0.3 0.4 0.4  PROT 6.1* 5.7* 5.4*  ALBUMIN 2.8* 2.6* 2.5*   No results for input(s): LIPASE, AMYLASE in the last 168 hours. No results for input(s): AMMONIA in the last 168 hours. Coagulation Profile: No results for input(s): INR, PROTIME in the last 168 hours. Cardiac Enzymes: No results for input(s): CKTOTAL, CKMB, CKMBINDEX, TROPONINI in the last 168 hours. BNP (last 3 results) No results for input(s): PROBNP in the last 8760 hours. HbA1C: No results for input(s): HGBA1C in the last 72 hours. CBG: No results for input(s): GLUCAP in the last 168 hours. Lipid Profile: No results for input(s): CHOL, HDL, LDLCALC, TRIG, CHOLHDL, LDLDIRECT in the last 72 hours. Thyroid Function Tests: No results for input(s): TSH, T4TOTAL, FREET4, T3FREE,  THYROIDAB in the last 72 hours. Anemia Panel: No results for input(s): VITAMINB12, FOLATE, FERRITIN, TIBC, IRON, RETICCTPCT in the last 72 hours. Sepsis Labs: Recent Labs  Lab 11/10/21 1357  LATICACIDVEN 0.7    Recent Results (from the past 240 hour(s))  Resp Panel by RT-PCR (Flu A&B, Covid) Nasopharyngeal Swab     Status: None   Collection Time: 11/10/21 12:44 PM   Specimen: Nasopharyngeal Swab; Nasopharyngeal(NP) swabs in vial transport medium  Result Value Ref Range Status   SARS Coronavirus 2 by RT PCR NEGATIVE NEGATIVE Final    Comment: (NOTE) SARS-CoV-2 target nucleic acids are NOT DETECTED.  The SARS-CoV-2 RNA is generally detectable in upper respiratory specimens during the acute phase of infection. The lowest concentration of SARS-CoV-2 viral copies this assay can detect is 138 copies/mL. A negative result does not preclude SARS-Cov-2 infection and should not be used as the sole basis for treatment  or other patient management decisions. A negative result may occur with  improper specimen collection/handling, submission of specimen other than nasopharyngeal swab, presence of viral mutation(s) within the areas targeted by this assay, and inadequate number of viral copies(<138 copies/mL). A negative result must be combined with clinical observations, patient history, and epidemiological information. The expected result is Negative.  Fact Sheet for Patients:  EntrepreneurPulse.com.au  Fact Sheet for Healthcare Providers:  IncredibleEmployment.be  This test is no t yet approved or cleared by the [place] FDA and  has been authorized for detection and/or diagnosis of SARS-CoV-2 by FDA under an Emergency Use Authorization (EUA). This EUA will remain  in effect (meaning this test can be used) for the duration of the COVID-19 declaration under Section 564(b)(1) of the Act, 21 U.S.C.section 360bbb-3(b)(1), unless the authorization is  terminated  or revoked sooner.       Influenza A by PCR NEGATIVE NEGATIVE Final   Influenza B by PCR NEGATIVE NEGATIVE Final    Comment: (NOTE) The Xpert Xpress SARS-CoV-2/FLU/RSV plus assay is intended as an aid in the diagnosis of influenza from Nasopharyngeal swab specimens and should not be used as a sole basis for treatment. Nasal washings and aspirates are unacceptable for Xpert Xpress SARS-CoV-2/FLU/RSV testing.  Fact Sheet for Patients: EntrepreneurPulse.com.au  Fact Sheet for Healthcare Providers: IncredibleEmployment.be  This test is not yet approved or cleared by the [place] FDA and has been authorized for detection and/or diagnosis of SARS-CoV-2 by FDA under an Emergency Use Authorization (EUA). This EUA will remain in effect (meaning this test can be used) for the duration of the COVID-19 declaration under Section 564(b)(1) of the Act, 21 U.S.C. section 360bbb-3(b)(1), unless the authorization is terminated or revoked.  Performed at [facility], [address] [place], [place] [address]   Blood culture (routine x 2)     Status: None (Preliminary result)   Collection Time: 11/10/21  1:57 PM   Specimen: BLOOD  Result Value Ref Range Status   Specimen Description   Final    BLOOD CENTRAL LINE Performed at [facility], [address] [place], [place] [address]    Special Requests   Final    BOTTLES DRAWN AEROBIC AND ANAEROBIC Blood Culture results may not be optimal due to an excessive volume of blood received in culture bottles Performed at [facility], [address], [place], [place] [address]    Culture   Final    NO GROWTH 4 DAYS Performed at [facility], [place] [address] [place], [place] [address]    Report Status PENDING  Incomplete  Blood culture (routine x 2)     Status: None (Preliminary result)   Collection Time: 11/10/21  1:57 PM   Specimen: BLOOD  Result Value Ref  Range Status   Specimen Description BLOOD CENTRAL LINE  Final   Special Requests   Final    BOTTLES DRAWN AEROBIC AND ANAEROBIC Blood Culture results may not be optimal due to an excessive volume of blood received in culture bottles   Culture   Final    NO GROWTH 3 DAYS Performed at [facility], [address] [place], [place] [address]    Report Status PENDING  Incomplete         Radiology Studies: No results found.      Scheduled Meds:  Chlorhexidine Gluconate Cloth  6 each Topical Daily   cyclobenzaprine  10 mg Oral QHS   doxepin  25 mg  Oral QHS   enoxaparin (LOVENOX) injection  40 mg Subcutaneous Q24H   feeding supplement  1 Container Oral TID BM   gabapentin  1,800 mg Oral QHS   guaiFENesin  1,200 mg Oral BID   loratadine  10 mg Oral Daily   morphine  60 mg Oral Q12H   multivitamin with minerals  1 tablet Oral Daily   pantoprazole  40 mg Oral Daily   Continuous Infusions:  albumin human     levofloxacin (LEVAQUIN) IV Stopped (11/13/21 1450)     LOS: 3 days    Time spent: 15 mins    Wyvonnia Dusky, MD [facility] Pager [phone]  If 7PM-7AM, please contact night-coverage 11/14/2021, 7:28 AM

## 2021-11-14 NOTE — TOC Progression Note (Signed)
Transition of Care [facility]) - Progression Note    Patient Details  Name: Roberto Collins MRN: [id number] Date of Birth: [birth year]  [facility] CM/SW Contact  Eileen Stanford, LCSW Phone Number: 11/14/2021, 10:57 AM  Clinical Narrative:   [facility] has offered pt a bed. CSW starting insurance auth.    Expected Discharge Plan: [place] Barriers to Discharge: Continued Medical Work up  Expected Discharge Plan and Services Expected Discharge Plan: [place] In-house Referral: Clinical Social Work   Post Acute Care Choice: [place] Living arrangements for the past 2 months: Single Family Home                                       Social Determinants of Health (SDOH) Interventions    Readmission Risk Interventions No flowsheet data found.

## 2021-11-14 NOTE — Progress Notes (Signed)
Occupational Therapy Treatment Patient Details Name: Roberto Collins MRN: [id number] DOB: Jan 05, 1951 Today's Date: 11/14/2021   History of present illness Pt is a 71 y/o M admitted on 11/10/21 after presenting with c/c of weakness; likely 2/2 deconditioning, poor appetitie & possible aspiration PNA. 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 Upon arrival to room pt seated in chair, agreeable to tx. Pt requires CGA toilet t/f and perihygiene in standing. Pt requires cues for safety and good hygiene habits. Pt tolerates ~3 min washing bottom/arms in standing, noted increased WOB and SpO2 in 80s, returned to bed. RN notifed and in room to assess. Pt making progress toward goals. Pt continues to benefit from skilled OT services to maximize return to PLOF and minimize risk of future falls, injury, caregiver burden, and readmission. Will continue to follow POC. Discharge recommendation updated to reflect poor activity tolerance.     Recommendations for follow up therapy are one component of a multi-disciplinary discharge planning process, led by the attending physician.  Recommendations may be updated based on patient status, additional functional criteria and insurance authorization.    Follow Up Recommendations  Skilled nursing-short term rehab (<3 hours/day)    Assistance Recommended at Discharge Intermittent Supervision/Assistance  Patient can return home with the following  A little help with walking and/or transfers;A little help with bathing/dressing/bathroom;Assistance with cooking/housework;Assist for transportation;Help with stairs or ramp for entrance   Equipment Recommendations  None recommended by OT    Recommendations for Other Services      Precautions / Restrictions Precautions Precautions: Fall Restrictions Weight Bearing Restrictions: No       Mobility Bed Mobility Overal  bed mobility: Modified Independent                  Transfers Overall transfer level: Needs assistance Equipment used: None Transfers: Sit to/from Stand Sit to Stand: Min guard                 Balance Overall balance assessment: Needs assistance Sitting-balance support: Feet supported Sitting balance-Leahy Scale: Good     Standing balance support: No upper extremity supported, During functional activity Standing balance-Leahy Scale: Fair                             ADL either performed or assessed with clinical judgement   ADL Overall ADL's : Needs assistance/impaired                                       General ADL Comments: CGA toilet t/f and hygiene in standing. Pt requires cues for safety and good hygiene habits. Pt tolerates ~3 min standing, noted increased WOB and SpO2 in 80s, returned to sitting.      Cognition Arousal/Alertness: Awake/alert Behavior During Therapy: WFL for tasks assessed/performed Overall Cognitive Status: Within Functional Limits for tasks assessed                                 General Comments: poor hygiene awareness - pt wipes bottom then wipes same wipe across buttocks/thighs         Pertinent Vitals/ Pain       Pain Assessment Pain Assessment: No/denies pain  Frequency  Min 2X/week        Progress Toward Goals  OT Goals(current goals can now be found in the care plan section)  Progress towards OT goals: Progressing toward goals  Acute Rehab OT Goals Patient Stated Goal: to get better OT Goal Formulation: With patient Time For Goal Achievement: 11/25/21 Potential to Achieve Goals: Good ADL Goals Additional ADL Goal #1: Pt will verbalize plan to implement at least 2 learned ECS into daily ADL/IADL routine to minimize falls risk and maximize independence. Additional ADL Goal #2: Pt will verbalize plan to implement at least 1 learned falls prevention strategy to maximzie  safety/indep during ADL/IADL.  Plan Discharge plan needs to be updated;Frequency remains appropriate    Co-evaluation                 AM-PAC OT "6 Clicks" Daily Activity     Outcome Measure   Help from another person eating meals?: None Help from another person taking care of personal grooming?: A Little Help from another person toileting, which includes using toliet, bedpan, or urinal?: A Little Help from another person bathing (including washing, rinsing, drying)?: A Little Help from another person to put on and taking off regular upper body clothing?: A Little Help from another person to put on and taking off regular lower body clothing?: A Little 6 Click Score: 19    End of Session    OT Visit Diagnosis: Other abnormalities of gait and mobility (R26.89);Muscle weakness (generalized) (M62.81)   Activity Tolerance Patient tolerated treatment well   Patient Left in bed;with call bell/phone within reach;with bed alarm set;with nursing/sitter in room   Nurse Communication Other (comment) (O2)        Time: 2836-6294 OT Time Calculation (min): 19 min  Charges: OT General Charges $OT Visit: 1 Visit OT Treatments $Self Care/Home Management : 8-22 mins  Dessie Coma, M.S. OTR/L  11/14/21, 3:51 PM  ascom [phone]

## 2021-11-15 ENCOUNTER — Inpatient Hospital Stay: Payer: Medicare PPO

## 2021-11-15 ENCOUNTER — Encounter: Payer: Medicare PPO | Admitting: Hospice and Palliative Medicine

## 2021-11-15 ENCOUNTER — Inpatient Hospital Stay: Payer: Medicare PPO | Admitting: Internal Medicine

## 2021-11-15 DIAGNOSIS — C786 Secondary malignant neoplasm of retroperitoneum and peritoneum: Secondary | ICD-10-CM

## 2021-11-15 DIAGNOSIS — J449 Chronic obstructive pulmonary disease, unspecified: Secondary | ICD-10-CM

## 2021-11-15 LAB — CBC
HCT: 27.3 % — ABNORMAL LOW (ref 39.0–52.0)
Hemoglobin: 9.1 g/dL — ABNORMAL LOW (ref 13.0–17.0)
MCH: 28.3 pg (ref 26.0–34.0)
MCHC: 33.3 g/dL (ref 30.0–36.0)
MCV: 85 fL (ref 80.0–100.0)
Platelets: 132 10*3/uL — ABNORMAL LOW (ref 150–400)
RBC: 3.21 MIL/uL — ABNORMAL LOW (ref 4.22–5.81)
RDW: 15.2 % (ref 11.5–15.5)
WBC: 3.2 10*3/uL — ABNORMAL LOW (ref 4.0–10.5)
nRBC: 0 % (ref 0.0–0.2)

## 2021-11-15 LAB — BASIC METABOLIC PANEL
Anion gap: 7 (ref 5–15)
BUN: 19 mg/dL (ref 8–23)
CO2: 26 mmol/L (ref 22–32)
Calcium: 7.8 mg/dL — ABNORMAL LOW (ref 8.9–10.3)
Chloride: 99 mmol/L (ref 98–111)
Creatinine, Ser: 0.54 mg/dL — ABNORMAL LOW (ref 0.61–1.24)
GFR, Estimated: >60 mL/min (ref >60)
Glucose, Bld: 116 mg/dL — ABNORMAL HIGH (ref 70–99)
Potassium: 3.8 mmol/L (ref 3.5–5.1)
Sodium: 132 mmol/L — ABNORMAL LOW (ref 135–145)

## 2021-11-15 LAB — CULTURE, BLOOD (ROUTINE X 2)
Culture: NO GROWTH
Culture: NO GROWTH

## 2021-11-15 LAB — RESP PANEL BY RT-PCR (FLU A&B, COVID) ARPGX2
Influenza A by PCR: NEGATIVE
Influenza B by PCR: NEGATIVE
SARS Coronavirus 2 by RT PCR: NEGATIVE

## 2021-11-15 MED ORDER — MORPHINE SULFATE ER 60 MG PO TBCR: 60.0000 mg | EXTENDED_RELEASE_TABLET | Freq: Two times a day (BID) | ORAL | 0 refills | Status: AC

## 2021-11-15 MED ORDER — IPRATROPIUM-ALBUTEROL 0.5-2.5 (3) MG/3ML IN SOLN: 3.0000 mL | Freq: Four times a day (QID) | RESPIRATORY_TRACT | Status: AC | PRN

## 2021-11-15 MED ORDER — SPIRIVA HANDIHALER 18 MCG IN CAPS
18.0000 ug | ORAL_CAPSULE | Freq: Every day | RESPIRATORY_TRACT | 0 refills | Status: DC
Start: 2021-11-15 — End: 2021-11-17

## 2021-11-15 MED ORDER — OXYCODONE-ACETAMINOPHEN 5-325 MG PO TABS: 1.0000 | ORAL_TABLET | ORAL | 0 refills | Status: AC | PRN

## 2021-11-15 MED ORDER — PANTOPRAZOLE SODIUM 20 MG PO TBEC: 40.0000 mg | DELAYED_RELEASE_TABLET | Freq: Every day | ORAL | 2 refills | Status: AC

## 2021-11-15 MED ORDER — LORATADINE 10 MG PO TABS: 10.0000 mg | ORAL_TABLET | Freq: Every day | ORAL | Status: AC

## 2021-11-15 MED ORDER — GUAIFENESIN ER 600 MG PO TB12: 1200.0000 mg | ORAL_TABLET | Freq: Two times a day (BID) | ORAL | 0 refills | Status: AC

## 2021-11-15 NOTE — Progress Notes (Signed)
Pt c/o severe pain 8/10 in lower back completely different from the chronic back pain that he experiences. On assessment, this nurse noted marked redness to the lumbar spine, redness is blanchable, but looks raw/inflamed. Also, scattered red spots on back from bug bites. Notified Neomia Glass, FNP who evaluated patient at bedside, see new orders. Patient repositioned in bed and eventually up to chair.

## 2021-11-15 NOTE — TOC Progression Note (Signed)
Transition of Care [facility]) - Progression Note    Patient Details  Name: Roberto Collins MRN: [id number] Date of Birth: 11/17/1950  [facility]) CM/SW Contact  Eileen Stanford, LCSW Phone Number: 11/15/2021, 4:24 PM  Clinical Narrative:   [facility] called and states they weren't aware until now that pt has bed bugs and doesn't have a private room for pt therefore pt can not admit today.    Expected Discharge Plan: [place] Barriers to Discharge: No Barriers Identified  Expected Discharge Plan and Services Expected Discharge Plan: [place] In-house Referral: Clinical Social Work   Post Acute Care Choice: [place] Living arrangements for the past 2 months: Single Family Home Expected Discharge Date: 11/15/21                                     Social Determinants of Health (SDOH) Interventions    Readmission Risk Interventions No flowsheet data found.

## 2021-11-15 NOTE — TOC Progression Note (Signed)
Transition of Care [facility]) - Progression Note    Patient Details  Name: [name] MRN: [id number] Date of Birth: July 02, 1951  [facility]) CM/SW Contact  Eileen Stanford, LCSW Phone Number: 11/15/2021, 3:00 PM  Clinical Narrative:   [facility] has a bed for pt today.    Expected Discharge Plan: [place] Barriers to Discharge: Continued Medical Work up  Expected Discharge Plan and Services Expected Discharge Plan: [place] In-house Referral: Clinical Social Work   Post Acute Care Choice: [place] Living arrangements for the past 2 months: Single Family Home                                       Social Determinants of Health (SDOH) Interventions    Readmission Risk Interventions No flowsheet data found.

## 2021-11-15 NOTE — Progress Notes (Signed)
Report called to [facility], LPN at [employer]. Management consultant. Right Chest Port  a cath deaccessed. Dsg applied.

## 2021-11-15 NOTE — Care Management Important Message (Signed)
Important Message  Patient Details  Name: Roberto Collins MRN: [id number] Date of Birth: [birth year]   Medicare Important Message Given:  Yes  I reviewed the Important Message from Medicare with the patient's sister, Bary Leriche by phone [phone] and she is agreement with the discharge. I asked if she would like a copy and she replied yes, so I sent a copy via secure e-mail to [email or web address] as directed.  I thanked her for her time.   Juliann Pulse A Brinleigh Tew 11/15/2021, 11:38 AM

## 2021-11-15 NOTE — TOC Transition Note (Signed)
Transition of Care [facility]) - CM/SW Discharge Note   Patient Details  Name: Roberto Collins MRN: [id number] Date of Birth: May 13, 1951  [facility]) CM/SW Contact:  Eileen Stanford, LCSW Phone Number: 11/15/2021, 3:35 PM   Clinical Narrative:   Clinical Social Worker facilitated patient discharge including contacting patient family and facility to confirm patient discharge plans.  Clinical information faxed to facility and family agreeable with plan.  CSW arranged ambulance transport via [facility] to [facility] (room 605 B) .  RN to call [phone] for report prior to discharge.     Final next level of care: Skilled Nursing Facility Barriers to Discharge: No Barriers Identified   Patient Goals and CMS Choice Patient states their goals for this hospitalization and ongoing recovery are:: to go to rehab CMS Medicare.gov Compare Post Acute Care list provided to:: Patient Represenative (must comment) (sister) Choice offered to / list presented to : Sibling  Discharge Placement              Patient chooses bed at:  ([facility]) Patient to be transferred to facility by: [facility]   Patient and family notified of of transfer: 11/15/21  Discharge Plan and Services In-house Referral: Clinical Social Work   Post Acute Care Choice: [place]                               Social Determinants of Health (SDOH) Interventions     Readmission Risk Interventions No flowsheet data found.

## 2021-11-15 NOTE — Discharge Summary (Signed)
Physician Discharge Summary  Roberto Collins ERX:[id number] DOB: 1951/07/02 DOA: 11/10/2021  PCP: [name], PA-C  Admit date: 11/10/2021 Discharge date: 11/15/2021  Time spent: 55 minutes  Recommendations for Outpatient Follow-up:  Patient be discharged to skilled nursing facility.  Follow-up with MD at SNF. Palliative care to follow-up in the outpatient setting. Follow-up with Dr. Rogue Bussing in 2 weeks.   Discharge Diagnoses:  Principal Problem:   Weakness Active Problems:   Pulmonary emphysema (HCC)   Peritoneal carcinomatosis (HCC)   Pneumonia   Palliative care encounter   Protein-calorie malnutrition, severe   Chronic obstructive pulmonary disease ([place])   Discharge Condition: Stable  Diet recommendation: Dysphagia 2 diet  Filed Weights   11/10/21 1118  Weight: 55.3 kg    History of present illness:  HPI per Dr. Debbora Lacrosse Hargrove is a 71 y.o. 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  He denies some productive cough denies any shortness of breath or chest pain.   ED Course: In the ER patient appears nonfocal appears generally weak dehydrated and malnourished.  Abdomen appears benign on exam.  Labs show mild hypokalemia with potassium 3.4 high sensitive troponins were negative lactic acid normal hemoglobin around baseline chest x-ray showing possible infiltrates and mild pleural effusion.  Albumin is 2.6.  COVID test negative.  EKG shows normal sinus rhythm.  Patient admitted for generalized weakness likely from deconditioning poor appetite and possible aspiration pneumonia.  Hospital Course:  #1 probable aspiration pneumonia -Patient admitted with generalized weakness, poor oral intake.  Work-up done on presentation seem nonfocal.   Labs showed a hypokalemia with potassium of 3.4.  Chest x-ray concerning for possible infiltrates and mild pleural effusion.  COVID-19 PCR negative. -Patient admitted placed on IV antibiotics and completed a 5-day course of IV Levaquin. -Patient remained afebrile. -Patient be discharged in stable and improved condition with no further antibiotics required on discharge.  2.  Generalized weakness -Patient seen by PT/OT who recommended SNF placement.  3.  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 -Patient was scheduled for chemotherapy which was held. -Patient seen in consultation by oncology who assessed and followed the patient during the hospitalization and patient noted to have overall poor prognosis. -Oncology discussed with patient that patient will be reevaluated in the outpatient setting to see if he is able to restart chemotherapy and if patient's condition deteriorates chemotherapy will be discontinued and patient would not be eligible for any further chemotherapy and hospice will be recommended at that time. -Patient to be discharged to skilled nursing facility with hopes of some clinical improvement with palliative care following in the outpatient setting. -Patient will follow-up with oncology 2 weeks post discharge.  4.  Hyperlipidemia -It was noted that patient not taking statin.  5.  Bedbugs -Status post permethrin and patient was placed on contact precautions. -If needed permethrin may be reordered 2 weeks from initial dose.  6.  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 -Patient maintained on nutritional supplementation.  7.  Normocytic anemia/pancytopenia -Fall likely secondary to recent chemotherapy. -Hemoglobin stabilized at 9.1. -Outpatient follow-up with hematology/oncology.  8.  COPD -Remained stable during the hospitalization. -  Patient maintained on bronchodilators as well as flutter valve and incentive spirometry.  Procedures: -Abdominal ultrasound 11/14/2021 Lower extremity Dopplers 11/10/2021 Plain films of the abdomen 11/10/2021 Chest x-ray 11/10/2021 11/10/2021    Consultations: Oncology: Dr. Rogue Bussing 11/13/2021  Discharge Exam: Vitals:   11/15/21 0749 11/15/21 1200  BP: 103/75 126/84  Pulse: 87 94  Resp: 16 15  Temp: 98.6 F (37 C) 98.6 F (37 C)  SpO2: 98% 98%    General: Frail.  Emaciated.  Cachectic.  Chronically ill-appearing.  Barrel chest. Cardiovascular: Regular rate rhythm no murmurs rubs or gallops.  No JVD.  No lower extremity edema. Respiratory: Lungs clear to auscultation bilaterally.  No wheezes, no crackles, no rhonchi.  Fair air movement.  Discharge Instructions   Discharge Instructions     Diet general   Complete by: As directed    Dysphagia 2 diet   Increase activity slowly   Complete by: As directed       Allergies as of 11/15/2021       Reactions   Bee Venom Anaphylaxis   Penicillins Anaphylaxis, Swelling   Did it involve swelling of the face/tongue/throat, SOB, or low BP? Yes Did it involve sudden or severe rash/hives, skin peeling, or any reaction on the inside of your mouth or nose? No Did you need to seek medical attention at a hospital or doctor's office? Yes When did it last happen?  Many years ago If all above answers are "NO", may proceed with cephalosporin use.        Medication List     STOP taking these medications    chlorproMAZINE 100 MG tablet Commonly known as: THORAZINE   Clenpiq 10-3.5-12 MG-GM -GM/160ML Soln Generic drug: Sod Picosulfate-Mag Ox-Cit Acd   nystatin 100000 UNIT/ML suspension Commonly known as: MYCOSTATIN   rOPINIRole 1 MG tablet Commonly known as: REQUIP   rosuvastatin 5 MG tablet Commonly known as: Crestor       TAKE these medications    albuterol 108 (90  Base) MCG/ACT inhaler Commonly known as: VENTOLIN HFA Inhale 2 puffs into the lungs every 6 (six) hours as needed for wheezing or shortness of breath.   aspirin EC 81 MG tablet Take 81 mg by mouth daily. Swallow whole.   cyclobenzaprine 10 MG tablet Commonly known as: FLEXERIL Take 10 mg by mouth at bedtime.   cyclobenzaprine 10 MG tablet Commonly known as: FLEXERIL Take 10 mg by mouth 3 (three) times daily as needed for muscle spasms.   dexamethasone 4 MG tablet Commonly known as: DECADRON Take 2 tablets (8 mg total) by mouth daily. Start the day after chemotherapy for 2 days. Take with food.   doxepin 25 MG capsule Commonly known as: SINEQUAN Take 25 mg by mouth at bedtime.   fluticasone-salmeterol 250-50 MCG/ACT Aepb Commonly known as: ADVAIR Inhale 1 puff into the lungs in the morning and at bedtime.   gabapentin 600 MG tablet Commonly known as: NEURONTIN Take 1,800 mg by mouth at bedtime.   guaiFENesin 600 MG 12 hr tablet Commonly known as: MUCINEX Take 2 tablets (1,200 mg total) by mouth 2 (two) times daily for 3 days.   ipratropium-albuterol 0.5-2.5 (3) MG/3ML Soln Commonly known as: DUONEB Take 3 mLs by nebulization every 6 (six) hours as needed.   lidocaine-prilocaine cream Commonly known as: EMLA Apply to affected area once   loratadine 10 MG tablet Commonly known as: CLARITIN Take 1 tablet (10 mg total) by mouth daily. Start taking on: November 16, 2021  morphine 60 MG 12 hr tablet Commonly known as: MS CONTIN Take 1 tablet (60 mg total) by mouth every 12 (twelve) hours.   ondansetron 8 MG tablet Commonly known as: Zofran Take 1 tablet (8 mg total) by mouth 2 (two) times daily as needed for refractory nausea / vomiting. Start on day 3 after chemotherapy.   oxyCODONE-acetaminophen 5-325 MG tablet Commonly known as: PERCOCET/ROXICET Take 1 tablet by mouth every 4 (four) hours as needed for severe pain.   pantoprazole 20 MG tablet Commonly known  as: Protonix Take 2 tablets (40 mg total) by mouth daily. What changed: how much to take   prochlorperazine 10 MG tablet Commonly known as: COMPAZINE Take 1 tablet (10 mg total) by mouth every 6 (six) hours as needed (Nausea or vomiting).   Spiriva HandiHaler 18 MCG inhalation capsule Generic drug: tiotropium Place 1 capsule (18 mcg total) into inhaler and inhale daily.   Vitamin D 50 MCG (2000 UT) Caps Take 2,000 Units by mouth daily.       Allergies  Allergen Reactions   Bee Venom Anaphylaxis   Penicillins Anaphylaxis and Swelling    Did it involve swelling of the face/tongue/throat, SOB, or low BP? Yes Did it involve sudden or severe rash/hives, skin peeling, or any reaction on the inside of your mouth or nose? No Did you need to seek medical attention at a hospital or doctor's office? Yes When did it last happen?  Many years ago If all above answers are "NO", may proceed with cephalosporin use.     Contact information for follow-up providers     MD AT SNF Follow up.          Cammie Sickle, MD. Schedule an appointment as soon as possible for a visit in 2 week(s).   Specialties: Internal Medicine, Oncology Why: Follow-up in 2 to 3 weeks OR AS SCHEDULED Contact information: [place] [address] [phone]         Palliative Care Follow up.               Contact information for after-discharge care     Destination     [facility] Preferred SNF .   Service: Skilled Nursing Contact information: [address] [place] [phone]                      The results of significant diagnostics from this hospitalization (including imaging, microbiology, ancillary and laboratory) are listed below for reference.    Significant Diagnostic Studies: DG Abd 1 View  Result Date: 11/10/2021 CLINICAL DATA:  Generalized weakness. History of stage IV gastric cancer. EXAM:  ABDOMEN - 1 VIEW COMPARISON:  None. FINDINGS: The bowel gas pattern is normal. No radio-opaque calculi or other significant radiographic abnormality are seen. IMPRESSION: No abnormal bowel dilatation is noted. Electronically Signed   By: Marijo Conception M.D.   On: 11/10/2021 14:56   CT ABDOMEN PELVIS W CONTRAST  Result Date: 11/10/2021 CLINICAL DATA:  Nausea/vomiting weakness EXAM: CT ABDOMEN AND PELVIS WITH CONTRAST TECHNIQUE: Multidetector CT imaging of the abdomen and pelvis was performed using the standard protocol following bolus administration of intravenous contrast. RADIATION DOSE REDUCTION: This exam was performed according to the departmental dose-optimization program which includes automated exposure control, adjustment of the mA and/or kV according to patient size and/or use of iterative reconstruction technique. CONTRAST:  27mL OMNIPAQUE IOHEXOL 300 MG/ML  SOLN COMPARISON:  CT 09/19/2021 FINDINGS:  Lower chest: There is a new moderate size right pleural effusion with adjacent basilar atelectasis. Hepatobiliary: Scalloping of the liver parenchymal contours related to malignant ascites. The gallbladder is unremarkable. Pancreas: Unremarkable. No pancreatic ductal dilatation or surrounding inflammatory changes. Spleen: Normal in size without focal abnormality. Adrenals/Urinary Tract: Adrenal glands are unremarkable. Kidneys are normal, without renal calculi, focal lesion, or hydronephrosis. The bladder is mildly distended but unremarkable. Stomach/Bowel: No evidence of bowel obstruction. Displacement of the viscera due to viscous/malignant ascites. The appendix appears decompressed. Known colon cancer primary is not well visualized by CT but possibly representative on coronal image 53 where there is focal wall thickening near the rectosigmoid junction. Vascular/Lymphatic: Aortoiliac atherosclerotic calcifications. No AAA. Reproductive: Unremarkable. Other: Cachectic appearance. Large volume  abdominopelvic exudate of ascites, increased from prior exam, with diffuse peritoneal carcinomatosis and omental caking. Increased thickening of peritoneal metastases along the right upper quadrant, measuring 1.5 cm in thickness, previously 0.9 cm (series 2, image 22). Increased peritoneal soft tissue density in the pelvis. Musculoskeletal: Prior L4-S1 lumbar fusion. Moderate severe degenerative disc disease at L2-L3. Unchanged chronic right pelvic defect. IMPRESSION: New moderate size right pleural effusion with adjacent basilar atelectasis. Worsening diffuse peritoneal carcinomatosis. Increased, large volume malignant ascites. No evidence of bowel obstruction. Electronically Signed   By: Maurine Simmering M.D.   On: 11/10/2021 16:56   US Venous Img Lower Bilateral (DVT)  Result Date: 11/10/2021 CLINICAL DATA:  Pain EXAM: Bilateral LOWER EXTREMITY VENOUS DOPPLER ULTRASOUND TECHNIQUE: Gray-scale sonography with compression, as well as color and duplex ultrasound, were performed to evaluate the deep venous system(s) from the level of the common femoral vein through the popliteal and proximal calf veins. COMPARISON:  None. FINDINGS: VENOUS Normal compressibility of the common femoral, superficial femoral, and popliteal veins, as well as the visualized calf veins on both sides. Visualized portions of profunda femoral vein and great saphenous vein unremarkable. No filling defects to suggest DVT on grayscale or color Doppler imaging. Doppler waveforms show normal direction of venous flow, normal respiratory plasticity and response to augmentation. . OTHER None. Limitations: none IMPRESSION: There is no evidence of deep venous thrombosis in both lower extremities. Electronically Signed   By: Elmer Picker M.D.   On: 11/10/2021 17:41   CT BIOPSY  Result Date: 10/27/2021 CLINICAL DATA:  Peritoneal carcinomatosis and ascites of unknown primary with prior paracentesis demonstrating malignant cells in ascites. Material  was not sufficient for additional molecular analysis and core biopsy has been requested for further tissue analysis. EXAM: CT GUIDED CORE BIOPSY OF PERITONEAL/OMENTAL TUMOR ANESTHESIA/SEDATION: Moderate (conscious) sedation was employed during this procedure. A total of Versed 1.0 mg and Fentanyl 50 mcg was administered intravenously by radiology nursing. Moderate Sedation Time: 12 minutes. The patient's level of consciousness and vital signs were monitored continuously by radiology nursing throughout the procedure under my direct supervision. PROCEDURE: The procedure risks, benefits, and alternatives were explained to the patient. Questions regarding the procedure were encouraged and answered. The patient understands and consents to the procedure. A time-out was performed prior to initiating the procedure. CT was performed through the abdomen and pelvis. The left lower abdominal wall was prepped with chlorhexidine in a sterile fashion, and a sterile drape was applied covering the operative field. A sterile gown and sterile gloves were used for the procedure. Local anesthesia was provided with 1% Lidocaine. Under CT guidance, a 17 gauge trocar needle was advanced from a left anterior approach into the left peritoneal cavity. After confirming needle tip position, coaxial  18 gauge core biopsy samples were obtained. Four separate core biopsy samples were obtained and submitted in formalin. COMPLICATIONS: None FINDINGS: A region of peritoneal tumor demonstrating increased metabolic activity by PET scan in the left lateral lower peritoneal cavity was targeted. Solid, mucinous appearing tumor was obtained. IMPRESSION: CT-guided core biopsy performed peritoneal/omental tumor in the left lateral lower peritoneal cavity. Solid tissue was obtained. RADIATION DOSE REDUCTION: This exam was performed according to the departmental dose-optimization program which includes automated exposure control, adjustment of the mA and/or kV  according to patient size and/or use of iterative reconstruction technique. Electronically Signed   By: Aletta Edouard M.D.   On: 10/27/2021 13:34   DG Chest Port 1 View  Result Date: 11/10/2021 CLINICAL DATA:  Weakness EXAM: PORTABLE CHEST 1 VIEW COMPARISON:  Chest x-ray dated August 03, 2019 FINDINGS: Cardiac and mediastinal contours are within normal limits. Right chest wall port with tip positioned near the superior cavoatrial junction. New right basilar opacity. Small right pleural effusion. IMPRESSION: 1. New right basilar opacity, concerning for infection or aspiration. 2. Small right pleural effusion. Electronically Signed   By: Yetta Glassman M.D.   On: 11/10/2021 12:39   [place] ASCITES (ABDOMEN LIMITED)  Result Date: 11/14/2021 CLINICAL DATA:  71 year old male referred for possible paracentesis with known malignancy. EXAM: LIMITED ABDOMEN ULTRASOUND FOR ASCITES TECHNIQUE: Limited ultrasound survey for ascites was performed in all four abdominal quadrants. COMPARISON:  None. FINDINGS: Limited ultrasound demonstrates small volume ascites, with significant peritoneal soft tissue compatible with metastases. IMPRESSION: Small volume ascites by ultrasound, with evidence of significant peritoneal metastases. Electronically Signed   By: Corrie Mckusick D.O.   On: 11/14/2021 16:47   [place] ASCITES (ABDOMEN LIMITED)  Result Date: 11/03/2021 CLINICAL DATA:  Ascites EXAM: LIMITED ABDOMEN ULTRASOUND FOR ASCITES TECHNIQUE: Limited ultrasound survey for ascites was performed in all four abdominal quadrants. COMPARISON:  None. FINDINGS: Limited sonographic exam of the abdomen was performed. Images demonstrate small volume ascites, primarily in the right upper quadrant, with very little ascites in the lower quadrants for safe image guided paracentesis. No paracentesis performed. IMPRESSION: Small volume ascites.  No paracentesis performed. Electronically Signed   By: Albin Felling M.D.   On: 11/03/2021 15:16   IR  IMAGING GUIDED PORT INSERTION  Result Date: 10/27/2021 CLINICAL DATA:  Peritoneal carcinomatosis and need for porta cath for chemotherapy. EXAM: IMPLANTED PORT A CATH PLACEMENT WITH ULTRASOUND AND FLUOROSCOPIC GUIDANCE ANESTHESIA/SEDATION: Moderate (conscious) sedation was employed during this procedure. A total of Versed 1.0 mg and Fentanyl 50 mcg was administered intravenously by radiology nursing. Moderate Sedation Time: 23 minutes. The patient's level of consciousness and vital signs were monitored continuously by radiology nursing throughout the procedure under my direct supervision. FLUOROSCOPY TIME:  42 seconds.  3.3 mGy. PROCEDURE: The procedure, risks, benefits, and alternatives were explained to the patient. Questions regarding the procedure were encouraged and answered. The patient understands and consents to the procedure. A time-out was performed prior to initiating the procedure. Ultrasound was utilized to confirm patency of the right internal jugular vein. A permanent ultrasound image was recorded. The right neck and chest were prepped with chlorhexidine in a sterile fashion, and a sterile drape was applied covering the operative field. Maximum barrier sterile technique with sterile gowns and gloves were used for the procedure. Local anesthesia was provided with 1% lidocaine. After creating a small venotomy incision, a 21 gauge needle was advanced into the right internal jugular vein under direct, real-time ultrasound guidance. Ultrasound image documentation  was performed. After securing guidewire access, an 8 Fr dilator was placed. A J-wire was kinked to measure appropriate catheter length. A subcutaneous port pocket was then created along the upper chest wall utilizing sharp and blunt dissection. Portable cautery was utilized. The pocket was irrigated with sterile saline. A single lumen power injectable port was chosen for placement. The 8 Fr catheter was tunneled from the port pocket site to the  venotomy incision. The port was placed in the pocket. External catheter was trimmed to appropriate length based on guidewire measurement. At the venotomy, an 8 Fr peel-away sheath was placed over a guidewire. The catheter was then placed through the sheath and the sheath removed. Final catheter positioning was confirmed and documented with a fluoroscopic spot image. The port was accessed with a needle and aspirated and flushed with heparinized saline. The access needle was removed. The venotomy and port pocket incisions were closed with subcutaneous 3-0 Monocryl and subcuticular 4-0 Vicryl. Dermabond was applied to both incisions. COMPLICATIONS: COMPLICATIONS None FINDINGS: After catheter placement, the tip lies at the cavo-atrial junction. The catheter aspirates normally and is ready for immediate use. IMPRESSION: Placement of single lumen port a cath via right internal jugular vein. The catheter tip lies at the cavo-atrial junction. A power injectable port a cath was placed and is ready for immediate use. Electronically Signed   By: Aletta Edouard M.D.   On: 10/27/2021 13:35    Microbiology: Recent Results (from the past 240 hour(s))  Resp Panel by RT-PCR (Flu A&B, Covid) Nasopharyngeal Swab     Status: None   Collection Time: 11/10/21 12:44 PM   Specimen: Nasopharyngeal Swab; Nasopharyngeal(NP) swabs in vial transport medium  Result Value Ref Range Status   SARS Coronavirus 2 by RT PCR NEGATIVE NEGATIVE Final    Comment: (NOTE) SARS-CoV-2 target nucleic acids are NOT DETECTED.  The SARS-CoV-2 RNA is generally detectable in upper respiratory specimens during the acute phase of infection. The lowest concentration of SARS-CoV-2 viral copies this assay can detect is 138 copies/mL. A negative result does not preclude SARS-Cov-2 infection and should not be used as the sole basis for treatment or other patient management decisions. A negative result may occur with  improper specimen  collection/handling, submission of specimen other than nasopharyngeal swab, presence of viral mutation(s) within the areas targeted by this assay, and inadequate number of viral copies(<138 copies/mL). A negative result must be combined with clinical observations, patient history, and epidemiological information. The expected result is Negative.  Fact Sheet for Patients:  EntrepreneurPulse.com.au  Fact Sheet for Healthcare Providers:  IncredibleEmployment.be  This test is no t yet approved or cleared by the [place] FDA and  has been authorized for detection and/or diagnosis of SARS-CoV-2 by FDA under an Emergency Use Authorization (EUA). This EUA will remain  in effect (meaning this test can be used) for the duration of the COVID-19 declaration under Section 564(b)(1) of the Act, 21 U.S.C.section 360bbb-3(b)(1), unless the authorization is terminated  or revoked sooner.       Influenza A by PCR NEGATIVE NEGATIVE Final   Influenza B by PCR NEGATIVE NEGATIVE Final    Comment: (NOTE) The Xpert Xpress SARS-CoV-2/FLU/RSV plus assay is intended as an aid in the diagnosis of influenza from Nasopharyngeal swab specimens and should not be used as a sole basis for treatment. Nasal washings and aspirates are unacceptable for Xpert Xpress SARS-CoV-2/FLU/RSV testing.  Fact Sheet for Patients: EntrepreneurPulse.com.au  Fact Sheet for Healthcare Providers: IncredibleEmployment.be  This test  is not yet approved or cleared by the [place] and has been authorized for detection and/or diagnosis of SARS-CoV-2 by FDA under an Emergency Use Authorization (EUA). This EUA will remain in effect (meaning this test can be used) for the duration of the COVID-19 declaration under Section 564(b)(1) of the Act, 21 U.S.C. section 360bbb-3(b)(1), unless the authorization is terminated or revoked.  Performed at [facility], [address] [place], [place] [address]   Blood culture (routine x 2)     Status: None   Collection Time: 11/10/21  1:57 PM   Specimen: BLOOD  Result Value Ref Range Status   Specimen Description   Final    BLOOD CENTRAL LINE Performed at [facility], [address] [place], [place] [address]    Special Requests   Final    BOTTLES DRAWN AEROBIC AND ANAEROBIC Blood Culture results may not be optimal due to an excessive volume of blood received in culture bottles Performed at [facility], [address] [place], [place] [address]    Culture   Final    NO GROWTH 5 DAYS Performed at [facility], [place] [address] [place], [place] [address]    Report Status 11/15/2021 FINAL  Final  Blood culture (routine x 2)     Status: None   Collection Time: 11/10/21  1:57 PM   Specimen: BLOOD  Result Value Ref Range Status   Specimen Description BLOOD CENTRAL LINE  Final   Special Requests   Final    BOTTLES DRAWN AEROBIC AND ANAEROBIC Blood Culture results may not be optimal due to an excessive volume of blood received in culture bottles   Culture   Final    NO GROWTH 5 DAYS Performed at [facility], [address] [place], [place] [address]    Report Status 11/15/2021 FINAL  Final     Labs: Basic Metabolic Panel: Recent Labs  Lab 11/10/21 1244 11/10/21 1528 11/11/21 0559 11/13/21 0340 11/14/21 0420 11/15/21 0510  NA 137  --  136 134* 131* 132*  K 3.4*  --  3.6 3.9 4.0 3.8  CL 102  --  102 102 100 99  CO2 22  --  27 27 28 26   GLUCOSE 96  --  98 95 105* 116*  BUN 20  --  18 19 20 19   CREATININE 0.32* 0.39* 0.58* 0.60* 0.65 0.54*  CALCIUM 7.9*  --  7.9* 7.9* 7.9* 7.8*   Liver Function Tests: Recent Labs  Lab 11/10/21 1244 11/11/21 0559  AST 19 18  ALT 16 15  ALKPHOS 58 54  BILITOT 0.4 0.4  PROT 5.7* 5.4*  ALBUMIN 2.6* 2.5*   No results for input(s): LIPASE, AMYLASE in the last 168 hours. No results for input(s):  AMMONIA in the last 168 hours. CBC: Recent Labs  Lab 11/10/21 1528 11/11/21 0559 11/13/21 0340 11/14/21 0420 11/15/21 0510  WBC 4.7 4.2 4.6 4.0 3.2*  HGB 9.5* 9.9* 9.7* 8.9* 9.1*  HCT 28.1* 29.6* 29.0* 27.2* 27.3*  MCV 87.0 87.8 85.5 85.5 85.0  PLT 149* 147* 131* 130* 132*   Cardiac Enzymes: No results for input(s): CKTOTAL, CKMB, CKMBINDEX, TROPONINI in the last 168 hours. BNP: BNP (last 3 results) No results for input(s): BNP in the last 8760 hours.  ProBNP (last 3 results) No results for input(s): PROBNP in the last 8760 hours.  CBG: No results for input(s): GLUCAP in the last 168 hours.     Signed:  Irine Seal MD.  [facility] 11/15/2021, 3:23 PM

## 2021-11-16 ENCOUNTER — Encounter: Payer: Self-pay | Admitting: Internal Medicine

## 2021-11-16 DIAGNOSIS — J439 Emphysema, unspecified: Secondary | ICD-10-CM

## 2021-11-16 DIAGNOSIS — R180 Malignant ascites: Secondary | ICD-10-CM

## 2021-11-16 LAB — BASIC METABOLIC PANEL
Anion gap: 7 (ref 5–15)
BUN: 16 mg/dL (ref 8–23)
CO2: 26 mmol/L (ref 22–32)
Calcium: 7.7 mg/dL — ABNORMAL LOW (ref 8.9–10.3)
Chloride: 99 mmol/L (ref 98–111)
Creatinine, Ser: 0.46 mg/dL — ABNORMAL LOW (ref 0.61–1.24)
GFR, Estimated: >60 mL/min (ref >60)
Glucose, Bld: 101 mg/dL — ABNORMAL HIGH (ref 70–99)
Potassium: 3.7 mmol/L (ref 3.5–5.1)
Sodium: 132 mmol/L — ABNORMAL LOW (ref 135–145)

## 2021-11-16 LAB — CBC
HCT: 29.1 % — ABNORMAL LOW (ref 39.0–52.0)
Hemoglobin: 9.7 g/dL — ABNORMAL LOW (ref 13.0–17.0)
MCH: 28 pg (ref 26.0–34.0)
MCHC: 33.3 g/dL (ref 30.0–36.0)
MCV: 84.1 fL (ref 80.0–100.0)
Platelets: 156 10*3/uL (ref 150–400)
RBC: 3.46 MIL/uL — ABNORMAL LOW (ref 4.22–5.81)
RDW: 15.4 % (ref 11.5–15.5)
WBC: 2.3 10*3/uL — ABNORMAL LOW (ref 4.0–10.5)
nRBC: 0 % (ref 0.0–0.2)

## 2021-11-16 MED ORDER — LIDOCAINE-PRILOCAINE 2.5-2.5 % EX CREA
TOPICAL_CREAM | Freq: Once | CUTANEOUS | Status: AC
  Administered 2021-11-16: 1 via TOPICAL
  Filled 2021-11-16: qty 5

## 2021-11-16 MED ORDER — SENNOSIDES-DOCUSATE SODIUM 8.6-50 MG PO TABS
1.0000 | ORAL_TABLET | Freq: Two times a day (BID) | ORAL | Status: DC
  Administered 2021-11-16 – 2021-11-17 (×2): 1 via ORAL
  Filled 2021-11-16 (×3): qty 1

## 2021-11-16 MED ORDER — NICOTINE 21 MG/24HR TD PT24
21.0000 mg | MEDICATED_PATCH | Freq: Every day | TRANSDERMAL | Status: DC
  Administered 2021-11-16 – 2021-11-17 (×2): 21 mg via TRANSDERMAL
  Filled 2021-11-16 (×2): qty 1

## 2021-11-16 NOTE — Progress Notes (Signed)
Physical Therapy Treatment Patient Details Name: Roberto Collins MRN: [id number] DOB: 1951-08-17 Today's Date: 11/16/2021   History of Present Illness Pt is a 71 y/o M admitted on 11/10/21 after presenting with c/c of weakness; likely 2/2 deconditioning, poor appetitie & possible aspiration PNA. 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  Bed mobility with rail but no assist.  Stands with min guard and is able to walk 50' and 60' with RW and min guard progressing to min a x 1.  SOB and fatigue noted but sats 99% after gait.  Long seated rest break between attempts.  Pt with poor judgement on activity tolerance wanting to walk further but cued by writer to turn and then agrees that walking whole loop would have been too much at one attempt.   Recommendations for follow up therapy are one component of a multi-disciplinary discharge planning process, led by the attending physician.  Recommendations may be updated based on patient status, additional functional criteria and insurance authorization.  Follow Up Recommendations  Skilled nursing-short term rehab (<3 hours/day)     Assistance Recommended at Discharge Intermittent Supervision/Assistance  Patient can return home with the following Assist for transportation;Direct supervision/assist for financial management;Assistance with cooking/housework;Help with stairs or ramp for entrance;A little help with walking and/or transfers;A little help with bathing/dressing/bathroom   Equipment Recommendations  Rolling walker (2 wheels);BSC/3in1    Recommendations for Other Services       Precautions / Restrictions Precautions Precautions: Fall Restrictions Weight Bearing Restrictions: No     Mobility  Bed Mobility Overal bed mobility: Modified Independent                  Transfers Overall transfer level: Needs  assistance Equipment used: None Transfers: Sit to/from Stand Sit to Stand: Min guard                Ambulation/Gait Ambulation/Gait assistance: Min assist Gait Distance (Feet): 60 Feet Assistive device: Rolling walker (2 wheels) Gait Pattern/deviations: Decreased step length - right, Decreased step length - left, Decreased stride length Gait velocity: slightly decreased     General Gait Details: 50' x 1, 60' x 1   Stairs             Wheelchair Mobility    Modified Rankin (Stroke Patients Only)       Balance Overall balance assessment: Needs assistance Sitting-balance support: Feet supported Sitting balance-Leahy Scale: Good     Standing balance support: No upper extremity supported, During functional activity Standing balance-Leahy Scale: Fair                              Cognition Arousal/Alertness: Awake/alert Behavior During Therapy: WFL for tasks assessed/performed Overall Cognitive Status: Within Functional Limits for tasks assessed                                          Exercises      General Comments        Pertinent Vitals/Pain Pain Assessment Pain Assessment: No/denies pain    Home Living                          Prior Function  PT Goals (current goals can now be found in the care plan section) Progress towards PT goals: Progressing toward goals    Frequency    Min 2X/week      PT Plan Current plan remains appropriate    Co-evaluation              AM-PAC PT "6 Clicks" Mobility   Outcome Measure  Help needed turning from your back to your side while in a flat bed without using bedrails?: None Help needed moving from lying on your back to sitting on the side of a flat bed without using bedrails?: None Help needed moving to and from a bed to a chair (including a wheelchair)?: A Little Help needed standing up from a chair using your arms (e.g., wheelchair or  bedside chair)?: A Little Help needed to walk in hospital room?: A Little Help needed climbing 3-5 steps with a railing? : A Little 6 Click Score: 20    End of Session Equipment Utilized During Treatment: Gait belt Activity Tolerance: Patient tolerated treatment well Patient left: in bed;with call bell/phone within reach;with bed alarm set;with family/visitor present Nurse Communication: Mobility status PT Visit Diagnosis: Unsteadiness on feet (R26.81);Muscle weakness (generalized) (M62.81)     Time: 9758-8325 PT Time Calculation (min) (ACUTE ONLY): 16 min  Charges:  $Gait Training: 8-22 mins                    Chesley Noon, PTA 11/16/21, 1:55 PM

## 2021-11-16 NOTE — Progress Notes (Signed)
Nutrition Follow-up  DOCUMENTATION CODES:   Underweight, Severe malnutrition in context of chronic illness  INTERVENTION:   -Continue Hormel Shake TID, each supplement provides 520 kcals and 22 grams protein -Continue Boost Breeze po TID, each supplement provides 250 kcal and 9 grams of protein  -Continue MVI with minerals daily  NUTRITION DIAGNOSIS:   Severe Malnutrition related to chronic illness (stage IV rectal cancer with peritoneal cancer) as evidenced by severe fat depletion, severe muscle depletion.  Ongoing  GOAL:   Patient will meet greater than or equal to 90% of their needs  Progressing   MONITOR:   PO intake, Supplement acceptance, Labs, Weight trends, Skin, I & O's  REASON FOR ASSESSMENT:   Malnutrition Screening Tool, Consult Assessment of nutrition requirement/status  ASSESSMENT:   71 y.o. male with history of stage 4 rectal cancer with peritoneal carcinomatosis on chemo (last: 11/01/21), anemia, and asthma. He presented to the ED due to feeling increasingly weak for several days, poor appetite, mild nausea. Admitted with working dx of weakness.  Reviewed I/O's: -350 ml x 24 hours and +60 ml x 24 hours  UOP: 350 ml x 24 hours  Pt in with MD at time of visit.   Pt with stable oral intake. Noted meal completions 25-50%. Pt is taking most of his Boost Breeze supplements.   Per MD notes, pt is medically stable for discharge and awaiting SNF placement. Previously accepting SNF denied pt secondary to bedbug precautions.   Medications reviewed and include remeron.  Labs reviewed: Na: 132.   Diet Order:   Diet Order             Diet general           DIET DYS 2 Room service appropriate? Yes; Fluid consistency: Thin  Diet effective now                   EDUCATION NEEDS:   No education needs have been identified at this time  Skin:  Skin Assessment: Reviewed RN Assessment  Last BM:  11/16/21  Height:   Ht Readings from Last 1 Encounters:   11/10/21 5\' 10"  (1.778 m)    Weight:   Wt Readings from Last 1 Encounters:  11/10/21 55.3 kg    Ideal Body Weight:  75.45 kg  BMI:  Body mass index is 17.51 kg/m.  Estimated Nutritional Needs:   Kcal:  2200-2400  Protein:  120-135 grams  Fluid:  > 2 L    Loistine Chance, RD, LDN, [place] Registered Dietitian II Certified Diabetes Care and Education Specialist Please refer to [facility] for RD and/or RD on-call/weekend/after hours pager

## 2021-11-16 NOTE — Progress Notes (Signed)
Occupational Therapy Treatment Patient Details Name: Roberto Collins MRN: [id number] DOB: [birth year] Today's Date: 11/16/2021   History of present illness Pt is a 71 y/o M admitted on 11/10/21 after presenting with c/c of weakness; likely 2/2 deconditioning, poor appetitie & possible aspiration PNA. 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 He requests to get back to bed and his sister is present in the room reporting that pt's call button is malfunctioning. Pt able to CTS from chair with RW w/ CGA and demos F standing balance. He requires MAX A For thorough completion of posterior peri care in standing d/t BM smear in chair. He is able to take ~3-4 small shuffling steps back to bed with CGA and RW. He is able to get back to bed with HOB elevated and SUPV to elevated B LE back to bed. He requires MOD A for doffing socks. He and sister are educated on hospital phone use as an alternative to use of call button until it can be fixed and Network engineer as well as Therapist, sports are notified of call button issues. Will continue to follow acutely. Continue to recommend f/u OT services in STR setting.    Recommendations for follow up therapy are one component of a multi-disciplinary discharge planning process, led by the attending physician.  Recommendations may be updated based on patient status, additional functional criteria and insurance authorization.    Follow Up Recommendations  Skilled nursing-short term rehab (<3 hours/day)    Assistance Recommended at Discharge Intermittent Supervision/Assistance  Patient can return home with the following  A little help with walking and/or transfers;A little help with bathing/dressing/bathroom;Assistance with cooking/housework;Assist for transportation;Help with stairs or ramp for entrance   Equipment Recommendations  None recommended by OT     Recommendations for Other Services      Precautions / Restrictions Precautions Precautions: Fall Restrictions Weight Bearing Restrictions: No       Mobility Bed Mobility Overal bed mobility: Needs Assistance Bed Mobility: Sit to Supine       Sit to supine: Supervision, HOB elevated   General bed mobility comments: increased time to manage LEs.    Transfers Overall transfer level: Needs assistance Equipment used: Rolling walker (2 wheels) Transfers: Sit to/from Stand Sit to Stand: Min guard           General transfer comment: increased time     Balance Overall balance assessment: Needs assistance Sitting-balance support: Feet supported Sitting balance-Leahy Scale: Good     Standing balance support: During functional activity, Bilateral upper extremity supported Standing balance-Leahy Scale: Fair Standing balance comment: benefits from UE support on walker to weight shift, can seemingly static stand w/o UE support                           ADL either performed or assessed with clinical judgement   ADL Overall ADL's : Needs assistance/impaired                     Lower Body Dressing: Moderate assistance Lower Body Dressing Details (indicate cue type and reason): doff socks bed level     Toileting- Clothing Manipulation and Hygiene: Maximal assistance;Sit to/from stand Toileting - Clothing Manipulation Details (indicate cue type and reason): able to CTS with RW with CGA, but requires MAX A for thorough posterior peri care as  his bottom is noted to be slightly soiled upon coming to standing position.     Functional mobility during ADLs: Min guard;Rolling walker (2 wheels) (to take 3-4 small shuffling steps from chair to bed)      Extremity/Trunk Assessment Upper Extremity Assessment Upper Extremity Assessment: Generalized weakness   Lower Extremity Assessment Lower Extremity Assessment: Generalized weakness        Vision        Perception     Praxis      Cognition Arousal/Alertness: Awake/alert Behavior During Therapy: WFL for tasks assessed/performed Overall Cognitive Status: Within Functional Limits for tasks assessed                                 General Comments: poor hygiene awareness, but appropriate with command following.        Exercises      Shoulder Instructions       General Comments      Pertinent Vitals/ Pain       Pain Assessment Pain Assessment: No/denies pain  Home Living                                          Prior Functioning/Environment              Frequency  Min 2X/week        Progress Toward Goals  OT Goals(current goals can now be found in the care plan section)  Progress towards OT goals: Progressing toward goals  Acute Rehab OT Goals Patient Stated Goal: to get better OT Goal Formulation: With patient/family Time For Goal Achievement: 11/25/21 Potential to Achieve Goals: Good  Plan Discharge plan needs to be updated;Frequency remains appropriate    Co-evaluation                 AM-PAC OT "6 Clicks" Daily Activity     Outcome Measure   Help from another person eating meals?: None Help from another person taking care of personal grooming?: A Little Help from another person toileting, which includes using toliet, bedpan, or urinal?: A Little Help from another person bathing (including washing, rinsing, drying)?: A Little Help from another person to put on and taking off regular upper body clothing?: A Little Help from another person to put on and taking off regular lower body clothing?: A Little 6 Click Score: 19    End of Session Equipment Utilized During Treatment: Rolling walker (2 wheels)  OT Visit Diagnosis: Other abnormalities of gait and mobility (R26.89);Muscle weakness (generalized) (M62.81)   Activity Tolerance Patient tolerated treatment well   Patient Left in bed;with call bell/phone  within reach;with bed alarm set;with family/visitor present   Nurse Communication Mobility status;Other (comment) (call button malfunctioning)        Time: 2694-8546 OT Time Calculation (min): 25 min  Charges: OT General Charges $OT Visit: 1 Visit OT Treatments $Self Care/Home Management : 8-22 mins $Therapeutic Activity: 8-22 mins  Gerrianne Scale, MS, OTR/L ascom [phone] 11/16/21, 3:57 PM

## 2021-11-16 NOTE — Progress Notes (Signed)
PROGRESS NOTE    Roberto Collins  FYB:[id number] DOB: 1950/12/11 DOA: 11/10/2021 PCP: [name], PA-C    Chief Complaint  Patient presents with   Weakness    Brief Narrative:  HPI per Roberto Collins is a 71 y.o. 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  He denies some productive cough denies any shortness of breath or chest pain.   ED Course: In the ER patient appears nonfocal appears generally weak dehydrated and malnourished.  Abdomen appears benign on exam.  Labs show mild hypokalemia with potassium 3.4 high sensitive troponins were negative lactic acid normal hemoglobin around baseline chest x-ray showing possible infiltrates and mild pleural effusion.  Albumin is 2.6.  COVID test negative.  EKG shows normal sinus rhythm.  Patient admitted for generalized weakness likely from deconditioning poor appetite and possible aspiration pneumonia.   Assessment & Plan:   Principal Problem:   Weakness Active Problems:   Pulmonary emphysema (HCC)   Peritoneal carcinomatosis (HCC)   Pneumonia   Palliative care encounter   Protein-calorie malnutrition, severe   Chronic obstructive pulmonary disease ([place])  #1 probable aspiration pneumonia -Patient admitted with generalized weakness, poor oral intake.  Work-up done on presentation seem nonfocal.  Labs showed a hypokalemia with potassium of 3.4.  Chest x-ray concerning for possible infiltrates and mild pleural effusion.  COVID-19 PCR negative. -Patient admitted placed on IV antibiotics and completed a 5-day course of IV Levaquin. -Patient remained afebrile. -Patient currently stable and awaiting discharge to SNF.  2.  Generalized weakness -Patient seen by PT/OT who recommended SNF  placement.  3.  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 -Patient was scheduled for chemotherapy which was held. -Patient seen in consultation by oncology who assessed and followed the patient during the hospitalization and patient noted to have overall poor prognosis. -Oncology discussed with patient that patient will be reevaluated in the outpatient setting to see if he is able to restart chemotherapy and if patient's condition deteriorates chemotherapy will be discontinued and patient would not be eligible for any further chemotherapy and hospice will be recommended at that time. -Patient to be discharged to skilled nursing facility with hopes of some clinical improvement with palliative care following in the outpatient setting. -Patient will follow-up with oncology 2 weeks post discharge.  4.  Hyperlipidemia -It was noted that patient not taking statin.  5.  Bedbugs -Status post permethrin and patient was placed on contact precautions. -If needed permethrin may be reordered 2 weeks from initial dose. -No further bedbugs noted by staff. -Contact precautions have been discontinued.  6.  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 -Continue nutritional supplementation.  7.  Normocytic anemia/pancytopenia -Fall likely secondary to recent chemotherapy. -Hemoglobin stable at 9.7. -Outpatient follow-up with hematology/oncology.  8. COPD -Stable -Continue bronchodilators, flutter valve, incentive spirometry. -Place on nicotine patch.  9.  Abdominal distention likely secondary to malignant ascites/peritoneal carcinomatosis -Patient with some abdominal distention however not tight, nontender to palpation -Abdominal ultrasound on 11/14/2021 with small volume ascites by ultrasound with evidence of significant  peritoneal metastases. -Supportive care. -Per oncology.      DVT prophylaxis: Lovenox  Code Status: DNR Family Communication: Updated patient.  No family at bedside. Disposition:   Status is: Inpatient  Remains inpatient appropriate because: Unsafe disposition     Consultants:  Oncology: Dr. Rogue Bussing 11/13/2021    Procedures:  -Abdominal ultrasound 11/14/2021 Lower extremity Dopplers 11/10/2021 Plain films of the abdomen 11/10/2021 Chest x-ray 11/10/2021 11/10/2021  Antimicrobials:  -Permethrin 11/11/2021 x1 dose -IV Levaquin 11/10/2021>>>>> 11/14/2021   Subjective: Sitting up in chair.  No chest pain.  No shortness of breath.  Denies any significant abdominal pain.  Feels abdomen slightly distended.  Tolerating current diet. Patient was to have been discharged yesterday 11/15/2021 however per social worker noted to have been refused by SNF.  Objective: Vitals:   11/15/21 1618 11/15/21 2038 11/16/21 0415 11/16/21 0833  BP: 122/83 128/78 102/73 110/77  Pulse: 96 (!) 103 (!) 101 90  Resp: 16 18 16 14   Temp: 98.9 F (37.2 C) 98.9 F (37.2 C) 99.2 F (37.3 C) 98.9 F (37.2 C)  TempSrc: Oral   Oral  SpO2: 99% 97% 95% 96%  Weight:      Height:        Intake/Output Summary (Last 24 hours) at 11/16/2021 1048 Last data filed at 11/15/2021 1810 Gross per 24 hour  Intake --  Output 350 ml  Net -350 ml   Filed Weights   11/10/21 1118  Weight: 55.3 kg    Examination:  General exam: Appears calm and comfortable  Respiratory system: Clear to auscultation. Respiratory effort normal. Cardiovascular system: S1 & S2 heard, RRR. No JVD, murmurs, rubs, gallops or clicks. No pedal edema. Gastrointestinal system: Abdomen is mildly distended, nontender, positive bowel sounds.  No rebound.  No guarding.  Central nervous system: Alert and oriented. No focal neurological deficits. Extremities: Symmetric 5 x 5 power. Skin: No rashes, lesions or ulcers Psychiatry: Judgement and  insight appear normal. Mood & affect appropriate.     Data Reviewed: I have personally reviewed following labs and imaging studies  CBC: Recent Labs  Lab 11/11/21 0559 11/13/21 0340 11/14/21 0420 11/15/21 0510 11/16/21 0825  WBC 4.2 4.6 4.0 3.2* 2.3*  HGB 9.9* 9.7* 8.9* 9.1* 9.7*  HCT 29.6* 29.0* 27.2* 27.3* 29.1*  MCV 87.8 85.5 85.5 85.0 84.1  PLT 147* 131* 130* 132* 151    Basic Metabolic Panel: Recent Labs  Lab 11/11/21 0559 11/13/21 0340 11/14/21 0420 11/15/21 0510 11/16/21 0825  NA 136 134* 131* 132* 132*  K 3.6 3.9 4.0 3.8 3.7  CL 102 102 100 99 99  CO2 27 27 28 26 26   GLUCOSE 98 95 105* 116* 101*  BUN 18 19 20 19 16   CREATININE 0.58* 0.60* 0.65 0.54* 0.46*  CALCIUM 7.9* 7.9* 7.9* 7.8* 7.7*    GFR: Estimated Creatinine Clearance: 68.2 mL/min (A) (by C-G formula based on SCr of 0.46 mg/dL (L)).  Liver Function Tests: Recent Labs  Lab 11/10/21 1244 11/11/21 0559  AST 19 18  ALT 16 15  ALKPHOS 58 54  BILITOT 0.4 0.4  PROT 5.7* 5.4*  ALBUMIN 2.6* 2.5*    CBG: No results for input(s): GLUCAP in the last 168 hours.   Recent Results (from the past 240 hour(s))  Resp Panel by RT-PCR (Flu A&B, Covid) Nasopharyngeal Swab     Status: None   Collection Time: 11/10/21 12:44 PM   Specimen: Nasopharyngeal Swab; Nasopharyngeal(NP) swabs in vial transport medium  Result Value Ref Range Status   SARS Coronavirus 2 by RT PCR NEGATIVE NEGATIVE Final    Comment: (  NOTE) SARS-CoV-2 target nucleic acids are NOT DETECTED.  The SARS-CoV-2 RNA is generally detectable in upper respiratory specimens during the acute phase of infection. The lowest concentration of SARS-CoV-2 viral copies this assay can detect is 138 copies/mL. A negative result does not preclude SARS-Cov-2 infection and should not be used as the sole basis for treatment or other patient management decisions. A negative result may occur with  improper specimen collection/handling, submission of  specimen other than nasopharyngeal swab, presence of viral mutation(s) within the areas targeted by this assay, and inadequate number of viral copies(<138 copies/mL). A negative result must be combined with clinical observations, patient history, and epidemiological information. The expected result is Negative.  Fact Sheet for Patients:  EntrepreneurPulse.com.au  Fact Sheet for Healthcare Providers:  IncredibleEmployment.be  This test is no t yet approved or cleared by the [place] FDA and  has been authorized for detection and/or diagnosis of SARS-CoV-2 by FDA under an Emergency Use Authorization (EUA). This EUA will remain  in effect (meaning this test can be used) for the duration of the COVID-19 declaration under Section 564(b)(1) of the Act, 21 U.S.C.section 360bbb-3(b)(1), unless the authorization is terminated  or revoked sooner.       Influenza A by PCR NEGATIVE NEGATIVE Final   Influenza B by PCR NEGATIVE NEGATIVE Final    Comment: (NOTE) The Xpert Xpress SARS-CoV-2/FLU/RSV plus assay is intended as an aid in the diagnosis of influenza from Nasopharyngeal swab specimens and should not be used as a sole basis for treatment. Nasal washings and aspirates are unacceptable for Xpert Xpress SARS-CoV-2/FLU/RSV testing.  Fact Sheet for Patients: EntrepreneurPulse.com.au  Fact Sheet for Healthcare Providers: IncredibleEmployment.be  This test is not yet approved or cleared by the [place] FDA and has been authorized for detection and/or diagnosis of SARS-CoV-2 by FDA under an Emergency Use Authorization (EUA). This EUA will remain in effect (meaning this test can be used) for the duration of the COVID-19 declaration under Section 564(b)(1) of the Act, 21 U.S.C. section 360bbb-3(b)(1), unless the authorization is terminated or revoked.  Performed at [facility], [address] [place], [place] [address]   Blood culture (routine x 2)     Status: None   Collection Time: 11/10/21  1:57 PM   Specimen: BLOOD  Result Value Ref Range Status   Specimen Description   Final    BLOOD CENTRAL LINE Performed at [facility], [address], [place], [place] [address]    Special Requests   Final    BOTTLES DRAWN AEROBIC AND ANAEROBIC Blood Culture results may not be optimal due to an excessive volume of blood received in culture bottles Performed at [facility], [address] [place], [place] [address]    Culture   Final    NO GROWTH 5 DAYS Performed at [facility], [place] [place], [place] [address]    Report Status 11/15/2021 FINAL  Final  Blood culture (routine x 2)     Status: None   Collection Time: 11/10/21  1:57 PM   Specimen: BLOOD  Result Value Ref Range Status   Specimen Description BLOOD CENTRAL LINE  Final   Special Requests   Final    BOTTLES DRAWN AEROBIC AND ANAEROBIC Blood Culture results may not be optimal due to an excessive volume of blood received in culture bottles   Culture   Final    NO GROWTH 5 DAYS Performed at [facility], [address] [place], [place] [address]    Report Status 11/15/2021 FINAL  Final  Resp Panel by RT-PCR (Flu A&B, Covid) Nasopharyngeal Swab     Status: None   Collection Time: 11/15/21  3:02 PM   Specimen: Nasopharyngeal Swab; Nasopharyngeal(NP) swabs in vial transport medium  Result Value Ref Range Status   SARS Coronavirus 2 by RT PCR NEGATIVE NEGATIVE Final    Comment: (NOTE) SARS-CoV-2 target nucleic acids are NOT DETECTED.  The SARS-CoV-2 RNA is generally detectable in upper respiratory specimens during the acute phase of infection. The lowest concentration of SARS-CoV-2 viral copies this assay can detect is 138 copies/mL. A negative result does not preclude SARS-Cov-2 infection and should not be used as the sole basis for treatment or other patient  management decisions. A negative result may occur with  improper specimen collection/handling, submission of specimen other than nasopharyngeal swab, presence of viral mutation(s) within the areas targeted by this assay, and inadequate number of viral copies(<138 copies/mL). A negative result must be combined with clinical observations, patient history, and epidemiological information. The expected result is Negative.  Fact Sheet for Patients:  EntrepreneurPulse.com.au  Fact Sheet for Healthcare Providers:  IncredibleEmployment.be  This test is no t yet approved or cleared by the [place] FDA and  has been authorized for detection and/or diagnosis of SARS-CoV-2 by FDA under an Emergency Use Authorization (EUA). This EUA will remain  in effect (meaning this test can be used) for the duration of the COVID-19 declaration under Section 564(b)(1) of the Act, 21 U.S.C.section 360bbb-3(b)(1), unless the authorization is terminated  or revoked sooner.       Influenza A by PCR NEGATIVE NEGATIVE Final   Influenza B by PCR NEGATIVE NEGATIVE Final    Comment: (NOTE) The Xpert Xpress SARS-CoV-2/FLU/RSV plus assay is intended as an aid in the diagnosis of influenza from Nasopharyngeal swab specimens and should not be used as a sole basis for treatment. Nasal washings and aspirates are unacceptable for Xpert Xpress SARS-CoV-2/FLU/RSV testing.  Fact Sheet for Patients: EntrepreneurPulse.com.au  Fact Sheet for Healthcare Providers: IncredibleEmployment.be  This test is not yet approved or cleared by the [place] FDA and has been authorized for detection and/or diagnosis of SARS-CoV-2 by FDA under an Emergency Use Authorization (EUA). This EUA will remain in effect (meaning this test can be used) for the duration of the COVID-19 declaration under Section 564(b)(1) of the Act, 21 U.S.C. section 360bbb-3(b)(1),  unless the authorization is terminated or revoked.  Performed at [facility], [address] [place], [place] [address]          Radiology Studies: [place] ASCITES (ABDOMEN LIMITED)  Result Date: 11/14/2021 CLINICAL DATA:  71 year old male referred for possible paracentesis with known malignancy. EXAM: LIMITED ABDOMEN ULTRASOUND FOR ASCITES TECHNIQUE: Limited ultrasound survey for ascites was performed in all four abdominal quadrants. COMPARISON:  None. FINDINGS: Limited ultrasound demonstrates small volume ascites, with significant peritoneal soft tissue compatible with metastases. IMPRESSION: Small volume ascites by ultrasound, with evidence of significant peritoneal metastases. Electronically Signed   By: Corrie Mckusick D.O.   On: 11/14/2021 16:47        Scheduled Meds:  Chlorhexidine Gluconate Cloth  6 each Topical Daily   cyclobenzaprine  10 mg Oral QHS   doxepin  25 mg Oral QHS   enoxaparin (LOVENOX) injection  40 mg Subcutaneous Q24H   feeding supplement  1 Container Oral TID BM   gabapentin  1,800 mg Oral QHS   guaiFENesin  1,200 mg  Oral BID   lidocaine  1 patch Transdermal Q24H   loratadine  10 mg Oral Daily   morphine  60 mg Oral Q12H   multivitamin with minerals  1 tablet Oral Daily   pantoprazole  40 mg Oral Daily   senna-docusate  1 tablet Oral BID   Continuous Infusions:  albumin human       LOS: 5 days    Time spent: 35 minutes    Irine Seal, MD [facility]   To contact the attending provider between 7A-7P or the covering provider during after hours 7P-7A, please log into the web site [email or web address] and access using universal [place] password for that web site. If you do not have the password, please call the hospital operator.  11/16/2021, 10:48 AM

## 2021-11-16 NOTE — TOC Progression Note (Addendum)
Transition of Care [facility]) - Progression Note    Patient Details  Name: Roberto Collins MRN: [id number] Date of Birth: 04/22/1951  [facility] CM/SW Contact  Eileen Stanford, LCSW Phone Number: 11/16/2021, 9:07 AM  Clinical Narrative:  Precautions have been removed, there is no evidence of bed bugs and pt has been treated.  [facility] is now stating they can not take pt "it would not be a good idea, sometimes it takes more than one treatment." CSW will f/o to other facilities at this time. RN and pt's Sister updated.  CSW has faxed pt out further including [place] and [place]. No bed offers at this time.   Expected Discharge Plan: [place] Barriers to Discharge: No Barriers Identified  Expected Discharge Plan and Services Expected Discharge Plan: [place] In-house Referral: Clinical Social Work   Post Acute Care Choice: [place] Living arrangements for the past 2 months: Single Family Home Expected Discharge Date: 11/15/21                                     Social Determinants of Health (SDOH) Interventions    Readmission Risk Interventions No flowsheet data found.

## 2021-11-17 ENCOUNTER — Inpatient Hospital Stay: Payer: Medicare PPO

## 2021-11-17 LAB — CREATININE, SERUM
Creatinine, Ser: 0.59 mg/dL — ABNORMAL LOW (ref 0.61–1.24)
GFR, Estimated: >60 mL/min (ref >60)

## 2021-11-17 MED ORDER — METHYLPREDNISOLONE SODIUM SUCC 125 MG IJ SOLR
60.0000 mg | Freq: Two times a day (BID) | INTRAMUSCULAR | Status: DC
  Administered 2021-11-17: 60 mg via INTRAVENOUS
  Filled 2021-11-17: qty 2

## 2021-11-17 MED ORDER — IPRATROPIUM-ALBUTEROL 0.5-2.5 (3) MG/3ML IN SOLN: 3.0000 mL | Freq: Three times a day (TID) | RESPIRATORY_TRACT | Status: AC

## 2021-11-17 MED ORDER — PREDNISONE 20 MG PO TABS: 40.0000 mg | ORAL_TABLET | Freq: Every day | ORAL | 0 refills | Status: AC

## 2021-11-17 MED ORDER — IPRATROPIUM-ALBUTEROL 0.5-2.5 (3) MG/3ML IN SOLN
3.0000 mL | Freq: Three times a day (TID) | RESPIRATORY_TRACT | Status: DC
  Administered 2021-11-17: 3 mL via RESPIRATORY_TRACT

## 2021-11-17 MED ORDER — MOMETASONE FURO-FORMOTEROL FUM 200-5 MCG/ACT IN AERO
2.0000 | INHALATION_SPRAY | Freq: Two times a day (BID) | RESPIRATORY_TRACT | Status: DC
  Administered 2021-11-17: 2 via RESPIRATORY_TRACT
  Filled 2021-11-17: qty 8.8

## 2021-11-17 MED ORDER — SPIRIVA HANDIHALER 18 MCG IN CAPS: 18.0000 ug | ORAL_CAPSULE | Freq: Every day | RESPIRATORY_TRACT | 0 refills | Status: AC

## 2021-11-17 MED ORDER — SENNOSIDES-DOCUSATE SODIUM 8.6-50 MG PO TABS: 1.0000 | ORAL_TABLET | Freq: Two times a day (BID) | ORAL | Status: AC

## 2021-11-17 MED ORDER — NICOTINE 21 MG/24HR TD PT24: 21.0000 mg | MEDICATED_PATCH | Freq: Every day | TRANSDERMAL | 0 refills | Status: AC

## 2021-11-17 NOTE — TOC Progression Note (Addendum)
Transition of Care [facility]) - Progression Note    Patient Details  Name: Roberto Collins MRN: [id number] Date of Birth: [birth year]  Transition of Care [facility]) CM/SW Contact  Eileen Stanford, LCSW Phone Number: 11/17/2021, 9:20 AM  Clinical Narrative:   CSW provided pt's sister with bed offers. Pt's sister is going to review the facilities via google and respond in a quick manner regarding choice. At that time CSW will start auth in attempt to get pt to SNF today. MD updated.  Pt's sister has agreed to [facility]. We have received insurance auth. Waiting to see if pt will dc today pending ultrasound.    Expected Discharge Plan: [place] Barriers to Discharge: No Barriers Identified  Expected Discharge Plan and Services Expected Discharge Plan: [place] In-house Referral: Clinical Social Work   Post Acute Care Choice: [place] Living arrangements for the past 2 months: Single Family Home Expected Discharge Date: 11/15/21                                     Social Determinants of Health (SDOH) Interventions    Readmission Risk Interventions No flowsheet data found.

## 2021-11-17 NOTE — Progress Notes (Signed)
Attempted to call report. No answer. Will attempt again.

## 2021-11-17 NOTE — TOC Transition Note (Signed)
Transition of Care [facility]) - CM/SW Discharge Note   Patient Details  Name: [name] MRN: [id number] Date of Birth: 1951/02/20  Transition of Care [facility]) CM/SW Contact:  Eileen Stanford, LCSW Phone Number: 11/17/2021, 2:31 PM   Clinical Narrative:   Clinical Social Worker facilitated patient discharge including contacting patient family and facility to confirm patient discharge plans.  Clinical information faxed to facility and family agreeable with plan.  CSW arranged ambulance transport via [facility] to [facility] room 106 .  RN to call [phone] for report prior to discharge.     Final next level of care: Skilled Nursing Facility Barriers to Discharge: No Barriers Identified   Patient Goals and CMS Choice Patient states their goals for this hospitalization and ongoing recovery are:: to go to rehab CMS Medicare.gov Compare Post Acute Care list provided to:: Patient Represenative (must comment) (sister) Choice offered to / list presented to : Sibling  Discharge Placement              Patient chooses bed at:  [facility]) Patient to be transferred to facility by: [facility] Name of family member notified: sister Patient and family notified of of transfer: 11/17/21  Discharge Plan and Services In-house Referral: Clinical Social Work   Post Acute Care Choice: [place]                               Social Determinants of Health ([place]) Interventions     Readmission Risk Interventions No flowsheet data found.

## 2021-11-17 NOTE — Care Management Important Message (Signed)
Important Message  Patient Details  Name: Roberto Collins MRN: [id number] Date of Birth: [birth year]   Medicare Important Message Given:  Yes     Dannette Barbara 11/17/2021, 11:17 AM

## 2021-11-17 NOTE — Progress Notes (Signed)
Report called to Johnnette Gourd, Therapist, sports at [facility]. Port-a-cath deaccessed prior to transport. Discharge information and printed prescriptions sent with pt. EMS here to transport pt at this time.

## 2021-11-17 NOTE — Progress Notes (Signed)
PROGRESS NOTE    Roberto Collins  ENI:[id number] DOB: June 26, 1951 DOA: 11/10/2021 PCP: [name], PA-C    Chief Complaint  Patient presents with   Weakness    Brief Narrative:  HPI per Dr. Debbora Lacrosse Collins is a 71 y.o. 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  He denies some productive cough denies any shortness of breath or chest pain.   ED Course: In the ER patient appears nonfocal appears generally weak dehydrated and malnourished.  Abdomen appears benign on exam.  Labs show mild hypokalemia with potassium 3.4 high sensitive troponins were negative lactic acid normal hemoglobin around baseline chest x-ray showing possible infiltrates and mild pleural effusion.  Albumin is 2.6.  COVID test negative.  EKG shows normal sinus rhythm.  Patient admitted for generalized weakness likely from deconditioning poor appetite and possible aspiration pneumonia.   Assessment & Plan:   Principal Problem:   Weakness Active Problems:   Pulmonary emphysema (HCC)   Peritoneal carcinomatosis (HCC)   Pneumonia   Palliative care encounter   Protein-calorie malnutrition, severe   Chronic obstructive pulmonary disease (HCC)   Ascites, malignant  #1 probable aspiration pneumonia -Patient admitted with generalized weakness, poor oral intake.  Work-up done on presentation seem nonfocal.  Labs showed a hypokalemia with potassium of 3.4.  Chest x-ray concerning for possible infiltrates and mild pleural effusion.  COVID-19 PCR negative. -Patient admitted placed on IV antibiotics and completed a 5-day course of IV Levaquin. -Patient remained afebrile. -Patient currently stable and awaiting discharge to SNF.  2.  Generalized weakness -Patient seen by PT/OT who  recommended SNF placement.  3.  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 -Patient was scheduled for chemotherapy which was held. -Patient seen in consultation by oncology who assessed and followed the patient during the hospitalization and patient noted to have overall poor prognosis. -Oncology discussed with patient that patient will be reevaluated in the outpatient setting to see if he is able to restart chemotherapy and if patient's condition deteriorates chemotherapy will be discontinued and patient would not be eligible for any further chemotherapy and hospice will be recommended at that time. -Patient to be discharged to skilled nursing facility with hopes of some clinical improvement with palliative care following in the outpatient setting. -Patient will follow-up with oncology 2 weeks post discharge.  4.  Hyperlipidemia -It was noted that patient not taking statin.  5.  Bedbugs -Status post permethrin and patient was placed on contact precautions. -If needed permethrin may be reordered 2 weeks from initial dose. -No further bedbugs noted by staff. -Contact precautions have been discontinued.  6.  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 -Continue nutritional supplementation.  7.  Normocytic anemia/pancytopenia -Fall likely secondary to recent chemotherapy. -Hemoglobin stable at 9.7. -Outpatient follow-up with hematology/oncology.  8. COPD -Patient with minimal wheezing on examination today.   -Patient placed on scheduled DuoNebs, received a dose of IV Solu-Medrol and will be discharged on prednisone 40 mg daily x5 days.   -Nicotine patch placed.   9.  Abdominal distention likely secondary to malignant ascites/peritoneal carcinomatosis -Patient with some abdominal distention however not  tight, nontender  to palpation -Abdominal ultrasound on 11/14/2021 with small volume ascites by ultrasound with evidence of significant peritoneal metastases. -Repeat abdominal ultrasound today with minimal ascites. -Supportive care. -Per oncology.      DVT prophylaxis: Lovenox Code Status: DNR Family Communication: Updated patient.  No family at bedside. Disposition:   Status is: Inpatient  Remains inpatient appropriate because: Unsafe disposition     Consultants:  Oncology: Dr. Rogue Bussing 11/13/2021    Procedures:  -Abdominal ultrasound 11/14/2021, 11/17/2021 Lower extremity Dopplers 11/10/2021 Plain films of the abdomen 11/10/2021 Chest x-ray 11/10/2021 11/10/2021  Antimicrobials:  -Permethrin 11/11/2021 x1 dose -IV Levaquin 11/10/2021>>>>> 11/14/2021   Subjective: Getting cleaned up. Per RN patient with c/o SOB. Patient states abd distented and tight like a balloon.   Objective: Vitals:   11/16/21 1658 11/16/21 2103 11/17/21 0454 11/17/21 0739  BP: 107/72 121/72 115/78 116/75  Pulse: 92 94 94 93  Resp: 18 18 16 17   Temp: 99.3 F (37.4 C) 98.5 F (36.9 C) 98.9 F (37.2 C) 99 F (37.2 C)  TempSrc: Oral Oral Oral Oral  SpO2: 97% 98% 96% 97%  Weight:      Height:        Intake/Output Summary (Last 24 hours) at 11/17/2021 1034 Last data filed at 11/17/2021 1031 Gross per 24 hour  Intake 480 ml  Output 300 ml  Net 180 ml    Filed Weights   11/10/21 1118  Weight: 55.3 kg    Examination:  General exam: Appears calm and comfortable  Respiratory system: Minimal expiratory wheezing.  No rhonchi.  Fair air movement.  Speaking in full sentences. Cardiovascular system: Regular rate rhythm no murmurs rubs or gallops.  No JVD.  No lower extremity edema.   Gastrointestinal system: Abdomen is mildly distended, nontender, positive bowel sounds.  No rebound.  No guarding.  Central nervous system: Alert and oriented. No focal neurological deficits. Extremities: Symmetric  5 x 5 power. Skin: No rashes, lesions or ulcers Psychiatry: Judgement and insight appear normal. Mood & affect appropriate.     Data Reviewed: I have personally reviewed following labs and imaging studies  CBC: Recent Labs  Lab 11/11/21 0559 11/13/21 0340 11/14/21 0420 11/15/21 0510 11/16/21 0825  WBC 4.2 4.6 4.0 3.2* 2.3*  HGB 9.9* 9.7* 8.9* 9.1* 9.7*  HCT 29.6* 29.0* 27.2* 27.3* 29.1*  MCV 87.8 85.5 85.5 85.0 84.1  PLT 147* 131* 130* 132* 156     Basic Metabolic Panel: Recent Labs  Lab 11/11/21 0559 11/13/21 0340 11/14/21 0420 11/15/21 0510 11/16/21 0825 11/17/21 0457  NA 136 134* 131* 132* 132*  --   K 3.6 3.9 4.0 3.8 3.7  --   CL 102 102 100 99 99  --   CO2 27 27 28 26 26   --   GLUCOSE 98 95 105* 116* 101*  --   BUN 18 19 20 19 16   --   CREATININE 0.58* 0.60* 0.65 0.54* 0.46* 0.59*  CALCIUM 7.9* 7.9* 7.9* 7.8* 7.7*  --      GFR: Estimated Creatinine Clearance: 68.2 mL/min (A) (by C-G formula based on SCr of 0.59 mg/dL (L)).  Liver Function Tests: Recent Labs  Lab 11/10/21 1244 11/11/21 0559  AST 19 18  ALT 16 15  ALKPHOS 58 54  BILITOT 0.4 0.4  PROT 5.7* 5.4*  ALBUMIN 2.6* 2.5*     CBG: No results for input(s): GLUCAP in the last 168 hours.   Recent Results (from the past 240 hour(s))  Resp Panel by RT-PCR (Flu  A&B, Covid) Nasopharyngeal Swab     Status: None   Collection Time: 11/10/21 12:44 PM   Specimen: Nasopharyngeal Swab; Nasopharyngeal(NP) swabs in vial transport medium  Result Value Ref Range Status   SARS Coronavirus 2 by RT PCR NEGATIVE NEGATIVE Final    Comment: (NOTE) SARS-CoV-2 target nucleic acids are NOT DETECTED.  The SARS-CoV-2 RNA is generally detectable in upper respiratory specimens during the acute phase of infection. The lowest concentration of SARS-CoV-2 viral copies this assay can detect is 138 copies/mL. A negative result does not preclude SARS-Cov-2 infection and should not be used as the sole basis for  treatment or other patient management decisions. A negative result may occur with  improper specimen collection/handling, submission of specimen other than nasopharyngeal swab, presence of viral mutation(s) within the areas targeted by this assay, and inadequate number of viral copies(<138 copies/mL). A negative result must be combined with clinical observations, patient history, and epidemiological information. The expected result is Negative.  Fact Sheet for Patients:  EntrepreneurPulse.com.au  Fact Sheet for Healthcare Providers:  IncredibleEmployment.be  This test is no t yet approved or cleared by the [place] FDA and  has been authorized for detection and/or diagnosis of SARS-CoV-2 by FDA under an Emergency Use Authorization (EUA). This EUA will remain  in effect (meaning this test can be used) for the duration of the COVID-19 declaration under Section 564(b)(1) of the Act, 21 U.S.C.section 360bbb-3(b)(1), unless the authorization is terminated  or revoked sooner.       Influenza A by PCR NEGATIVE NEGATIVE Final   Influenza B by PCR NEGATIVE NEGATIVE Final    Comment: (NOTE) The Xpert Xpress SARS-CoV-2/FLU/RSV plus assay is intended as an aid in the diagnosis of influenza from Nasopharyngeal swab specimens and should not be used as a sole basis for treatment. Nasal washings and aspirates are unacceptable for Xpert Xpress SARS-CoV-2/FLU/RSV testing.  Fact Sheet for Patients: EntrepreneurPulse.com.au  Fact Sheet for Healthcare Providers: IncredibleEmployment.be  This test is not yet approved or cleared by the [place] FDA and has been authorized for detection and/or diagnosis of SARS-CoV-2 by FDA under an Emergency Use Authorization (EUA). This EUA will remain in effect (meaning this test can be used) for the duration of the COVID-19 declaration under Section 564(b)(1) of the Act, 21  U.S.C. section 360bbb-3(b)(1), unless the authorization is terminated or revoked.  Performed at [facility], [address] [place], [place] [address]   Blood culture (routine x 2)     Status: None   Collection Time: 11/10/21  1:57 PM   Specimen: BLOOD  Result Value Ref Range Status   Specimen Description   Final    BLOOD CENTRAL LINE Performed at [facility], [address] [place], [place] [address]    Special Requests   Final    BOTTLES DRAWN AEROBIC AND ANAEROBIC Blood Culture results may not be optimal due to an excessive volume of blood received in culture bottles Performed at [facility], [address], [place], [place] [address]    Culture   Final    NO GROWTH 5 DAYS Performed at [facility], [place] [address] [place],  [address]    Report Status 11/15/2021 FINAL  Final  Blood culture (routine x 2)     Status: None   Collection Time: 11/10/21  1:57 PM   Specimen: BLOOD  Result Value Ref Range Status   Specimen Description BLOOD CENTRAL LINE  Final   Special Requests  Final    BOTTLES DRAWN AEROBIC AND ANAEROBIC Blood Culture results may not be optimal due to an excessive volume of blood received in culture bottles   Culture   Final    NO GROWTH 5 DAYS Performed at [facility], [address] [place], [place] [address]    Report Status 11/15/2021 FINAL  Final  Resp Panel by RT-PCR (Flu A&B, Covid) Nasopharyngeal Swab     Status: None   Collection Time: 11/15/21  3:02 PM   Specimen: Nasopharyngeal Swab; Nasopharyngeal(NP) swabs in vial transport medium  Result Value Ref Range Status   SARS Coronavirus 2 by RT PCR NEGATIVE NEGATIVE Final    Comment: (NOTE) SARS-CoV-2 target nucleic acids are NOT DETECTED.  The SARS-CoV-2 RNA is generally detectable in upper respiratory specimens during the acute phase of infection. The lowest concentration of SARS-CoV-2 viral copies this assay can detect is 138 copies/mL.  A negative result does not preclude SARS-Cov-2 infection and should not be used as the sole basis for treatment or other patient management decisions. A negative result may occur with  improper specimen collection/handling, submission of specimen other than nasopharyngeal swab, presence of viral mutation(s) within the areas targeted by this assay, and inadequate number of viral copies(<138 copies/mL). A negative result must be combined with clinical observations, patient history, and epidemiological information. The expected result is Negative.  Fact Sheet for Patients:  EntrepreneurPulse.com.au  Fact Sheet for Healthcare Providers:  IncredibleEmployment.be  This test is no t yet approved or cleared by the [place] FDA and  has been authorized for detection and/or diagnosis of SARS-CoV-2 by FDA under an Emergency Use Authorization (EUA). This EUA will remain  in effect (meaning this test can be used) for the duration of the COVID-19 declaration under Section 564(b)(1) of the Act, 21 U.S.C.section 360bbb-3(b)(1), unless the authorization is terminated  or revoked sooner.       Influenza A by PCR NEGATIVE NEGATIVE Final   Influenza B by PCR NEGATIVE NEGATIVE Final    Comment: (NOTE) The Xpert Xpress SARS-CoV-2/FLU/RSV plus assay is intended as an aid in the diagnosis of influenza from Nasopharyngeal swab specimens and should not be used as a sole basis for treatment. Nasal washings and aspirates are unacceptable for Xpert Xpress SARS-CoV-2/FLU/RSV testing.  Fact Sheet for Patients: EntrepreneurPulse.com.au  Fact Sheet for Healthcare Providers: IncredibleEmployment.be  This test is not yet approved or cleared by the [place] FDA and has been authorized for detection and/or diagnosis of SARS-CoV-2 by FDA under an Emergency Use Authorization (EUA). This EUA will remain in effect (meaning this test  can be used) for the duration of the COVID-19 declaration under Section 564(b)(1) of the Act, 21 U.S.C. section 360bbb-3(b)(1), unless the authorization is terminated or revoked.  Performed at [facility], [address] [place], [place] [address]           Radiology Studies: No results found.      Scheduled Meds:  Chlorhexidine Gluconate Cloth  6 each Topical Daily   cyclobenzaprine  10 mg Oral QHS   doxepin  25 mg Oral QHS   enoxaparin (LOVENOX) injection  40 mg Subcutaneous Q24H   feeding supplement  1 Container Oral TID BM   gabapentin  1,800 mg Oral QHS   guaiFENesin  1,200 mg Oral BID   ipratropium-albuterol  3 mL Nebulization TID   lidocaine  1 patch Transdermal Q24H   loratadine  10 mg Oral Daily   mometasone-formoterol  2 puff Inhalation  BID   morphine  60 mg Oral Q12H   multivitamin with minerals  1 tablet Oral Daily   nicotine  21 mg Transdermal Daily   pantoprazole  40 mg Oral Daily   senna-docusate  1 tablet Oral BID   Continuous Infusions:  albumin human       LOS: 6 days    Time spent: 35 minutes    Irine Seal, MD [facility]   To contact the attending provider between 7A-7P or the covering provider during after hours 7P-7A, please log into the web site [email or web address] and access using universal [place] password for that web site. If you do not have the password, please call the hospital operator.  11/17/2021, 10:34 AM

## 2021-11-17 NOTE — Discharge Summary (Signed)
Physician Discharge Summary  Cam Dauphin ZOX:[id number] DOB: [birth year] DOA: 11/10/2021  PCP: [name], PA-C  Admit date: 11/10/2021 Discharge date: 11/17/2021  Time spent: 55 minutes  Recommendations for Outpatient Follow-up:  Patient be discharged to skilled nursing facility.  Follow-up with MD at SNF.  Will need follow-up with speech therapy at facility. Palliative care to follow-up in the outpatient setting. Follow-up with Dr. Rogue Bussing in 2 weeks.   Discharge Diagnoses:  Principal Problem:   Weakness Active Problems:   Pulmonary emphysema (HCC)   Peritoneal carcinomatosis (HCC)   Pneumonia   Palliative care encounter   Protein-calorie malnutrition, severe   Chronic obstructive pulmonary disease (HCC)   Ascites, malignant   Discharge Condition: Stable  Diet recommendation: Dysphagia 2 diet  Filed Weights   11/10/21 1118  Weight: 55.3 kg    History of present illness:  HPI per Dr. Debbora Lacrosse Rozo is a 71 y.o. 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  He denies some productive cough denies any shortness of breath or chest pain.   ED Course: In the ER patient appears nonfocal appears generally weak dehydrated and malnourished.  Abdomen appears benign on exam.  Labs show mild hypokalemia with potassium 3.4 high sensitive troponins were negative lactic acid normal hemoglobin around baseline chest x-ray showing possible infiltrates and mild pleural effusion.  Albumin is 2.6.  COVID test negative.  EKG shows normal sinus rhythm.  Patient admitted for generalized weakness likely from deconditioning poor appetite and possible aspiration pneumonia.  Hospital Course:  #1 probable aspiration pneumonia -Patient admitted with  generalized weakness, poor oral intake.  Work-up done on presentation seem nonfocal.  Labs showed a hypokalemia with potassium of 3.4.  Chest x-ray concerning for possible infiltrates and mild pleural effusion.  COVID-19 PCR negative. -Patient admitted placed on IV antibiotics and completed a 5-day course of IV Levaquin. -Patient remained afebrile. -Patient be discharged in stable and improved condition with no further antibiotics required on discharge.  2.  Generalized weakness -Patient seen by PT/OT who recommended SNF placement.  3.  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 -Patient was scheduled for chemotherapy which was held. -Patient seen in consultation by oncology who assessed and followed the patient during the hospitalization and patient noted to have overall poor prognosis. -Oncology discussed with patient that patient will be reevaluated in the outpatient setting to see if he is able to restart chemotherapy and if patient's condition deteriorates chemotherapy will be discontinued and patient would not be eligible for any further chemotherapy and hospice will be recommended at that time. -Patient to be discharged to skilled nursing facility with hopes of some clinical improvement with palliative care following in the outpatient setting. -Patient will follow-up with oncology 2 weeks post discharge.  4.  Hyperlipidemia -It was noted that patient not taking statin.  5.  Bedbugs -Status post permethrin and patient was placed on contact precautions. -If needed permethrin may be reordered 2 weeks from initial dose.  6.  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 -Patient maintained on nutritional supplementation.  7.  Normocytic anemia/pancytopenia -Fall likely secondary to recent  chemotherapy. -Hemoglobin stabilized at  9.1. -Outpatient follow-up with hematology/oncology.  8. COPD -Remained stable for most of the hospitalization.   -Patient noted to have minimal wheezing on discharge and will be discharged on prednisone 40 mg x 5 days in addition to DuoNebs scheduled x7 days. -Patient maintained on bronchodilators as well as flutter valve and incentive spirometry.  Procedures: -Abdominal ultrasound 11/14/2021, 11/17/2021 Lower extremity Dopplers 11/10/2021 Plain films of the abdomen 11/10/2021 Chest x-ray 11/10/2021 11/10/2021    Consultations: Oncology: Dr. Rogue Bussing 11/13/2021  Discharge Exam: Vitals:   11/17/21 0454 11/17/21 0739  BP: 115/78 116/75  Pulse: 94 93  Resp: 16 17  Temp: 98.9 F (37.2 C) 99 F (37.2 C)  SpO2: 96% 97%    General: Frail.  Emaciated.  Cachectic.  Chronically ill-appearing.  Barrel chest. Cardiovascular: Regular rate rhythm no murmurs rubs or gallops.  No JVD.  No lower extremity edema. Respiratory: Minimal expiratory wheezing.  No crackles.  No rhonchi.  Fair air movement.    Discharge Instructions   Discharge Instructions     Diet - low sodium heart healthy   Complete by: As directed    Dysphagia 2 with thin liquids   Diet general   Complete by: As directed    Dysphagia 2 diet   Increase activity slowly   Complete by: As directed    Increase activity slowly   Complete by: As directed       Allergies as of 11/17/2021       Reactions   Bee Venom Anaphylaxis   Penicillins Anaphylaxis, Swelling   Did it involve swelling of the face/tongue/throat, SOB, or low BP? Yes Did it involve sudden or severe rash/hives, skin peeling, or any reaction on the inside of your mouth or nose? No Did you need to seek medical attention at a hospital or doctor's office? Yes When did it last happen?  Many years ago If all above answers are "NO", may proceed with cephalosporin use.        Medication List     STOP taking these  medications    chlorproMAZINE 100 MG tablet Commonly known as: THORAZINE   Clenpiq 10-3.5-12 MG-GM -GM/160ML Soln Generic drug: Sod Picosulfate-Mag Ox-Cit Acd   nystatin 100000 UNIT/ML suspension Commonly known as: MYCOSTATIN   rOPINIRole 1 MG tablet Commonly known as: REQUIP   rosuvastatin 5 MG tablet Commonly known as: Crestor       TAKE these medications    albuterol 108 (90 Base) MCG/ACT inhaler Commonly known as: VENTOLIN HFA Inhale 2 puffs into the lungs every 6 (six) hours as needed for wheezing or shortness of breath.   aspirin EC 81 MG tablet Take 81 mg by mouth daily. Swallow whole.   cyclobenzaprine 10 MG tablet Commonly known as: FLEXERIL Take 10 mg by mouth at bedtime.   cyclobenzaprine 10 MG tablet Commonly known as: FLEXERIL Take 10 mg by mouth 3 (three) times daily as needed for muscle spasms.   dexamethasone 4 MG tablet Commonly known as: DECADRON Take 2 tablets (8 mg total) by mouth daily. Start the day after chemotherapy for 2 days. Take with food.   doxepin 25 MG capsule Commonly known as: SINEQUAN Take 25 mg by mouth at bedtime.   fluticasone-salmeterol 250-50 MCG/ACT Aepb Commonly known as: ADVAIR Inhale 1 puff into the lungs in the morning and at bedtime.   gabapentin 600 MG tablet Commonly known as: NEURONTIN Take 1,800 mg by mouth at bedtime.   guaiFENesin 600 MG 12 hr tablet Commonly known as: MUCINEX  Take 2 tablets (1,200 mg total) by mouth 2 (two) times daily for 3 days.   ipratropium-albuterol 0.5-2.5 (3) MG/3ML Soln Commonly known as: DUONEB Take 3 mLs by nebulization every 6 (six) hours as needed.   ipratropium-albuterol 0.5-2.5 (3) MG/3ML Soln Commonly known as: DUONEB Take 3 mLs by nebulization 3 (three) times daily for 7 days.   lidocaine-prilocaine cream Commonly known as: EMLA Apply to affected area once   loratadine 10 MG tablet Commonly known as: CLARITIN Take 1 tablet (10 mg total) by mouth daily.    morphine 60 MG 12 hr tablet Commonly known as: MS CONTIN Take 1 tablet (60 mg total) by mouth every 12 (twelve) hours.   nicotine 21 mg/24hr patch Commonly known as: NICODERM CQ - dosed in mg/24 hours Place 1 patch (21 mg total) onto the skin daily. Start taking on: November 18, 2021   ondansetron 8 MG tablet Commonly known as: Zofran Take 1 tablet (8 mg total) by mouth 2 (two) times daily as needed for refractory nausea / vomiting. Start on day 3 after chemotherapy.   oxyCODONE-acetaminophen 5-325 MG tablet Commonly known as: PERCOCET/ROXICET Take 1 tablet by mouth every 4 (four) hours as needed for severe pain.   pantoprazole 20 MG tablet Commonly known as: Protonix Take 2 tablets (40 mg total) by mouth daily. What changed: how much to take   predniSONE 20 MG tablet Commonly known as: DELTASONE Take 2 tablets (40 mg total) by mouth daily with breakfast for 5 days. Start taking on: November 18, 2021   prochlorperazine 10 MG tablet Commonly known as: COMPAZINE Take 1 tablet (10 mg total) by mouth every 6 (six) hours as needed (Nausea or vomiting).   senna-docusate 8.6-50 MG tablet Commonly known as: Senokot-S Take 1 tablet by mouth 2 (two) times daily.   Spiriva HandiHaler 18 MCG inhalation capsule Generic drug: tiotropium Place 1 capsule (18 mcg total) into inhaler and inhale daily. Start taking on: November 24, 2021   Vitamin D 50 MCG (2000 UT) Caps Take 2,000 Units by mouth daily.       Allergies  Allergen Reactions   Bee Venom Anaphylaxis   Penicillins Anaphylaxis and Swelling    Did it involve swelling of the face/tongue/throat, SOB, or low BP? Yes Did it involve sudden or severe rash/hives, skin peeling, or any reaction on the inside of your mouth or nose? No Did you need to seek medical attention at a hospital or doctor's office? Yes When did it last happen?  Many years ago If all above answers are "NO", may proceed with cephalosporin use.      Follow-up Information     MD AT SNF Follow up.          Cammie Sickle, MD. Go on 11/29/2021.   Specialties: Internal Medicine, Oncology Why: @ 8:45am Contact information: [address] [place] [phone]         Palliative Care Follow up.                   The results of significant diagnostics from this hospitalization (including imaging, microbiology, ancillary and laboratory) are listed below for reference.    Significant Diagnostic Studies: DG Abd 1 View  Result Date: 11/10/2021 CLINICAL DATA:  Generalized weakness. History of stage IV gastric cancer. EXAM: ABDOMEN - 1 VIEW COMPARISON:  None. FINDINGS: The bowel gas pattern is normal. No radio-opaque calculi or other significant radiographic abnormality are seen. IMPRESSION: No abnormal bowel dilatation is  noted. Electronically Signed   By: Marijo Conception M.D.   On: 11/10/2021 14:56   CT ABDOMEN PELVIS W CONTRAST  Result Date: 11/10/2021 CLINICAL DATA:  Nausea/vomiting weakness EXAM: CT ABDOMEN AND PELVIS WITH CONTRAST TECHNIQUE: Multidetector CT imaging of the abdomen and pelvis was performed using the standard protocol following bolus administration of intravenous contrast. RADIATION DOSE REDUCTION: This exam was performed according to the departmental dose-optimization program which includes automated exposure control, adjustment of the mA and/or kV according to patient size and/or use of iterative reconstruction technique. CONTRAST:  45mL OMNIPAQUE IOHEXOL 300 MG/ML  SOLN COMPARISON:  CT 09/19/2021 FINDINGS: Lower chest: There is a new moderate size right pleural effusion with adjacent basilar atelectasis. Hepatobiliary: Scalloping of the liver parenchymal contours related to malignant ascites. The gallbladder is unremarkable. Pancreas: Unremarkable. No pancreatic ductal dilatation or surrounding inflammatory changes. Spleen: Normal in size without focal abnormality. Adrenals/Urinary Tract:  Adrenal glands are unremarkable. Kidneys are normal, without renal calculi, focal lesion, or hydronephrosis. The bladder is mildly distended but unremarkable. Stomach/Bowel: No evidence of bowel obstruction. Displacement of the viscera due to viscous/malignant ascites. The appendix appears decompressed. Known colon cancer primary is not well visualized by CT but possibly representative on coronal image 53 where there is focal wall thickening near the rectosigmoid junction. Vascular/Lymphatic: Aortoiliac atherosclerotic calcifications. No AAA. Reproductive: Unremarkable. Other: Cachectic appearance. Large volume abdominopelvic exudate of ascites, increased from prior exam, with diffuse peritoneal carcinomatosis and omental caking. Increased thickening of peritoneal metastases along the right upper quadrant, measuring 1.5 cm in thickness, previously 0.9 cm (series 2, image 22). Increased peritoneal soft tissue density in the pelvis. Musculoskeletal: Prior L4-S1 lumbar fusion. Moderate severe degenerative disc disease at L2-L3. Unchanged chronic right pelvic defect. IMPRESSION: New moderate size right pleural effusion with adjacent basilar atelectasis. Worsening diffuse peritoneal carcinomatosis. Increased, large volume malignant ascites. No evidence of bowel obstruction. Electronically Signed   By: Maurine Simmering M.D.   On: 11/10/2021 16:56   US Venous Img Lower Bilateral (DVT)  Result Date: 11/10/2021 CLINICAL DATA:  Pain EXAM: Bilateral LOWER EXTREMITY VENOUS DOPPLER ULTRASOUND TECHNIQUE: Gray-scale sonography with compression, as well as color and duplex ultrasound, were performed to evaluate the deep venous system(s) from the level of the common femoral vein through the popliteal and proximal calf veins. COMPARISON:  None. FINDINGS: VENOUS Normal compressibility of the common femoral, superficial femoral, and popliteal veins, as well as the visualized calf veins on both sides. Visualized portions of profunda  femoral vein and great saphenous vein unremarkable. No filling defects to suggest DVT on grayscale or color Doppler imaging. Doppler waveforms show normal direction of venous flow, normal respiratory plasticity and response to augmentation. . OTHER None. Limitations: none IMPRESSION: There is no evidence of deep venous thrombosis in both lower extremities. Electronically Signed   By: Elmer Picker M.D.   On: 11/10/2021 17:41   CT BIOPSY  Result Date: 10/27/2021 CLINICAL DATA:  Peritoneal carcinomatosis and ascites of unknown primary with prior paracentesis demonstrating malignant cells in ascites. Material was not sufficient for additional molecular analysis and core biopsy has been requested for further tissue analysis. EXAM: CT GUIDED CORE BIOPSY OF PERITONEAL/OMENTAL TUMOR ANESTHESIA/SEDATION: Moderate (conscious) sedation was employed during this procedure. A total of Versed 1.0 mg and Fentanyl 50 mcg was administered intravenously by radiology nursing. Moderate Sedation Time: 12 minutes. The patient's level of consciousness and vital signs were monitored continuously by radiology nursing throughout the procedure under my direct supervision. PROCEDURE: The procedure risks,  benefits, and alternatives were explained to the patient. Questions regarding the procedure were encouraged and answered. The patient understands and consents to the procedure. A time-out was performed prior to initiating the procedure. CT was performed through the abdomen and pelvis. The left lower abdominal wall was prepped with chlorhexidine in a sterile fashion, and a sterile drape was applied covering the operative field. A sterile gown and sterile gloves were used for the procedure. Local anesthesia was provided with 1% Lidocaine. Under CT guidance, a 17 gauge trocar needle was advanced from a left anterior approach into the left peritoneal cavity. After confirming needle tip position, coaxial 18 gauge core biopsy samples were  obtained. Four separate core biopsy samples were obtained and submitted in formalin. COMPLICATIONS: None FINDINGS: A region of peritoneal tumor demonstrating increased metabolic activity by PET scan in the left lateral lower peritoneal cavity was targeted. Solid, mucinous appearing tumor was obtained. IMPRESSION: CT-guided core biopsy performed peritoneal/omental tumor in the left lateral lower peritoneal cavity. Solid tissue was obtained. RADIATION DOSE REDUCTION: This exam was performed according to the departmental dose-optimization program which includes automated exposure control, adjustment of the mA and/or kV according to patient size and/or use of iterative reconstruction technique. Electronically Signed   By: Aletta Edouard M.D.   On: 10/27/2021 13:34   DG Chest Port 1 View  Result Date: 11/10/2021 CLINICAL DATA:  Weakness EXAM: PORTABLE CHEST 1 VIEW COMPARISON:  Chest x-ray dated August 03, 2019 FINDINGS: Cardiac and mediastinal contours are within normal limits. Right chest wall port with tip positioned near the superior cavoatrial junction. New right basilar opacity. Small right pleural effusion. IMPRESSION: 1. New right basilar opacity, concerning for infection or aspiration. 2. Small right pleural effusion. Electronically Signed   By: Yetta Glassman M.D.   On: 11/10/2021 12:39   [place] ASCITES (ABDOMEN LIMITED)  Result Date: 11/17/2021 CLINICAL DATA:  Abdominal distension.  Peritoneal carcinomatosis. EXAM: LIMITED ABDOMEN ULTRASOUND FOR ASCITES TECHNIQUE: Limited ultrasound survey for ascites was performed in all four abdominal quadrants. COMPARISON:  11/14/2021 FINDINGS: Similar small volume abdominal ascites with marked peritoneal carcinomatosis. IMPRESSION: Small volume ascites with marked peritoneal carcinomatosis. Electronically Signed   By: Abigail Miyamoto M.D.   On: 11/17/2021 12:31   [place] ASCITES (ABDOMEN LIMITED)  Result Date: 11/14/2021 CLINICAL DATA:  71 year old male referred for  possible paracentesis with known malignancy. EXAM: LIMITED ABDOMEN ULTRASOUND FOR ASCITES TECHNIQUE: Limited ultrasound survey for ascites was performed in all four abdominal quadrants. COMPARISON:  None. FINDINGS: Limited ultrasound demonstrates small volume ascites, with significant peritoneal soft tissue compatible with metastases. IMPRESSION: Small volume ascites by ultrasound, with evidence of significant peritoneal metastases. Electronically Signed   By: Corrie Mckusick D.O.   On: 11/14/2021 16:47   [place] ASCITES (ABDOMEN LIMITED)  Result Date: 11/03/2021 CLINICAL DATA:  Ascites EXAM: LIMITED ABDOMEN ULTRASOUND FOR ASCITES TECHNIQUE: Limited ultrasound survey for ascites was performed in all four abdominal quadrants. COMPARISON:  None. FINDINGS: Limited sonographic exam of the abdomen was performed. Images demonstrate small volume ascites, primarily in the right upper quadrant, with very little ascites in the lower quadrants for safe image guided paracentesis. No paracentesis performed. IMPRESSION: Small volume ascites.  No paracentesis performed. Electronically Signed   By: Albin Felling M.D.   On: 11/03/2021 15:16   IR IMAGING GUIDED PORT INSERTION  Result Date: 10/27/2021 CLINICAL DATA:  Peritoneal carcinomatosis and need for porta cath for chemotherapy. EXAM: IMPLANTED PORT A CATH PLACEMENT WITH ULTRASOUND AND FLUOROSCOPIC GUIDANCE ANESTHESIA/SEDATION: Moderate (conscious) sedation  was employed during this procedure. A total of Versed 1.0 mg and Fentanyl 50 mcg was administered intravenously by radiology nursing. Moderate Sedation Time: 23 minutes. The patient's level of consciousness and vital signs were monitored continuously by radiology nursing throughout the procedure under my direct supervision. FLUOROSCOPY TIME:  42 seconds.  3.3 mGy. PROCEDURE: The procedure, risks, benefits, and alternatives were explained to the patient. Questions regarding the procedure were encouraged and answered. The  patient understands and consents to the procedure. A time-out was performed prior to initiating the procedure. Ultrasound was utilized to confirm patency of the right internal jugular vein. A permanent ultrasound image was recorded. The right neck and chest were prepped with chlorhexidine in a sterile fashion, and a sterile drape was applied covering the operative field. Maximum barrier sterile technique with sterile gowns and gloves were used for the procedure. Local anesthesia was provided with 1% lidocaine. After creating a small venotomy incision, a 21 gauge needle was advanced into the right internal jugular vein under direct, real-time ultrasound guidance. Ultrasound image documentation was performed. After securing guidewire access, an 8 Fr dilator was placed. A J-wire was kinked to measure appropriate catheter length. A subcutaneous port pocket was then created along the upper chest wall utilizing sharp and blunt dissection. Portable cautery was utilized. The pocket was irrigated with sterile saline. A single lumen power injectable port was chosen for placement. The 8 Fr catheter was tunneled from the port pocket site to the venotomy incision. The port was placed in the pocket. External catheter was trimmed to appropriate length based on guidewire measurement. At the venotomy, an 8 Fr peel-away sheath was placed over a guidewire. The catheter was then placed through the sheath and the sheath removed. Final catheter positioning was confirmed and documented with a fluoroscopic spot image. The port was accessed with a needle and aspirated and flushed with heparinized saline. The access needle was removed. The venotomy and port pocket incisions were closed with subcutaneous 3-0 Monocryl and subcuticular 4-0 Vicryl. Dermabond was applied to both incisions. COMPLICATIONS: COMPLICATIONS None FINDINGS: After catheter placement, the tip lies at the cavo-atrial junction. The catheter aspirates normally and is ready  for immediate use. IMPRESSION: Placement of single lumen port a cath via right internal jugular vein. The catheter tip lies at the cavo-atrial junction. A power injectable port a cath was placed and is ready for immediate use. Electronically Signed   By: Aletta Edouard M.D.   On: 10/27/2021 13:35    Microbiology: Recent Results (from the past 240 hour(s))  Resp Panel by RT-PCR (Flu A&B, Covid) Nasopharyngeal Swab     Status: None   Collection Time: 11/10/21 12:44 PM   Specimen: Nasopharyngeal Swab; Nasopharyngeal(NP) swabs in vial transport medium  Result Value Ref Range Status   SARS Coronavirus 2 by RT PCR NEGATIVE NEGATIVE Final    Comment: (NOTE) SARS-CoV-2 target nucleic acids are NOT DETECTED.  The SARS-CoV-2 RNA is generally detectable in upper respiratory specimens during the acute phase of infection. The lowest concentration of SARS-CoV-2 viral copies this assay can detect is 138 copies/mL. A negative result does not preclude SARS-Cov-2 infection and should not be used as the sole basis for treatment or other patient management decisions. A negative result may occur with  improper specimen collection/handling, submission of specimen other than nasopharyngeal swab, presence of viral mutation(s) within the areas targeted by this assay, and inadequate number of viral copies(<138 copies/mL). A negative result must be combined with clinical observations, patient history,  and epidemiological information. The expected result is Negative.  Fact Sheet for Patients:  EntrepreneurPulse.com.au  Fact Sheet for Healthcare Providers:  IncredibleEmployment.be  This test is no t yet approved or cleared by the [place] FDA and  has been authorized for detection and/or diagnosis of SARS-CoV-2 by FDA under an Emergency Use Authorization (EUA). This EUA will remain  in effect (meaning this test can be used) for the duration of the COVID-19 declaration  under Section 564(b)(1) of the Act, 21 U.S.C.section 360bbb-3(b)(1), unless the authorization is terminated  or revoked sooner.       Influenza A by PCR NEGATIVE NEGATIVE Final   Influenza B by PCR NEGATIVE NEGATIVE Final    Comment: (NOTE) The Xpert Xpress SARS-CoV-2/FLU/RSV plus assay is intended as an aid in the diagnosis of influenza from Nasopharyngeal swab specimens and should not be used as a sole basis for treatment. Nasal washings and aspirates are unacceptable for Xpert Xpress SARS-CoV-2/FLU/RSV testing.  Fact Sheet for Patients: EntrepreneurPulse.com.au  Fact Sheet for Healthcare Providers: IncredibleEmployment.be  This test is not yet approved or cleared by the [place] FDA and has been authorized for detection and/or diagnosis of SARS-CoV-2 by FDA under an Emergency Use Authorization (EUA). This EUA will remain in effect (meaning this test can be used) for the duration of the COVID-19 declaration under Section 564(b)(1) of the Act, 21 U.S.C. section 360bbb-3(b)(1), unless the authorization is terminated or revoked.  Performed at [facility], [address] [place], [place] [address]   Blood culture (routine x 2)     Status: None   Collection Time: 11/10/21  1:57 PM   Specimen: BLOOD  Result Value Ref Range Status   Specimen Description   Final    BLOOD CENTRAL LINE Performed at [facility], [address], [place], [place] [address]    Special Requests   Final    BOTTLES DRAWN AEROBIC AND ANAEROBIC Blood Culture results may not be optimal due to an excessive volume of blood received in culture bottles Performed at [facility], [address] [place], [place] [address]    Culture   Final    NO GROWTH 5 DAYS Performed at [facility], [place] [address] [place], [place] [address]    Report Status 11/15/2021 FINAL  Final  Blood culture (routine x 2)     Status: None   Collection  Time: 11/10/21  1:57 PM   Specimen: BLOOD  Result Value Ref Range Status   Specimen Description BLOOD CENTRAL LINE  Final   Special Requests   Final    BOTTLES DRAWN AEROBIC AND ANAEROBIC Blood Culture results may not be optimal due to an excessive volume of blood received in culture bottles   Culture   Final    NO GROWTH 5 DAYS Performed at [facility], [address] [place], [place] [address]    Report Status 11/15/2021 FINAL  Final  Resp Panel by RT-PCR (Flu A&B, Covid) Nasopharyngeal Swab     Status: None   Collection Time: 11/15/21  3:02 PM   Specimen: Nasopharyngeal Swab; Nasopharyngeal(NP) swabs in vial transport medium  Result Value Ref Range Status   SARS Coronavirus 2 by RT PCR NEGATIVE NEGATIVE Final    Comment: (NOTE) SARS-CoV-2 target nucleic acids are NOT DETECTED.  The SARS-CoV-2 RNA is generally detectable in upper respiratory specimens during the acute phase of infection. The lowest concentration of SARS-CoV-2 viral copies this assay can detect is 138 copies/mL. A  negative result does not preclude SARS-Cov-2 infection and should not be used as the sole basis for treatment or other patient management decisions. A negative result may occur with  improper specimen collection/handling, submission of specimen other than nasopharyngeal swab, presence of viral mutation(s) within the areas targeted by this assay, and inadequate number of viral copies(<138 copies/mL). A negative result must be combined with clinical observations, patient history, and epidemiological information. The expected result is Negative.  Fact Sheet for Patients:  EntrepreneurPulse.com.au  Fact Sheet for Healthcare Providers:  IncredibleEmployment.be  This test is no t yet approved or cleared by the [place] FDA and  has been authorized for detection and/or diagnosis of SARS-CoV-2 by FDA under an Emergency Use Authorization (EUA). This EUA  will remain  in effect (meaning this test can be used) for the duration of the COVID-19 declaration under Section 564(b)(1) of the Act, 21 U.S.C.section 360bbb-3(b)(1), unless the authorization is terminated  or revoked sooner.       Influenza A by PCR NEGATIVE NEGATIVE Final   Influenza B by PCR NEGATIVE NEGATIVE Final    Comment: (NOTE) The Xpert Xpress SARS-CoV-2/FLU/RSV plus assay is intended as an aid in the diagnosis of influenza from Nasopharyngeal swab specimens and should not be used as a sole basis for treatment. Nasal washings and aspirates are unacceptable for Xpert Xpress SARS-CoV-2/FLU/RSV testing.  Fact Sheet for Patients: EntrepreneurPulse.com.au  Fact Sheet for Healthcare Providers: IncredibleEmployment.be  This test is not yet approved or cleared by the [place] FDA and has been authorized for detection and/or diagnosis of SARS-CoV-2 by FDA under an Emergency Use Authorization (EUA). This EUA will remain in effect (meaning this test can be used) for the duration of the COVID-19 declaration under Section 564(b)(1) of the Act, 21 U.S.C. section 360bbb-3(b)(1), unless the authorization is terminated or revoked.  Performed at [facility], [address] [place], [place] [address]      Labs: Basic Metabolic Panel: Recent Labs  Lab 11/11/21 0559 11/13/21 0340 11/14/21 0420 11/15/21 0510 11/16/21 0825 11/17/21 0457  NA 136 134* 131* 132* 132*  --   K 3.6 3.9 4.0 3.8 3.7  --   CL 102 102 100 99 99  --   CO2 27 27 28 26 26   --   GLUCOSE 98 95 105* 116* 101*  --   BUN 18 19 20 19 16   --   CREATININE 0.58* 0.60* 0.65 0.54* 0.46* 0.59*  CALCIUM 7.9* 7.9* 7.9* 7.8* 7.7*  --    Liver Function Tests: Recent Labs  Lab 11/11/21 0559  AST 18  ALT 15  ALKPHOS 54  BILITOT 0.4  PROT 5.4*  ALBUMIN 2.5*   No results for input(s): LIPASE, AMYLASE in the last 168 hours. No results for input(s): AMMONIA in  the last 168 hours. CBC: Recent Labs  Lab 11/11/21 0559 11/13/21 0340 11/14/21 0420 11/15/21 0510 11/16/21 0825  WBC 4.2 4.6 4.0 3.2* 2.3*  HGB 9.9* 9.7* 8.9* 9.1* 9.7*  HCT 29.6* 29.0* 27.2* 27.3* 29.1*  MCV 87.8 85.5 85.5 85.0 84.1  PLT 147* 131* 130* 132* 156   Cardiac Enzymes: No results for input(s): CKTOTAL, CKMB, CKMBINDEX, TROPONINI in the last 168 hours. BNP: BNP (last 3 results) No results for input(s): BNP in the last 8760 hours.  ProBNP (last 3 results) No results for input(s): PROBNP in the last 8760 hours.  CBG: No results for input(s): GLUCAP in the last 168 hours.     Signed:  Quillian Quince  Grandville Silos MD.  [facility] 11/17/2021, 2:25 PM

## 2021-11-23 ENCOUNTER — Other Ambulatory Visit: Payer: Self-pay | Admitting: Oncology

## 2021-11-29 ENCOUNTER — Inpatient Hospital Stay: Payer: Medicare PPO

## 2021-11-29 ENCOUNTER — Inpatient Hospital Stay (HOSPITAL_BASED_OUTPATIENT_CLINIC_OR_DEPARTMENT_OTHER): Payer: Medicare PPO | Admitting: Hospice and Palliative Medicine

## 2021-11-29 ENCOUNTER — Inpatient Hospital Stay: Payer: Medicare PPO | Attending: Internal Medicine

## 2021-11-29 ENCOUNTER — Inpatient Hospital Stay (HOSPITAL_BASED_OUTPATIENT_CLINIC_OR_DEPARTMENT_OTHER): Payer: Medicare PPO | Admitting: Internal Medicine

## 2021-11-29 ENCOUNTER — Other Ambulatory Visit: Payer: Self-pay

## 2021-11-29 DIAGNOSIS — R112 Nausea with vomiting, unspecified: Secondary | ICD-10-CM | POA: Diagnosis not present

## 2021-11-29 DIAGNOSIS — Z880 Allergy status to penicillin: Secondary | ICD-10-CM | POA: Diagnosis not present

## 2021-11-29 DIAGNOSIS — Z91030 Bee allergy status: Secondary | ICD-10-CM | POA: Diagnosis not present

## 2021-11-29 DIAGNOSIS — M549 Dorsalgia, unspecified: Secondary | ICD-10-CM | POA: Diagnosis not present

## 2021-11-29 DIAGNOSIS — J449 Chronic obstructive pulmonary disease, unspecified: Secondary | ICD-10-CM | POA: Insufficient documentation

## 2021-11-29 DIAGNOSIS — R634 Abnormal weight loss: Secondary | ICD-10-CM | POA: Insufficient documentation

## 2021-11-29 DIAGNOSIS — R109 Unspecified abdominal pain: Secondary | ICD-10-CM | POA: Insufficient documentation

## 2021-11-29 DIAGNOSIS — R5383 Other fatigue: Secondary | ICD-10-CM | POA: Insufficient documentation

## 2021-11-29 DIAGNOSIS — Z602 Problems related to living alone: Secondary | ICD-10-CM | POA: Insufficient documentation

## 2021-11-29 DIAGNOSIS — M5136 Other intervertebral disc degeneration, lumbar region: Secondary | ICD-10-CM | POA: Insufficient documentation

## 2021-11-29 DIAGNOSIS — R140 Abdominal distension (gaseous): Secondary | ICD-10-CM | POA: Insufficient documentation

## 2021-11-29 DIAGNOSIS — R180 Malignant ascites: Secondary | ICD-10-CM | POA: Diagnosis not present

## 2021-11-29 DIAGNOSIS — R531 Weakness: Secondary | ICD-10-CM | POA: Diagnosis not present

## 2021-11-29 DIAGNOSIS — R0602 Shortness of breath: Secondary | ICD-10-CM | POA: Diagnosis not present

## 2021-11-29 DIAGNOSIS — Z800 Family history of malignant neoplasm of digestive organs: Secondary | ICD-10-CM | POA: Insufficient documentation

## 2021-11-29 DIAGNOSIS — C20 Malignant neoplasm of rectum: Secondary | ICD-10-CM | POA: Diagnosis not present

## 2021-11-29 DIAGNOSIS — Z818 Family history of other mental and behavioral disorders: Secondary | ICD-10-CM | POA: Diagnosis not present

## 2021-11-29 DIAGNOSIS — Z79899 Other long term (current) drug therapy: Secondary | ICD-10-CM | POA: Insufficient documentation

## 2021-11-29 DIAGNOSIS — E860 Dehydration: Secondary | ICD-10-CM | POA: Diagnosis not present

## 2021-11-29 DIAGNOSIS — C19 Malignant neoplasm of rectosigmoid junction: Secondary | ICD-10-CM | POA: Diagnosis not present

## 2021-11-29 DIAGNOSIS — M7989 Other specified soft tissue disorders: Secondary | ICD-10-CM | POA: Insufficient documentation

## 2021-11-29 DIAGNOSIS — C786 Secondary malignant neoplasm of retroperitoneum and peritoneum: Secondary | ICD-10-CM

## 2021-11-29 DIAGNOSIS — Z85028 Personal history of other malignant neoplasm of stomach: Secondary | ICD-10-CM | POA: Insufficient documentation

## 2021-11-29 LAB — CBC WITH DIFFERENTIAL/PLATELET
Abs Immature Granulocytes: 0.03 10*3/uL (ref 0.00–0.07)
Basophils Absolute: 0 10*3/uL (ref 0.0–0.1)
Basophils Relative: 0 %
Eosinophils Absolute: 0 10*3/uL (ref 0.0–0.5)
Eosinophils Relative: 0 %
HCT: 27.8 % — ABNORMAL LOW (ref 39.0–52.0)
Hemoglobin: 9.3 g/dL — ABNORMAL LOW (ref 13.0–17.0)
Immature Granulocytes: 0 %
Lymphocytes Relative: 11 %
Lymphs Abs: 0.9 10*3/uL (ref 0.7–4.0)
MCH: 28.4 pg (ref 26.0–34.0)
MCHC: 33.5 g/dL (ref 30.0–36.0)
MCV: 85 fL (ref 80.0–100.0)
Monocytes Absolute: 0.7 10*3/uL (ref 0.1–1.0)
Monocytes Relative: 9 %
Neutro Abs: 6.4 10*3/uL (ref 1.7–7.7)
Neutrophils Relative %: 80 %
Platelets: 293 10*3/uL (ref 150–400)
RBC: 3.27 MIL/uL — ABNORMAL LOW (ref 4.22–5.81)
RDW: 16.9 % — ABNORMAL HIGH (ref 11.5–15.5)
WBC: 8.1 10*3/uL (ref 4.0–10.5)
nRBC: 0 % (ref 0.0–0.2)

## 2021-11-29 LAB — COMPREHENSIVE METABOLIC PANEL
ALT: 19 U/L (ref 0–44)
AST: 23 U/L (ref 15–41)
Albumin: 2.4 g/dL — ABNORMAL LOW (ref 3.5–5.0)
Alkaline Phosphatase: 79 U/L (ref 38–126)
Anion gap: 10 (ref 5–15)
BUN: 16 mg/dL (ref 8–23)
CO2: 26 mmol/L (ref 22–32)
Calcium: 8.1 mg/dL — ABNORMAL LOW (ref 8.9–10.3)
Chloride: 94 mmol/L — ABNORMAL LOW (ref 98–111)
Creatinine, Ser: 0.61 mg/dL (ref 0.61–1.24)
GFR, Estimated: >60 mL/min (ref >60)
Glucose, Bld: 113 mg/dL — ABNORMAL HIGH (ref 70–99)
Potassium: 4.2 mmol/L (ref 3.5–5.1)
Sodium: 130 mmol/L — ABNORMAL LOW (ref 135–145)
Total Bilirubin: 0.3 mg/dL (ref 0.3–1.2)
Total Protein: 5.9 g/dL — ABNORMAL LOW (ref 6.5–8.1)

## 2021-11-29 MED ORDER — HEPARIN SOD (PORK) LOCK FLUSH 100 UNIT/ML IV SOLN
500.0000 [IU] | Freq: Once | INTRAVENOUS | Status: AC
  Administered 2021-11-29: 500 [IU] via INTRAVENOUS
  Filled 2021-11-29: qty 5

## 2021-11-29 NOTE — Assessment & Plan Note (Addendum)
#   Omental caking/peritoneal thickening/ascites-s/p biopsy adenocarcinoma of colorectal origin.  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  No discrete hypermetabolic rectal or colonic mass is identified.  Stage IV cancer. ? ?#Poor tolerance to chemotherapy status post cycle 1 of FOLFOX.  Discussed that patient is high risk of ongoing side effects from chemotherapy/hospitalization and risk of death.  Would recommend discontinue chemotherapy in the context of his progressive malignancy/poor tolerance to therapy/declining performance status. ? ?Given the incurable nature of the disease /poor tolerance of therapy and in general less than 6 months of life expectancy-I introduced hospice philosophy to the patient and family.  Discussed that goal of care should be directed to symptom management rather than treating the underlying disease; and in the process help improve quality of life rather than quantity.  Discussed the options of hospice including the facility versus home.  Given patient's social situation [lives alone at home]-patient not a candidate for hospice at home.  Discussed with Josh Borders; palliative care.  Recommend palliative care evaluation at the facility/and discharge to hospice facility if clinically appropriate. ? ?#Abdominal pain/malignant ascites.-Ultrasound x2 over the last 2 weeks negative for any fluid; peritoneal carcinomatosis causing the pain.  Continue supportive care. ? ?#Right lower chest wall pain/upper quadrant pain-likely secondary to malignancy [[on MS Contin; Percocet; Dr.Marks, [place]; [employer] [facility]] _STABLE.  ? ?#  Large area of hypermetabolism involving the right aspect of the tongue without discrete mass seen on CT-clinically no mass seen-s/p ENT evaluation no mass noted.  Discussed with Dr. Richardson Landry ?? ?# COPD-chronic STABLE ? ?# IV access: s/p mediport-functional- ? ? SIL- Anneette- [phone]/cell; H-  [phone] ? ?# DISPOSITION: ?# de-access ?# follow up as needed-Dr.B ? ?# 40 minutes face-to-face with the patient discussing the above plan of care; more than 50% of time spent on prognosis/ natural history; counseling and coordination. ? ? ? ?

## 2021-11-29 NOTE — Progress Notes (Signed)
Patient is a resident at [facility].  Reports he did not get his medication, which includes Morphine, from facility this morning.  Low back pain is 6-7/10 and feels tightness around abdomen.  Is on a chopped food diet which helps him with swallowing and drinks 2 meal replacements a day. ?

## 2021-11-29 NOTE — Progress Notes (Signed)
[place] NOTE  Patient Care Team: [name], PA-C as PCP - General (Family Medicine) Kate Sable, MD as PCP - Cardiology (Cardiology) Virgel Manifold, MD (Inactive) as Consulting Physician (Gastroenterology) Luetta Nutting Sharyn Dross, MD as Referring Physician (Pain Medicine) Cammie Sickle, MD as Consulting Physician (Medical Oncology) Kate Sable, MD as Consulting Physician (Cardiology) [name], MD as Consulting Physician (Colon and Rectal Surgery)  CHIEF COMPLAINTS/PURPOSE OF CONSULTATION: RECTAL MASS   Oncology History Overview Note  # JULy 2020- RECTAL MASS-frond-like/villous non-obstructing large mass partially circumferential (involving one half of the lumen circumference; Dr.Tahiliani]; CT C/A/P- NEGATIVE METASTATIC DISEASE; Bx- FRAGMENTS OF TUBULOVILLOUS ADENOMA WITH FOCAL HIGH-GRADE DYSPLASIA; - INVASIVE CARCINOMA NOT IDENTIFIED.   # July 2020- Severe IDA- Hb 3.5 [s/p PRBC]  SURGICAL PATHOLOGY  CASE: [id number]  PATIENT: [name]  Surgical Pathology Report      Specimen Submitted:  A. Colon polyp x8, transverse; cold snare  B. Colon polyp x2, sigmoid; cold snare  C. Rectum polyp; cbx   Clinical History: Colon cancer screening Z12.11.  History of colonic  polyps Z86.010.  Colon polyps       DIAGNOSIS:  A. COLON POLYPS X8, TRANSVERSE; COLD SNARE:  - MULTIPLE FRAGMENTS OF TUBULAR ADENOMAS.  - NEGATIVE FOR HIGH-GRADE DYSPLASIA OR MALIGNANCY.   B. COLON POLYPS X2, SIGMOID; COLD SNARE:  - MULTIPLE FRAGMENTS OF TUBULAR ADENOMAS.  - NEGATIVE FOR HIGH-GRADE DYSPLASIA AND MALIGNANCY.   C. RECTAL POLYP; COLD BIOPSY:  - SUPERFICIAL STRIPS OF TUBULOVILLOUS ADENOMA.  - DEFINITE HIGH-GRADE DYSPLASIA IS NOT IDENTIFIED.  - NEGATIVE FOR MALIGNANCY IN A LIMITED SAMPLING.   Comment:  The patient's prior history of villous adenoma with high-grade dysplasia  involving the rectum is noted.  Biopsy sections from  the rectal polyp  display superficial strips of villous tissue with adenomatous changes.  Definite high-grade dysplasia and malignancy are not identified in the  current specimen.  However, evaluation is limited by the superficial  nature of the sampling.  Clinical and endoscopic correlation is  recommended.   IMPRESSION: 1. 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 2. Amorphous ill-defined hypodense masslike density at the lower rectum which may represent the known rectal cancer, limited visualization. 3. Other ancillary findings as described.     Electronically Signed   By: Ofilia Neas M.D.   On: 09/20/2021 10:41  31st, JAN 2023-  A. OMENTUM, LEFT LATERAL; CT-GUIDED CORE NEEDLE BIOPSY:  - POSITIVE FOR MALIGNANCY.  - METASTATIC ADENOCARCINOMA WITH MUCINOUS FEATURES, COMPATIBLE WITH  COLORECTAL PRIMARY.  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  Accompanied by his sister-in-law.  Roberto Collins 71 y.o.  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  In the interim patient was admitted to the  hospital after cycle #1 of chemotherapy for dehydration nausea vomiting.  Patient received IV fluids.  Patient had ultrasound of the abdomen twice for possible paracentesis-however no fluid noted for tapping.  Patient is currently in a rehab/[facility].  In the interim patient also had evaluation with ENT.  Discussed with Dr. Richardson Landry.  Looks like he was not  Overall he continues to feel poorly.  Continues to have abdominal discomfort/bloating symptoms.  Review of Systems  Constitutional:  Positive for malaise/fatigue and weight loss. Negative for chills, diaphoresis and fever.  HENT:  Negative for nosebleeds and sore throat.   Eyes:  Negative for double vision.  Respiratory:  Positive for shortness of breath. Negative for cough, hemoptysis, sputum production and wheezing.   Cardiovascular:  Positive for leg swelling. Negative for chest pain, palpitations and orthopnea.  Gastrointestinal:  Positive for abdominal pain. Negative for blood in stool, constipation, diarrhea, heartburn, melena, nausea and vomiting.  Genitourinary:  Negative for dysuria, frequency and urgency.  Musculoskeletal:  Positive for back pain. Negative for joint pain.  Skin: Negative.  Negative for itching and rash.  Neurological:  Positive for weakness. Negative for dizziness, tingling, focal weakness and headaches.  Endo/Heme/Allergies:  Bruises/bleeds easily.  Psychiatric/Behavioral:  Negative for depression. The patient is not nervous/anxious and does not have insomnia.     MEDICAL HISTORY:  Past Medical History:  Diagnosis Date   Anemia    Asthma    Back pain    Blood transfusion without reported diagnosis     SURGICAL HISTORY: Past Surgical History:  Procedure Laterality Date   BACK SURGERY  2000   COLONOSCOPY WITH PROPOFOL N/A 04/02/2019   Procedure: COLONOSCOPY WITH PROPOFOL;  Surgeon: Virgel Manifold, MD;  Location: [facility] ENDOSCOPY;  Service: Endoscopy;  Laterality: N/A;    COLONOSCOPY WITH PROPOFOL N/A 04/20/2020   Procedure: COLONOSCOPY WITH PROPOFOL;  Surgeon: Virgel Manifold, MD;  Location: [facility] ENDOSCOPY;  Service: Endoscopy;  Laterality: N/A;   COLONOSCOPY WITH PROPOFOL N/A 07/28/2021   Procedure: COLONOSCOPY WITH PROPOFOL;  Surgeon: Virgel Manifold, MD;  Location: [facility] ENDOSCOPY;  Service: Endoscopy;  Laterality: N/A;   ESOPHAGOGASTRODUODENOSCOPY Left 03/31/2019   Procedure: ESOPHAGOGASTRODUODENOSCOPY (EGD);  Surgeon: Virgel Manifold, MD;  Location: [facility] ENDOSCOPY;  Service: Endoscopy;  Laterality: Left;   GIVENS CAPSULE STUDY N/A 04/02/2019   Procedure: GIVENS CAPSULE STUDY;  Surgeon: Virgel Manifold, MD;  Location: [facility] ENDOSCOPY;  Service: Endoscopy;  Laterality: N/A;   IR IMAGING GUIDED PORT INSERTION  10/27/2021   XI ROBOT ASSISTED TRANSANAL RESECTION N/A 06/25/2019   Procedure: XI ROBOT ASSISTED PARTIAL PROCTECTOMY OF RECTAL MASS USING TAMIS;  Surgeon: [name], MD;  Location: [facility] ORS;  Service: General;  Laterality: N/A;    SOCIAL HISTORY: Social History   Socioeconomic History   Marital status: Single    Spouse name: Not on file   Number of children: 0   Years of education: Not on file   Highest education level: Not on file  Occupational History   Occupation: disabled  Tobacco Use   Smoking status: Every Day    Packs/day: 0.50    Years: 40.00    Pack years: 20.00    Types: Cigarettes    Start date: 08/02/2009    Passive exposure: Past   Smokeless tobacco: Never   Tobacco comments:    [place] smoking cessation program info provided  Vaping Use   Vaping Use: Never used  Substance and Sexual Activity   Alcohol use: Not Currently   Drug use: Never   Sexual activity: Not Currently  Other Topics Concern   Not on file  Social History Narrative   Lives alone; used to roofing/ disabled; no children   Has limited support, mostly from sister   Received disability, has medicare but does not qualify  for Medicaid due to income   Social Determinants of Health   Financial Resource Strain: Low Risk    Difficulty of Paying Living Expenses: Not hard at all  Food Insecurity: No Food Insecurity   Worried About Charity fundraiser in the Last Year: Never true   Arboriculturist in the Last Year: Never true  Transportation Needs: No Transportation Needs   Lack of Transportation (Medical): No   Lack of Transportation (Non-Medical): No  Physical Activity: Insufficiently Active   Days of Exercise per Week: 3 days   Minutes of Exercise per Session: 30 min  Stress: No Stress Concern Present   Feeling of Stress : Not at all  Social Connections: Socially Isolated   Frequency of Communication with Friends and Family: More than three times a week   Frequency of Social Gatherings with Friends and Family: Once a week   Attends Religious Services: Never   Marine scientist or Organizations: No   Attends Music therapist: Never   Marital Status: Never married  Human resources officer Violence: Not At Risk   Fear of Current or Ex-Partner: No   Emotionally Abused: No   Physically Abused: No   Sexually Abused: No    FAMILY HISTORY: Family History  Problem Relation Age of Onset   Dementia Mother    Liver cancer Father     ALLERGIES:  is allergic to bee venom and penicillins.  MEDICATIONS:  Current Outpatient Medications  Medication Sig Dispense Refill   albuterol (VENTOLIN HFA) 108 (90 Base) MCG/ACT inhaler Inhale 2 puffs into the lungs every 6 (six) hours as needed for wheezing or shortness of breath.     aspirin EC 81 MG tablet Take 81 mg by mouth daily. Swallow whole.     aspirin EC 81 MG tablet Take 81 mg by mouth daily. Swallow whole.     cyclobenzaprine (FLEXERIL) 10 MG tablet Take 10 mg by mouth at bedtime.     cyclobenzaprine (FLEXERIL) 10 MG tablet Take 10 mg by mouth 3 (three) times daily as needed for muscle spasms.     doxepin (SINEQUAN) 25 MG  capsule Take 25 mg by mouth at bedtime.     gabapentin (NEURONTIN) 600 MG tablet Take 1,800 mg by mouth at bedtime.      ipratropium-albuterol (DUONEB) 0.5-2.5 (3) MG/3ML SOLN Take 3 mLs by nebulization every 6 (six) hours as needed. 360 mL    ipratropium-albuterol (DUONEB) 0.5-2.5 (3) MG/3ML SOLN Take 3 mLs by nebulization 3 (three) times daily for 7 days. 360 mL    lidocaine-prilocaine (EMLA) cream Apply to affected area once 30 g 3   loratadine (CLARITIN) 10 MG tablet Take 1 tablet (10 mg total) by mouth daily.     melatonin 3 MG TABS tablet Take 3 mg by mouth at bedtime.     morphine (MS CONTIN) 60 MG 12 hr tablet Take 1 tablet (60 mg total) by mouth every 12 (twelve) hours. 12 tablet 0   nicotine (NICODERM CQ - DOSED IN MG/24 HOURS) 21 mg/24hr patch Place 1 patch (21 mg total) onto the skin daily. [place]  patch 0   ondansetron (ZOFRAN) 8 MG tablet Take 1 tablet (8 mg total) by mouth 2 (two) times daily as needed for refractory nausea / vomiting. Start on day 3 after chemotherapy. 30 tablet 1   oxyCODONE-acetaminophen (PERCOCET/ROXICET) 5-325 MG tablet Take 1 tablet by mouth every 4 (four) hours as needed for severe pain. 12 tablet 0   pantoprazole (PROTONIX) 20 MG tablet Take 2 tablets (40 mg total) by mouth daily. 30 tablet 2   prochlorperazine (COMPAZINE) 10 MG tablet Take 1 tablet (10 mg total) by mouth every 6 (six) hours as needed (Nausea or vomiting). 30 tablet 1   tiotropium (SPIRIVA HANDIHALER) 18 MCG inhalation capsule Place 1 capsule (18 mcg total) into inhaler and inhale daily. 30 capsule 0   Cholecalciferol (VITAMIN D) 50 MCG (2000 UT) CAPS Take 2,000 Units by mouth daily. (Patient not taking: Reported on 11/29/2021)     dexamethasone (DECADRON) 4 MG tablet Take 2 tablets (8 mg total) by mouth daily. Start the day after chemotherapy for 2 days. Take with food. (Patient not taking: Reported on 11/10/2021) 30 tablet 1   fluticasone-salmeterol (ADVAIR) 250-50 MCG/ACT AEPB Inhale  1 puff into the lungs in the morning and at bedtime.     senna-docusate (SENOKOT-S) 8.6-50 MG tablet Take 1 tablet by mouth 2 (two) times daily. (Patient not taking: Reported on 11/29/2021)     No current facility-administered medications for this visit.    PHYSICAL EXAMINATION: ECOG PERFORMANCE STATUS: 0 - Asymptomatic  Vitals:   11/29/21 0900  BP: 114/74  Pulse: 95  Resp: 16  SpO2: 95%   Filed Weights   11/29/21 0900  Weight: 126 lb (57.2 kg)   Thin built Caucasian male patient.  Positive for abdominal distention.  Physical Exam HENT:     Head: Normocephalic and atraumatic.     Mouth/Throat:     Pharynx: No oropharyngeal exudate.  Eyes:     Pupils: Pupils are equal, round, and reactive to light.  Cardiovascular:     Rate and Rhythm: Normal rate and regular rhythm.  Pulmonary:     Effort: No respiratory distress.     Breath sounds: No wheezing.     Comments: Decreased air entry bilaterally. Abdominal:     General: Bowel sounds are normal. There is no distension.     Palpations: Abdomen is soft. There is no mass.     Tenderness: There is no abdominal tenderness. There is no guarding or rebound.  Musculoskeletal:        General: No tenderness. Normal range of motion.     Cervical back: Normal range of motion and neck supple.  Skin:    General: Skin is warm.  Neurological:     Mental Status: He is alert and oriented to person, place, and time.  Psychiatric:        Mood and Affect: Affect normal.     LABORATORY DATA:  I have reviewed the data as listed Lab Results  Component Value Date   WBC 8.1 11/29/2021   HGB 9.3 (L) 11/29/2021   HCT 27.8 (L) 11/29/2021   MCV 85.0 11/29/2021   PLT 293 11/29/2021   Recent Labs    02/08/21 1628 05/11/21 1615 11/10/21 1244 11/10/21 1528 11/11/21 0559 11/13/21 0340 11/15/21 0510 11/16/21 0825 11/17/21 0457 11/29/21 0823  NA 136   < > 137  --  136   < > 132* 132*  --  130*  K 4.6   < > 3.4*  --  3.6   < > 3.8 3.7   --  4.2  CL 99   < > 102  --  102   < > 99 99  --  94*  CO2 28   < > 22  --  27   < > 26 26  --  26  GLUCOSE 85   < > 96  --  98   < > 116* 101*  --  113*  BUN 15   < > 20  --  18   < > 19 16  --  16  CREATININE 1.03   < > 0.32*   < > 0.58*   < > 0.54* 0.46* 0.59* 0.61  CALCIUM 9.4   < > 7.9*  --  7.9*   < > 7.8* 7.7*  --  8.1*  GFRNONAA 74   < > >60   < > >60   < > >60 >60 >60 >60  GFRAA 86  --   --   --   --   --   --   --   --   --   PROT 7.2   < > 5.7*  --  5.4*  --   --   --   --  5.9*  ALBUMIN  --    < > 2.6*  --  2.5*  --   --   --   --  2.4*  AST 18   < > 19  --  18  --   --   --   --  23  ALT 17   < > 16  --  15  --   --   --   --  19  ALKPHOS  --    < > 58  --  54  --   --   --   --  79  BILITOT 0.4   < > 0.4  --  0.4  --   --   --   --  0.3   < > = values in this interval not displayed.    RADIOGRAPHIC STUDIES: I have personally reviewed the radiological images as listed and agreed with the findings in the report. DG Abd 1 View  Result Date: 11/10/2021 CLINICAL DATA:  Generalized weakness. History of stage IV gastric cancer. EXAM: ABDOMEN - 1 VIEW COMPARISON:  None. FINDINGS: The bowel gas pattern is normal. No radio-opaque calculi or other significant radiographic abnormality are seen. IMPRESSION: No abnormal bowel dilatation is noted. Electronically Signed   By: Marijo Conception M.D.   On: 11/10/2021 14:56   CT ABDOMEN PELVIS W CONTRAST  Result Date: 11/10/2021 CLINICAL DATA:  Nausea/vomiting weakness EXAM: CT ABDOMEN AND PELVIS WITH CONTRAST TECHNIQUE: Multidetector CT imaging of the abdomen and pelvis was performed using the standard protocol following bolus administration of intravenous contrast. RADIATION DOSE REDUCTION: This exam was performed according to the departmental dose-optimization program which includes automated exposure control, adjustment of the mA and/or kV according to patient size and/or use of iterative reconstruction technique. CONTRAST:  67mL OMNIPAQUE  IOHEXOL 300 MG/ML  SOLN COMPARISON:  CT 09/19/2021 FINDINGS: Lower chest: There is a new moderate size right pleural effusion with adjacent basilar atelectasis. Hepatobiliary: Scalloping of the liver parenchymal contours related to malignant ascites. The gallbladder is unremarkable. Pancreas: Unremarkable. No pancreatic ductal dilatation or surrounding inflammatory changes. Spleen: Normal in size without focal abnormality. Adrenals/Urinary Tract: Adrenal glands are unremarkable. Kidneys are normal, without renal calculi, focal  lesion, or hydronephrosis. The bladder is mildly distended but unremarkable. Stomach/Bowel: No evidence of bowel obstruction. Displacement of the viscera due to viscous/malignant ascites. The appendix appears decompressed. Known colon cancer primary is not well visualized by CT but possibly representative on coronal image 53 where there is focal wall thickening near the rectosigmoid junction. Vascular/Lymphatic: Aortoiliac atherosclerotic calcifications. No AAA. Reproductive: Unremarkable. Other: Cachectic appearance. Large volume abdominopelvic exudate of ascites, increased from prior exam, with diffuse peritoneal carcinomatosis and omental caking. Increased thickening of peritoneal metastases along the right upper quadrant, measuring 1.5 cm in thickness, previously 0.9 cm (series 2, image 22). Increased peritoneal soft tissue density in the pelvis. Musculoskeletal: Prior L4-S1 lumbar fusion. Moderate severe degenerative disc disease at L2-L3. Unchanged chronic right pelvic defect. IMPRESSION: New moderate size right pleural effusion with adjacent basilar atelectasis. Worsening diffuse peritoneal carcinomatosis. Increased, large volume malignant ascites. No evidence of bowel obstruction. Electronically Signed   By: Maurine Simmering M.D.   On: 11/10/2021 16:56   US Venous Img Lower Bilateral (DVT)  Result Date: 11/10/2021 CLINICAL DATA:  Pain EXAM: Bilateral LOWER EXTREMITY VENOUS DOPPLER  ULTRASOUND TECHNIQUE: Gray-scale sonography with compression, as well as color and duplex ultrasound, were performed to evaluate the deep venous system(s) from the level of the common femoral vein through the popliteal and proximal calf veins. COMPARISON:  None. FINDINGS: VENOUS Normal compressibility of the common femoral, superficial femoral, and popliteal veins, as well as the visualized calf veins on both sides. Visualized portions of profunda femoral vein and great saphenous vein unremarkable. No filling defects to suggest DVT on grayscale or color Doppler imaging. Doppler waveforms show normal direction of venous flow, normal respiratory plasticity and response to augmentation. . OTHER None. Limitations: none IMPRESSION: There is no evidence of deep venous thrombosis in both lower extremities. Electronically Signed   By: Elmer Picker M.D.   On: 11/10/2021 17:41   DG Chest Port 1 View  Result Date: 11/10/2021 CLINICAL DATA:  Weakness EXAM: PORTABLE CHEST 1 VIEW COMPARISON:  Chest x-ray dated August 03, 2019 FINDINGS: Cardiac and mediastinal contours are within normal limits. Right chest wall port with tip positioned near the superior cavoatrial junction. New right basilar opacity. Small right pleural effusion. IMPRESSION: 1. New right basilar opacity, concerning for infection or aspiration. 2. Small right pleural effusion. Electronically Signed   By: Yetta Glassman M.D.   On: 11/10/2021 12:39   [place] ASCITES (ABDOMEN LIMITED)  Result Date: 11/17/2021 CLINICAL DATA:  Abdominal distension.  Peritoneal carcinomatosis. EXAM: LIMITED ABDOMEN ULTRASOUND FOR ASCITES TECHNIQUE: Limited ultrasound survey for ascites was performed in all four abdominal quadrants. COMPARISON:  11/14/2021 FINDINGS: Similar small volume abdominal ascites with marked peritoneal carcinomatosis. IMPRESSION: Small volume ascites with marked peritoneal carcinomatosis. Electronically Signed   By: Abigail Miyamoto M.D.   On: 11/17/2021  12:31   [place] ASCITES (ABDOMEN LIMITED)  Result Date: 11/14/2021 CLINICAL DATA:  71 year old male referred for possible paracentesis with known malignancy. EXAM: LIMITED ABDOMEN ULTRASOUND FOR ASCITES TECHNIQUE: Limited ultrasound survey for ascites was performed in all four abdominal quadrants. COMPARISON:  None. FINDINGS: Limited ultrasound demonstrates small volume ascites, with significant peritoneal soft tissue compatible with metastases. IMPRESSION: Small volume ascites by ultrasound, with evidence of significant peritoneal metastases. Electronically Signed   By: Corrie Mckusick D.O.   On: 11/14/2021 16:47   [place] ASCITES (ABDOMEN LIMITED)  Result Date: 11/03/2021 CLINICAL DATA:  Ascites EXAM: LIMITED ABDOMEN ULTRASOUND FOR ASCITES TECHNIQUE: Limited ultrasound survey for ascites was performed in all four  abdominal quadrants. COMPARISON:  None. FINDINGS: Limited sonographic exam of the abdomen was performed. Images demonstrate small volume ascites, primarily in the right upper quadrant, with very little ascites in the lower quadrants for safe image guided paracentesis. No paracentesis performed. IMPRESSION: Small volume ascites.  No paracentesis performed. Electronically Signed   By: Albin Felling M.D.   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) # Omental caking/peritoneal thickening/ascites-s/p biopsy adenocarcinoma of colorectal origin.  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  No discrete hypermetabolic rectal or colonic mass is identified.  Stage IV cancer.  #Poor tolerance to chemotherapy status post cycle 1 of FOLFOX.  Discussed that patient is high risk of ongoing side effects from chemotherapy/hospitalization and risk of death.  Would recommend discontinue chemotherapy in the context of his progressive malignancy/poor tolerance to therapy/declining performance status.  Given the  incurable nature of the disease /poor tolerance of therapy and in general less than 6 months of life expectancy-I introduced hospice philosophy to the patient and family.  Discussed that goal of care should be directed to symptom management rather than treating the underlying disease; and in the process help improve quality of life rather than quantity.  Discussed the options of hospice including the facility versus home.  Given patient's social situation [lives alone at home]-patient not a candidate for hospice at home.  Discussed with Josh Borders; palliative care.  Recommend palliative care evaluation at the facility/and discharge to hospice facility if clinically appropriate.  #Abdominal pain/malignant ascites.-Ultrasound x2 over the last 2 weeks negative for any fluid; peritoneal carcinomatosis causing the pain.  Continue supportive care.  #Right lower chest wall pain/upper quadrant pain-likely secondary to malignancy [[on MS Contin; Percocet; Dr.Marks, [place]; [employer] [facility]] _STABLE.   #  Large area of hypermetabolism involving the right aspect of the tongue without discrete mass seen on CT-clinically no mass seen-s/p ENT evaluation no mass noted.  Discussed with Dr. Richardson Landry   # COPD-chronic STABLE  # IV access: s/p mediport-functional-   SIL- [place]- [phone]/cell; H- [phone]  # DISPOSITION: # de-access # follow up as needed-Dr.B  # 40 minutes face-to-face with the patient discussing the above plan of care; more than 50% of time spent on prognosis/ natural history; counseling and coordination.      All questions were answered. The patient knows to call the clinic with any problems, questions or concerns.    Cammie Sickle, MD 11/29/2021 10:30 AM

## 2021-11-29 NOTE — Progress Notes (Signed)
Nutrition ? ?RD planning to see patient during treatment today.  Patient not receiving treatment today but planning to receive palliative care while on skilled days at rehab then transition to hospice care. No further treatment planned at this time.  ? ?Reconsult RD if needed.  ? ?Youa Deloney B. Zenia Resides, RD, LDN ?Registered Dietitian ?[phone] (mobile) ? ?

## 2021-11-29 NOTE — Progress Notes (Signed)
No treatment today per MD, patient going to palliative care. Port deacccessed. No concerns voiced.  ?

## 2021-11-29 NOTE — Progress Notes (Signed)
[place] at [facility] [phone] [phone]   Name: Roberto Collins Date: 11/29/2021 MRN: [id number]  DOB: 17-Mar-1951  Patient Care Team: [name], PA-C as PCP - General (Family Medicine) Kate Sable, MD as PCP - Cardiology (Cardiology) Virgel Manifold, MD (Inactive) as Consulting Physician (Gastroenterology) Luetta Nutting Sharyn Dross, MD as Referring Physician (Pain Medicine) Cammie Sickle, MD as Consulting Physician (Medical Oncology) Kate Sable, MD as Consulting Physician (Cardiology) [name], MD as Consulting Physician (Colon and Rectal Surgery)    REASON FOR CONSULTATION: Roberto Collins is a 71 y.o. 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  Patient is status post systemic chemotherapy with FOLFOX.  He is referred to palliative care to help address goals and manage ongoing symptoms.   SOCIAL HISTORY:     reports that he has been smoking cigarettes. He started smoking about 12 years ago. He has a 20.00 pack-year smoking history. He has been exposed to tobacco smoke. He has never used smokeless tobacco. He reports that he does not currently use alcohol. He reports that he does not use drugs.  Patient was never married but did have a long-term girlfriend with whom he lived for a while.  He has no children.  He has a brother and an uncle who are involved.  However, patient's caregiver is his sister-in-law who lives about 30 minutes away.  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  ADVANCE DIRECTIVES:  On file  CODE STATUS: DNR/DNI (DNR order signed on 11/29/2021)  PAST MEDICAL HISTORY: Past Medical History:  Diagnosis Date   Anemia    Asthma    Back pain    Blood transfusion without reported diagnosis     PAST SURGICAL HISTORY:  Past  Surgical History:  Procedure Laterality Date   BACK SURGERY  2000   COLONOSCOPY WITH PROPOFOL N/A 04/02/2019   Procedure: COLONOSCOPY WITH PROPOFOL;  Surgeon: Virgel Manifold, MD;  Location: [facility] ENDOSCOPY;  Service: Endoscopy;  Laterality: N/A;   COLONOSCOPY WITH PROPOFOL N/A 04/20/2020   Procedure: COLONOSCOPY WITH PROPOFOL;  Surgeon: Virgel Manifold, MD;  Location: [facility] ENDOSCOPY;  Service: Endoscopy;  Laterality: N/A;   COLONOSCOPY WITH PROPOFOL N/A 07/28/2021   Procedure: COLONOSCOPY WITH PROPOFOL;  Surgeon: Virgel Manifold, MD;  Location: [facility] ENDOSCOPY;  Service: Endoscopy;  Laterality: N/A;   ESOPHAGOGASTRODUODENOSCOPY Left 03/31/2019   Procedure: ESOPHAGOGASTRODUODENOSCOPY (EGD);  Surgeon: Virgel Manifold, MD;  Location: [facility] ENDOSCOPY;  Service: Endoscopy;  Laterality: Left;   GIVENS CAPSULE STUDY N/A 04/02/2019   Procedure: GIVENS CAPSULE STUDY;  Surgeon: Virgel Manifold, MD;  Location: [facility] ENDOSCOPY;  Service: Endoscopy;  Laterality: N/A;   IR IMAGING GUIDED PORT INSERTION  10/27/2021   XI ROBOT ASSISTED TRANSANAL RESECTION N/A 06/25/2019   Procedure: XI ROBOT ASSISTED PARTIAL PROCTECTOMY OF RECTAL MASS USING TAMIS;  Surgeon: [name], MD;  Location: [facility] ORS;  Service: General;  Laterality: N/A;    HEMATOLOGY/ONCOLOGY HISTORY:  Oncology History Overview Note  # JULy 2020- RECTAL MASS-frond-like/villous non-obstructing large mass partially circumferential (involving one half of the lumen circumference; Dr.Tahiliani]; CT C/A/P- NEGATIVE METASTATIC DISEASE; Bx- FRAGMENTS OF TUBULOVILLOUS ADENOMA WITH FOCAL HIGH-GRADE DYSPLASIA; - INVASIVE CARCINOMA NOT IDENTIFIED.   # July 2020- Severe IDA- Hb 3.5 [s/p PRBC]  SURGICAL PATHOLOGY  CASE: [id number]  PATIENT: Roberto Collins  Surgical Pathology Report  Specimen Submitted:  A. Colon polyp x8, transverse; cold snare  B. Colon polyp x2, sigmoid; cold snare  C. Rectum polyp; cbx   Clinical History:  Colon cancer screening Z12.11.  History of colonic  polyps Z86.010.  Colon polyps       DIAGNOSIS:  A. COLON POLYPS X8, TRANSVERSE; COLD SNARE:  - MULTIPLE FRAGMENTS OF TUBULAR ADENOMAS.  - NEGATIVE FOR HIGH-GRADE DYSPLASIA OR MALIGNANCY.   B. COLON POLYPS X2, SIGMOID; COLD SNARE:  - MULTIPLE FRAGMENTS OF TUBULAR ADENOMAS.  - NEGATIVE FOR HIGH-GRADE DYSPLASIA AND MALIGNANCY.   C. RECTAL POLYP; COLD BIOPSY:  - SUPERFICIAL STRIPS OF TUBULOVILLOUS ADENOMA.  - DEFINITE HIGH-GRADE DYSPLASIA IS NOT IDENTIFIED.  - NEGATIVE FOR MALIGNANCY IN A LIMITED SAMPLING.   Comment:  The patient's prior history of villous adenoma with high-grade dysplasia  involving the rectum is noted.  Biopsy sections from the rectal polyp  display superficial strips of villous tissue with adenomatous changes.  Definite high-grade dysplasia and malignancy are not identified in the  current specimen.  However, evaluation is limited by the superficial  nature of the sampling.  Clinical and endoscopic correlation is  recommended.   IMPRESSION: 1. 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 2. Amorphous ill-defined hypodense masslike density at the lower rectum which may represent the known rectal cancer, limited visualization. 3. Other ancillary findings as described.     Electronically Signed   By: Ofilia Neas M.D.   On: 09/20/2021 10:41  31st, JAN 2023-  A. OMENTUM, LEFT LATERAL; CT-GUIDED CORE NEEDLE BIOPSY:  - POSITIVE FOR MALIGNANCY.  - METASTATIC ADENOCARCINOMA WITH MUCINOUS FEATURES, COMPATIBLE WITH  COLORECTAL PRIMARY.  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)  04/10/2019 Initial Diagnosis   Rectal  malignant neoplasm ([place])   11/01/2021 Cancer Staging   Staging form: Colon and Rectum, AJCC 8th Edition - Clinical: Stage IVC (cTX, cNX, pM1c) - Signed by Cammie Sickle, MD on 11/01/2021   Peritoneal carcinomatosis ([place])  10/03/2021 Initial Diagnosis   Peritoneal carcinomatosis ([place])   11/01/2021 -  Chemotherapy   Patient is on Treatment Plan : COLORECTAL FOLFOX q14d       ALLERGIES:  is allergic to bee venom and penicillins.  MEDICATIONS:  Current Outpatient Medications  Medication Sig Dispense Refill   albuterol (VENTOLIN HFA) 108 (90 Base) MCG/ACT inhaler Inhale 2 puffs into the lungs every 6 (six) hours as needed for wheezing or shortness of breath.     aspirin EC 81 MG tablet Take 81 mg by mouth daily. Swallow whole.     aspirin EC 81 MG tablet Take 81 mg by mouth daily. Swallow whole.     Cholecalciferol (VITAMIN D) 50 MCG (2000 UT) CAPS Take 2,000 Units by mouth daily. (Patient not taking: Reported on 11/29/2021)     cyclobenzaprine (FLEXERIL) 10 MG tablet Take 10 mg by mouth at bedtime.     cyclobenzaprine (FLEXERIL) 10 MG tablet Take 10 mg by mouth 3 (three) times daily as needed for muscle spasms.     dexamethasone (DECADRON) 4 MG tablet Take 2 tablets (8 mg total) by mouth daily. Start the day after chemotherapy for 2 days. Take with food. (Patient not taking: Reported on 11/10/2021) 30 tablet 1   doxepin (SINEQUAN) 25  MG capsule Take 25 mg by mouth at bedtime.     fluticasone-salmeterol (ADVAIR) 250-50 MCG/ACT AEPB Inhale 1 puff into the lungs in the morning and at bedtime.     gabapentin (NEURONTIN) 600 MG tablet Take 1,800 mg by mouth at bedtime.      ipratropium-albuterol (DUONEB) 0.5-2.5 (3) MG/3ML SOLN Take 3 mLs by nebulization every 6 (six) hours as needed. 360 mL    ipratropium-albuterol (DUONEB) 0.5-2.5 (3) MG/3ML SOLN Take 3 mLs by nebulization 3 (three) times daily for 7 days. 360 mL    lidocaine-prilocaine (EMLA) cream Apply to affected area once 30 g 3    loratadine (CLARITIN) 10 MG tablet Take 1 tablet (10 mg total) by mouth daily.     melatonin 3 MG TABS tablet Take 3 mg by mouth at bedtime.     morphine (MS CONTIN) 60 MG 12 hr tablet Take 1 tablet (60 mg total) by mouth every 12 (twelve) hours. 12 tablet 0   nicotine (NICODERM CQ - DOSED IN MG/24 HOURS) 21 mg/24hr patch Place 1 patch (21 mg total) onto the skin daily. 28 patch 0   ondansetron (ZOFRAN) 8 MG tablet Take 1 tablet (8 mg total) by mouth 2 (two) times daily as needed for refractory nausea / vomiting. Start on day 3 after chemotherapy. 30 tablet 1   oxyCODONE-acetaminophen (PERCOCET/ROXICET) 5-325 MG tablet Take 1 tablet by mouth every 4 (four) hours as needed for severe pain. 12 tablet 0   pantoprazole (PROTONIX) 20 MG tablet Take 2 tablets (40 mg total) by mouth daily. 30 tablet 2   prochlorperazine (COMPAZINE) 10 MG tablet Take 1 tablet (10 mg total) by mouth every 6 (six) hours as needed (Nausea or vomiting). 30 tablet 1   senna-docusate (SENOKOT-S) 8.6-50 MG tablet Take 1 tablet by mouth 2 (two) times daily. (Patient not taking: Reported on 11/29/2021)     tiotropium (SPIRIVA HANDIHALER) 18 MCG inhalation capsule Place 1 capsule (18 mcg total) into inhaler and inhale daily. 30 capsule 0   No current facility-administered medications for this visit.    VITAL SIGNS: There were no vitals taken for this visit. There were no vitals filed for this visit.  Estimated body mass index is 18.08 kg/m as calculated from the following:   Height as of 11/10/21: $RemoveBef'5\' 10"'TmPZxFcmFR$  (1.778 m).   Weight as of an earlier encounter on 11/29/21: 126 lb (57.2 kg).  LABS: CBC:    Component Value Date/Time   WBC 8.1 11/29/2021 0823   HGB 9.3 (L) 11/29/2021 0823   HCT 27.8 (L) 11/29/2021 0823   PLT 293 11/29/2021 0823   MCV 85.0 11/29/2021 0823   NEUTROABS 6.4 11/29/2021 0823   LYMPHSABS 0.9 11/29/2021 0823   MONOABS 0.7 11/29/2021 0823   EOSABS 0.0 11/29/2021 0823   BASOSABS 0.0 11/29/2021 0823    Comprehensive Metabolic Panel:    Component Value Date/Time   NA 130 (L) 11/29/2021 0823   K 4.2 11/29/2021 0823   CL 94 (L) 11/29/2021 0823   CO2 26 11/29/2021 0823   BUN 16 11/29/2021 0823   CREATININE 0.61 11/29/2021 0823   CREATININE 0.78 10/24/2021 1427   GLUCOSE 113 (H) 11/29/2021 0823   CALCIUM 8.1 (L) 11/29/2021 0823   AST 23 11/29/2021 0823   ALT 19 11/29/2021 0823   ALKPHOS 79 11/29/2021 0823   BILITOT 0.3 11/29/2021 0823   PROT 5.9 (L) 11/29/2021 0823   ALBUMIN 2.4 (L) 11/29/2021 0823    RADIOGRAPHIC STUDIES: DG Abd 1 View  Result Date: 11/10/2021 CLINICAL DATA:  Generalized weakness. History of stage IV gastric cancer. EXAM: ABDOMEN - 1 VIEW COMPARISON:  None. FINDINGS: The bowel gas pattern is normal. No radio-opaque calculi or other significant radiographic abnormality are seen. IMPRESSION: No abnormal bowel dilatation is noted. Electronically Signed   By: Marijo Conception M.D.   On: 11/10/2021 14:56   CT ABDOMEN PELVIS W CONTRAST  Result Date: 11/10/2021 CLINICAL DATA:  Nausea/vomiting weakness EXAM: CT ABDOMEN AND PELVIS WITH CONTRAST TECHNIQUE: Multidetector CT imaging of the abdomen and pelvis was performed using the standard protocol following bolus administration of intravenous contrast. RADIATION DOSE REDUCTION: This exam was performed according to the departmental dose-optimization program which includes automated exposure control, adjustment of the mA and/or kV according to patient size and/or use of iterative reconstruction technique. CONTRAST:  8mL OMNIPAQUE IOHEXOL 300 MG/ML  SOLN COMPARISON:  CT 09/19/2021 FINDINGS: Lower chest: There is a new moderate size right pleural effusion with adjacent basilar atelectasis. Hepatobiliary: Scalloping of the liver parenchymal contours related to malignant ascites. The gallbladder is unremarkable. Pancreas: Unremarkable. No pancreatic ductal dilatation or surrounding inflammatory changes. Spleen: Normal in size without  focal abnormality. Adrenals/Urinary Tract: Adrenal glands are unremarkable. Kidneys are normal, without renal calculi, focal lesion, or hydronephrosis. The bladder is mildly distended but unremarkable. Stomach/Bowel: No evidence of bowel obstruction. Displacement of the viscera due to viscous/malignant ascites. The appendix appears decompressed. Known colon cancer primary is not well visualized by CT but possibly representative on coronal image 53 where there is focal wall thickening near the rectosigmoid junction. Vascular/Lymphatic: Aortoiliac atherosclerotic calcifications. No AAA. Reproductive: Unremarkable. Other: Cachectic appearance. Large volume abdominopelvic exudate of ascites, increased from prior exam, with diffuse peritoneal carcinomatosis and omental caking. Increased thickening of peritoneal metastases along the right upper quadrant, measuring 1.5 cm in thickness, previously 0.9 cm (series 2, image 22). Increased peritoneal soft tissue density in the pelvis. Musculoskeletal: Prior L4-S1 lumbar fusion. Moderate severe degenerative disc disease at L2-L3. Unchanged chronic right pelvic defect. IMPRESSION: New moderate size right pleural effusion with adjacent basilar atelectasis. Worsening diffuse peritoneal carcinomatosis. Increased, large volume malignant ascites. No evidence of bowel obstruction. Electronically Signed   By: Maurine Simmering M.D.   On: 11/10/2021 16:56   US Venous Img Lower Bilateral (DVT)  Result Date: 11/10/2021 CLINICAL DATA:  Pain EXAM: Bilateral LOWER EXTREMITY VENOUS DOPPLER ULTRASOUND TECHNIQUE: Gray-scale sonography with compression, as well as color and duplex ultrasound, were performed to evaluate the deep venous system(s) from the level of the common femoral vein through the popliteal and proximal calf veins. COMPARISON:  None. FINDINGS: VENOUS Normal compressibility of the common femoral, superficial femoral, and popliteal veins, as well as the visualized calf veins on both  sides. Visualized portions of profunda femoral vein and great saphenous vein unremarkable. No filling defects to suggest DVT on grayscale or color Doppler imaging. Doppler waveforms show normal direction of venous flow, normal respiratory plasticity and response to augmentation. . OTHER None. Limitations: none IMPRESSION: There is no evidence of deep venous thrombosis in both lower extremities. Electronically Signed   By: Elmer Picker M.D.   On: 11/10/2021 17:41   DG Chest Port 1 View  Result Date: 11/10/2021 CLINICAL DATA:  Weakness EXAM: PORTABLE CHEST 1 VIEW COMPARISON:  Chest x-ray dated August 03, 2019 FINDINGS: Cardiac and mediastinal contours are within normal limits. Right chest wall port with tip positioned near the superior cavoatrial junction. New right basilar opacity. Small right pleural effusion. IMPRESSION: 1. New right basilar  opacity, concerning for infection or aspiration. 2. Small right pleural effusion. Electronically Signed   By: Yetta Glassman M.D.   On: 11/10/2021 12:39   [place] ASCITES (ABDOMEN LIMITED)  Result Date: 11/17/2021 CLINICAL DATA:  Abdominal distension.  Peritoneal carcinomatosis. EXAM: LIMITED ABDOMEN ULTRASOUND FOR ASCITES TECHNIQUE: Limited ultrasound survey for ascites was performed in all four abdominal quadrants. COMPARISON:  11/14/2021 FINDINGS: Similar small volume abdominal ascites with marked peritoneal carcinomatosis. IMPRESSION: Small volume ascites with marked peritoneal carcinomatosis. Electronically Signed   By: Abigail Miyamoto M.D.   On: 11/17/2021 12:31   [place] ASCITES (ABDOMEN LIMITED)  Result Date: 11/14/2021 CLINICAL DATA:  71 year old male referred for possible paracentesis with known malignancy. EXAM: LIMITED ABDOMEN ULTRASOUND FOR ASCITES TECHNIQUE: Limited ultrasound survey for ascites was performed in all four abdominal quadrants. COMPARISON:  None. FINDINGS: Limited ultrasound demonstrates small volume ascites, with significant peritoneal soft  tissue compatible with metastases. IMPRESSION: Small volume ascites by ultrasound, with evidence of significant peritoneal metastases. Electronically Signed   By: Corrie Mckusick D.O.   On: 11/14/2021 16:47   [place] ASCITES (ABDOMEN LIMITED)  Result Date: 11/03/2021 CLINICAL DATA:  Ascites EXAM: LIMITED ABDOMEN ULTRASOUND FOR ASCITES TECHNIQUE: Limited ultrasound survey for ascites was performed in all four abdominal quadrants. COMPARISON:  None. FINDINGS: Limited sonographic exam of the abdomen was performed. Images demonstrate small volume ascites, primarily in the right upper quadrant, with very little ascites in the lower quadrants for safe image guided paracentesis. No paracentesis performed. IMPRESSION: Small volume ascites.  No paracentesis performed. Electronically Signed   By: Albin Felling M.D.   On: 11/03/2021 15:16    PERFORMANCE STATUS (ECOG) : 3 - Symptomatic, >50% confined to bed  Review of Systems Unless otherwise noted, a complete review of systems is negative.  Physical Exam General: NAD Pulmonary: Unlabored Extremities: no edema, no joint deformities Skin: no rashes Neurological: Weakness but otherwise nonfocal  IMPRESSION: Patient received 1 cycle of FOLFOX chemotherapy on 11/01/2021 and demonstrates significant decline resulting in hospitalization from 11/10/2021 to 11/17/2018 with progressive weakness and dehydration/failure to thrive.  Patient has been subsequently transferred to rehab.[facility]  He remains profoundly weak and would benefit from continued rehab.  He does not have significant social support and returning home alone does not seem to be safe at this point.  There is no plan to continue chemotherapy as doing such would likely cause further decline.  Dr. Rogue Bussing has recommended best supportive care/hospice involvement.  I met with patient and sister-in-law.  I explained that hospice would not be able to follow while patient is receiving rehab/Medicare skilled days.  We will  instead consult palliative care with plan for future hospice involvement.  Discussed CODE STATUS.  Patient verbalized clearly and repeatedly that he would not want to be resuscitated or have his life prolonged artificially machines.  He is in agreement with DNR/DNI.  PLAN: -Best supportive care -Recommend continued rehab -We will consult palliative care to follow at facility -Recommend future hospice involvement once off skilled days -In the setting of decline where prognosis is limited to days to weeks, would then recommend transition to hospice IPU -DNR/DNI -Follow-up telephone visit 1 month  Case and plan discussed with Dr. Rogue Bussing    Patient expressed understanding and was in agreement with this plan. He also understands that He can call the clinic at any time with any questions, concerns, or complaints.     Time Total: 20 minutes  Visit consisted of counseling and education dealing with the complex  and emotionally intense issues of symptom management and palliative care in the setting of serious and potentially life-threatening illness.Greater than 50%  of this time was spent counseling and coordinating care related to the above assessment and plan.  Signed by: Altha Harm, PhD, NP-C

## 2021-11-30 ENCOUNTER — Non-Acute Institutional Stay: Payer: Medicare PPO | Admitting: *Deleted

## 2021-11-30 ENCOUNTER — Non-Acute Institutional Stay: Payer: Medicare PPO | Admitting: Hospice

## 2021-11-30 DIAGNOSIS — R531 Weakness: Secondary | ICD-10-CM | POA: Diagnosis not present

## 2021-11-30 DIAGNOSIS — C20 Malignant neoplasm of rectum: Secondary | ICD-10-CM | POA: Diagnosis not present

## 2021-11-30 DIAGNOSIS — Z515 Encounter for palliative care: Secondary | ICD-10-CM

## 2021-11-30 DIAGNOSIS — J449 Chronic obstructive pulmonary disease, unspecified: Secondary | ICD-10-CM | POA: Diagnosis not present

## 2021-11-30 DIAGNOSIS — G8929 Other chronic pain: Secondary | ICD-10-CM

## 2021-11-30 DIAGNOSIS — M545 Low back pain, unspecified: Secondary | ICD-10-CM | POA: Diagnosis not present

## 2021-11-30 DIAGNOSIS — F17210 Nicotine dependence, cigarettes, uncomplicated: Secondary | ICD-10-CM | POA: Diagnosis not present

## 2021-11-30 LAB — CEA: CEA: 1806 ng/mL — ABNORMAL HIGH (ref 0.0–4.7)

## 2021-11-30 NOTE — Progress Notes (Signed)
Roberto Collins Consult Note Telephone: [phone]  Fax: [phone]  PATIENT NAME: Roberto Collins [place] [address] [phone] (home)  DOB: Nov 19, 1950 MRN: [id number]  PRIMARY CARE PROVIDER:    Laurell Collins  [address] [place] [address] [phone]  REFERRING PROVIDER:   [name], PA-C [address] [place],  Roberto Collins [address] [phone]  RESPONSIBLE PARTY:   Self Roberto Collins is the Roberto Collins     Name Relation Home Work Mobile   [place] Sister [phone]  [phone]       TELEHEALTH VISIT STATEMENT Due to the COVID-19 crisis, this visit was done via telemedicine from my office and it was initiated and consent by this patient and or family.  I connected with patient OR PROXY by a telephone/video  and verified that I am speaking with the correct person. I discussed the limitations of evaluation and management by telemedicine. The patient expressed understanding and agreed to proceed. Palliative Care was asked to follow this patient to address advance care planning, complex medical decision making and goals of care clarification. This is the initial visit.  NP called Roberto Collins and left message with her Grandson with callback number    ASSESSMENT AND / RECOMMENDATIONS:   Advance Care Planning: Our advance care planning conversation included a discussion about:    The value and importance of advance care planning  Difference between Hospice and Palliative care Exploration of goals of care in the event of a sudden injury or illness  Identification and preparation of a healthcare agent  Review and updating or creation of an  advance directive document . Decision not to resuscitate or to de-escalate disease focused treatments due to poor prognosis.  CODE STATUS: Patient is a DO NOT RESUSCITATE  Goals of Care: Goals include to maximize quality of  life and symptom management  I spent  16 minutes providing this initial consultation. More than 50% of the time in this consultation was spent on counseling patient and coordinating communication. --------------------------------------------------------------------------------------------------------------------------------------  Symptom Management/Plan: Rectal Cancer: Newly diagnosed this year.  No more on chemo/radiation.  Encouraged follow-up with oncology as planned.  Continue ongoing supportive care.   COPD: Encouraged slow deep breathing, avoid triggers.  Continue with breathing treatments as ordered.  Pulmonologist consult as needed. Protein caloric malnutrition: Severe.  Current weight is 122 pounds / 5 feet 10 inches. Provide enough time and assistance during meals. Offer 4-6 small meals. Initiate med pass and Ensure, ST consult.  Routine CBC CMP Weakness: PT/OT is ongoing for strengthening and gait training. Low back pain: Managed with morphine, oxycodone, gabapentin.  Initiate daily MiraLAX for bowel regimen. Follow up: Palliative care will continue to follow for complex medical decision making, advance care planning, and clarification of goals. Return 6 weeks or prn. Encouraged to call provider sooner with any concerns.   Family /Caregiver/Community Supports: Patient is in SNF for ongoing care  HOSPICE ELIGIBILITY/DIAGNOSIS: TBD  Chief Complaint: Initial Palliative care visit  HISTORY OF PRESENT ILLNESS:  [name] is a 71 y.o. year old male  with multiple morbidities requiring close monitoring and with high risk of complications and  mortality: Rectal cancer, severe protein caloric malnutrition, chronic low back pain, COPD, depression.Marland Kitchen  History obtained from review of EMR, discussion with primary team, caregiver, family and/or Roberto Collins.  Review and summarization of Epic records shows history from other than patient. Rest of 10 point ROS asked and negative.  I reviewed as  needed, available labs, patient records, imaging, studies and related documents from the EMR.  Recent Labs  Lab 11/29/21 0823  WBC 8.1  HGB 9.3*  HCT 27.8*  PLT 293  MCV 85.0   Recent Labs  Lab 11/29/21 0823  NA 130*  K 4.2  CL 94*  CO2 26  BUN 16  CREATININE 0.61  GLUCOSE 113*   Latest GFR by Cockcroft Gault (not valid in AKI or ESRD) Estimated Creatinine Clearance: 70.5 mL/min (by C-G formula based on SCr of 0.61 mg/dL). Recent Labs  Lab 11/29/21 0823  AST 23  ALT 19  ALKPHOS 79    ROS General: NAD EYES: denies vision changes ENMT: denies dysphagia Cardiovascular: denies chest pain/discomfort Pulmonary: denies cough, denies SOB currently, endorses shortness of breath on exertion Abdomen: endorses good appetite, denies constipation/diarrhea GU: denies dysuria, urinary frequency MSK:  endorses weakness,  no falls reported Skin: denies rashes or wounds Neurological: denies pain, denies insomnia Heme/lymph/immuno: denies bruises, abnormal bleeding   PAST MEDICAL HISTORY:  Active Ambulatory Problems    Diagnosis Date Noted   Polyp of colon    Colon neoplasm    Diverticulosis of large intestine without diverticulitis    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Roberto Collins) 04/10/2019   Iron deficiency anemia due to chronic blood loss 04/10/2019   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 06/25/2019 06/25/2019   Chronic insomnia 04/04/2012   Chronic low back pain 04/04/2012   Depression 04/04/2012   History of colonic polyps    Candidate for statin therapy due to risk of future cardiovascular event 05/11/2021   Coronary artery disease involving native heart without angina pectoris 05/11/2021   Atherosclerosis 05/11/2021   Pulmonary emphysema (Roberto Collins) 05/11/2021   Current every day smoker 05/11/2021   Smoking greater than 30 pack years 05/11/2021   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   Weakness 11/10/2021   Pneumonia 11/10/2021   Palliative care  encounter    Protein-calorie malnutrition, severe 11/14/2021   Chronic obstructive pulmonary disease (HCC)    Ascites, malignant    Resolved Ambulatory Problems    Diagnosis Date Noted   Anemia 03/30/2019   Past Medical History:  Diagnosis Date   Asthma    Back pain    Blood transfusion without reported diagnosis     SOCIAL HX:  Social History   Tobacco Use   Smoking status: Every Day    Packs/day: 0.50    Years: 40.00    Pack years: 20.00    Types: Cigarettes    Start date: 08/02/2009    Passive exposure: Past   Smokeless tobacco: Never   Tobacco comments:    [place] smoking cessation program info provided  Substance Use Topics   Alcohol use: Not Currently     FAMILY HX:  Family History  Problem Relation Age of Onset   Dementia Mother    Liver cancer Father       ALLERGIES:  Allergies  Allergen Reactions   Bee Venom Anaphylaxis   Penicillins Anaphylaxis and Swelling    Did it involve swelling of the face/tongue/throat, SOB, or low BP? Yes Did it involve sudden or severe rash/hives, skin peeling, or any reaction on the inside of your mouth or nose? No Did you need to seek medical attention at a hospital or doctor's office? Yes When did it last happen?  Many years ago If all above answers are "NO", may proceed with cephalosporin use.       PERTINENT MEDICATIONS:  Outpatient Encounter Medications as of 11/30/2021  Medication Sig   albuterol (VENTOLIN HFA) 108 (90 Base) MCG/ACT inhaler Inhale 2 puffs into the lungs every 6 (six) hours as needed for wheezing or shortness of breath.   aspirin EC 81 MG tablet Take 81 mg by mouth daily. Swallow whole.   aspirin EC 81 MG tablet Take 81 mg by mouth daily. Swallow whole.   Cholecalciferol (VITAMIN D) 50 MCG (2000 UT) CAPS Take 2,000 Units by mouth daily. (Patient not taking: Reported on 11/29/2021)   cyclobenzaprine (FLEXERIL) 10 MG tablet Take 10 mg by mouth at bedtime.   cyclobenzaprine (FLEXERIL) 10 MG tablet  Take 10 mg by mouth 3 (three) times daily as needed for muscle spasms.   dexamethasone (DECADRON) 4 MG tablet Take 2 tablets (8 mg total) by mouth daily. Start the day after chemotherapy for 2 days. Take with food. (Patient not taking: Reported on 11/10/2021)   doxepin (SINEQUAN) 25 MG capsule Take 25 mg by mouth at bedtime.   fluticasone-salmeterol (ADVAIR) 250-50 MCG/ACT AEPB Inhale 1 puff into the lungs in the morning and at bedtime.   gabapentin (NEURONTIN) 600 MG tablet Take 1,800 mg by mouth at bedtime.    ipratropium-albuterol (DUONEB) 0.5-2.5 (3) MG/3ML SOLN Take 3 mLs by nebulization every 6 (six) hours as needed.   ipratropium-albuterol (DUONEB) 0.5-2.5 (3) MG/3ML SOLN Take 3 mLs by nebulization 3 (three) times daily for 7 days.   lidocaine-prilocaine (EMLA) cream Apply to affected area once   loratadine (CLARITIN) 10 MG tablet Take 1 tablet (10 mg total) by mouth daily.   melatonin 3 MG TABS tablet Take 3 mg by mouth at bedtime.   morphine (MS CONTIN) 60 MG 12 hr tablet Take 1 tablet (60 mg total) by mouth every 12 (twelve) hours.   nicotine (NICODERM CQ - DOSED IN MG/24 HOURS) 21 mg/24hr patch Place 1 patch (21 mg total) onto the skin daily.   ondansetron (ZOFRAN) 8 MG tablet Take 1 tablet (8 mg total) by mouth 2 (two) times daily as needed for refractory nausea / vomiting. Start on day 3 after chemotherapy.   oxyCODONE-acetaminophen (PERCOCET/ROXICET) 5-325 MG tablet Take 1 tablet by mouth every 4 (four) hours as needed for severe pain.   pantoprazole (PROTONIX) 20 MG tablet Take 2 tablets (40 mg total) by mouth daily.   prochlorperazine (COMPAZINE) 10 MG tablet Take 1 tablet (10 mg total) by mouth every 6 (six) hours as needed (Nausea or vomiting).   senna-docusate (SENOKOT-S) 8.6-50 MG tablet Take 1 tablet by mouth 2 (two) times daily. (Patient not taking: Reported on 11/29/2021)   tiotropium (SPIRIVA HANDIHALER) 18 MCG inhalation capsule Place 1 capsule (18 mcg total) into inhaler and  inhale daily.   No facility-administered encounter medications on file as of 11/30/2021.     Thank you for the opportunity to participate in the care of Roberto Collins.  The palliative care team will continue to follow. Please call our office at [phone] if we can be of additional assistance.   Note: Portions of this note were generated with Lobbyist. Dictation errors may occur despite best attempts at proofreading.  Teodoro Spray, NP

## 2021-11-30 NOTE — Progress Notes (Signed)
[facility] PALLIATIVE CARE RN NOTE ? ?PATIENT NAME: Roberto Collins ?DOB: [birth year] ?MRN: [id number] ? ?PRIMARY CARE PROVIDER: [name], PA-C ? ?RESPONSIBLE PARTY: [name] (sister) ?Acct ID - Guarantor Home Phone Work Phone Relationship Acct Type  ?[id number] [name], [name] [phone]  Self P/F  ?   [place], [place], [place] [address]  ? ? ?RN new palliative care referral face to face visit made with patient at [facility] today. Met with patient in his room. Conferenced in palliative NP, Roberto Collins via telehealth. See provider note for complete details regarding visit. ? ? ?[facility] [place], RN BSN ? ?

## 2021-11-30 NOTE — Addendum Note (Signed)
Addended by: Laverda Sorenson I on: 11/30/2021 03:50 PM ? ? Modules accepted: Level of Service ? ?

## 2021-12-01 ENCOUNTER — Telehealth: Payer: Self-pay | Admitting: *Deleted

## 2021-12-01 ENCOUNTER — Inpatient Hospital Stay: Payer: Medicare PPO

## 2021-12-01 NOTE — Telephone Encounter (Signed)
Roberto Collins, care giver of patient, called and left vm to cnl Roberto Collins apt today for d/c pump since he did not get chemo on Wednesday. Call returned to caregiver by RN. ? ?I called Roberto Collins back. I apologized for the oversight of the d/c pump apt not being cnl prior to today's apt. D/c pump apt cnl. In addition, Roberto Collins thought that the virtual apt with Roberto Pew, NP was today. I also explained to Roberto Collins that the virtual apt with Roberto Collins is not scheduled until April 3rd. I asked her if there was anything particular that she needed to discuss with Roberto Collins at this time. She stated, "no, I just wanted to make sure the apt with Roberto Collins was not today." Family does not have any additional concerns at this time to be addressed with palliative care. She understands that she can call back if the family/patient needs a sooner mychart apt with Roberto Pew, NP. ? ?She thanked me for returning her call. ?

## 2021-12-04 DIAGNOSIS — F5105 Insomnia due to other mental disorder: Secondary | ICD-10-CM | POA: Diagnosis not present

## 2021-12-04 DIAGNOSIS — F32A Depression, unspecified: Secondary | ICD-10-CM | POA: Diagnosis not present

## 2021-12-04 DIAGNOSIS — F411 Generalized anxiety disorder: Secondary | ICD-10-CM | POA: Diagnosis not present

## 2021-12-04 DIAGNOSIS — F331 Major depressive disorder, recurrent, moderate: Secondary | ICD-10-CM | POA: Diagnosis not present

## 2021-12-05 ENCOUNTER — Non-Acute Institutional Stay: Payer: Medicare PPO | Admitting: Hospice

## 2021-12-05 ENCOUNTER — Other Ambulatory Visit: Payer: Self-pay

## 2021-12-05 DIAGNOSIS — R180 Malignant ascites: Secondary | ICD-10-CM | POA: Diagnosis not present

## 2021-12-05 DIAGNOSIS — C20 Malignant neoplasm of rectum: Secondary | ICD-10-CM

## 2021-12-05 DIAGNOSIS — C786 Secondary malignant neoplasm of retroperitoneum and peritoneum: Secondary | ICD-10-CM | POA: Diagnosis not present

## 2021-12-05 DIAGNOSIS — R531 Weakness: Secondary | ICD-10-CM

## 2021-12-05 DIAGNOSIS — J449 Chronic obstructive pulmonary disease, unspecified: Secondary | ICD-10-CM

## 2021-12-05 DIAGNOSIS — M545 Low back pain, unspecified: Secondary | ICD-10-CM | POA: Diagnosis not present

## 2021-12-05 DIAGNOSIS — G8929 Other chronic pain: Secondary | ICD-10-CM | POA: Diagnosis not present

## 2021-12-05 DIAGNOSIS — Z515 Encounter for palliative care: Secondary | ICD-10-CM

## 2021-12-05 DIAGNOSIS — L89890 Pressure ulcer of other site, unstageable: Secondary | ICD-10-CM | POA: Diagnosis not present

## 2021-12-05 DIAGNOSIS — E43 Unspecified severe protein-calorie malnutrition: Secondary | ICD-10-CM | POA: Diagnosis not present

## 2021-12-05 NOTE — Progress Notes (Signed)
[facility] Pall[facility] Consult Note Telephone: [phone]  Fax: [phone]  PATIENT NAME: Roberto Collins [place] [address] [phone] (home)  DOB: [birth year] MRN: [id number]  PRIMARY CARE PROVIDER:    Laurell Collins  [address] [place] [address] [phone]  REFERRING PROVIDER:   [name], PA-C [address] [place],  [place] [address] [phone]  RESPONSIBLE PARTY:   Self/Roberto Collins is the [place]     Name Relation Home Work Mobile   [place] Sister [phone]  [phone]        Palliative Care was asked to follow this patient to address advance care planning, complex medical decision making and goals of care clarification. Roberto Collins is with patient during visit.     ASSESSMENT AND / RECOMMENDATIONS:   CODE STATUS: Patient is a DO NOT RESUSCITATE  Goals of Care: Goals include to maximize quality of life and symptom management. Discussion on patient's debility and poor prognosis related to his multiple morbidities; NP discussed benefits of hospice service. Patient and Roberto Collins are open to hospice service in the future, not ready for hospice service at this time.  Roberto Collins said that patient should continue to stay at facility. Meeting discussion between NP and facility social workers; and [place].  LTC application  for room and board has been filed with Medicaid and awaiting decision on the application. Roberto Collins satisfied with finding.  Symptom Management/Plan: Rectal Cancer: Newly diagnosed this year 10/16/21.  No more  chemo/radiation. Roberto Collins reports no plan for chemo because patient is so deconditioned and will not tolerate at this time.  Encouraged follow-up with oncology as planned.  Continue ongoing supportive care.    COPD: Encouraged slow deep breathing, avoid triggers.  Continue with breathing treatments as ordered. Patient continues  to smoke 6 cigarettes a day. Education on smoking cessation and the benefits. Pulmonologist consult as needed.  Protein caloric malnutrition: Severe.  Current weight is up 124.5 Ib up from 122 pounds / 5 feet 10 inches 11/28/21. Provide enough time and assistance during meals. Offer 4-6 small meals. Initiate med pass and Ensure, ST consult.  Routine CBC CMP  Weakness: Debilitating weakness related to Rectal CA and COPD. PT/OT is ongoing for strengthening and gait training. Patient is ambulatory with assistive device and supervision.   Low back pain: Managed with morphine, oxycodone, gabapentin.  Initiate daily MiraLAX for bowel regimen.  Follow up: Palliative care will continue to follow for complex medical decision making, advance care planning, and clarification of goals. Return 6 weeks or prn. Encouraged to call provider sooner with any concerns.   Family /Caregiver/Community Supports: Patient is in SNF for ongoing care  HOSPICE ELIGIBILITY/DIAGNOSIS: TBD  Chief Complaint: Follow up visit  HISTORY OF PRESENT ILLNESS:  Roberto Collins is a 71 y.o. year old male  with multiple morbidities requiring close monitoring and with high risk of complications and  mortality: Rectal cancer, severe protein caloric malnutrition, chronic low back pain, COPD, depression.  Patient reports low back pain is well managed with current pain regimen, endorses shortness of breath on mild-to-moderate exertion; in no acute respite distress.  Oxygen supplementation and breathing treatments are helpful. History obtained from review of EMR, discussion with primary team, caregiver, family and/or Roberto Collins.  Review and summarization of Epic records shows history from other than patient. Rest of 10 point ROS asked and negative.  I reviewed as needed, available labs, patient records, imaging, studies and related documents  from the EMR.  Recent Labs  Lab 11/29/21 0823  WBC 8.1  HGB 9.3*  HCT 27.8*  PLT 293  MCV 85.0    Recent Labs  Lab 11/29/21 0823  NA 130*  K 4.2  CL 94*  CO2 26  BUN 16  CREATININE 0.61  GLUCOSE 113*   Latest GFR by Cockcroft Gault (not valid in AKI or ESRD) Estimated Creatinine Clearance: 70.5 mL/min (by C-G formula based on SCr of 0.61 mg/dL). 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 06/25/2019 06/25/2019   Chronic insomnia 04/04/2012   Chronic low back pain 04/04/2012   Depression 04/04/2012   History of colonic polyps    Candidate for statin therapy due to risk of future cardiovascular event 05/11/2021   Coronary artery disease involving native heart without angina pectoris 05/11/2021   Atherosclerosis 05/11/2021   Pulmonary emphysema ([place]) 05/11/2021   Current every day smoker 05/11/2021   Smoking greater than 30 pack years 05/11/2021   Peritoneal carcinomatosis ([place]) 10/03/2021   Weakness 11/10/2021   Pneumonia 11/10/2021   Palliative care encounter    Protein-calorie malnutrition, severe 11/14/2021   Chronic obstructive pulmonary disease (HCC)    Ascites, malignant    Resolved Ambulatory Problems    Diagnosis Date Noted   Anemia 03/30/2019   Past Medical History:  Diagnosis Date   Asthma    Back pain    Blood transfusion without reported diagnosis     SOCIAL HX:  Social History   Tobacco Use   Smoking status: Every Day    Packs/day: 0.50    Years: 40.00    Pack years: 20.00    Types: Cigarettes    Start date: 08/02/2009    Passive exposure: Past   Smokeless tobacco: Never   Tobacco comments:    [place] smoking cessation program info provided  Substance Use Topics    Alcohol use: Not Currently     FAMILY HX:  Family History  Problem Relation Age of Onset   Dementia Mother    Liver cancer Father       ALLERGIES:  Allergies  Allergen Reactions   Bee Venom Anaphylaxis   Penicillins Anaphylaxis and Swelling    Did it involve swelling of the face/tongue/throat, SOB, or low BP? Yes Did it involve sudden or severe rash/hives, skin peeling, or any reaction on the inside of your mouth or nose? No Did you need to seek medical attention at a hospital or doctor's office? Yes When did it last happen?  Many years ago If all above answers are "NO", may proceed with cephalosporin use.       PERTINENT MEDICATIONS:  Outpatient Encounter Medications as of 12/05/2021  Medication Sig   albuterol (VENTOLIN HFA) 108 (90 Base) MCG/ACT inhaler Inhale 2 puffs into the lungs every 6 (six) hours as needed for wheezing or shortness of breath.   aspirin EC 81 MG tablet Take 81 mg by mouth daily. Swallow whole.   aspirin EC 81 MG tablet Take 81 mg by mouth daily. Swallow whole.   Cholecalciferol (VITAMIN D) 50  MCG (2000 UT) CAPS Take 2,000 Units by mouth daily. (Patient not taking: Reported on 11/29/2021)   cyclobenzaprine (FLEXERIL) 10 MG tablet Take 10 mg by mouth at bedtime.   cyclobenzaprine (FLEXERIL) 10 MG tablet Take 10 mg by mouth 3 (three) times daily as needed for muscle spasms.   dexamethasone (DECADRON) 4 MG tablet Take 2 tablets (8 mg total) by mouth daily. Start the day after chemotherapy for 2 days. Take with food. (Patient not taking: Reported on 11/10/2021)   doxepin (SINEQUAN) 25 MG capsule Take 25 mg by mouth at bedtime.   fluticasone-salmeterol (ADVAIR) 250-50 MCG/ACT AEPB Inhale 1 puff into the lungs in the morning and at bedtime.   gabapentin (NEURONTIN) 600 MG tablet Take 1,800 mg by mouth at bedtime.    ipratropium-albuterol (DUONEB) 0.5-2.5 (3) MG/3ML SOLN Take 3 mLs by nebulization every 6 (six) hours as needed.   ipratropium-albuterol (DUONEB) 0.5-2.5  (3) MG/3ML SOLN Take 3 mLs by nebulization 3 (three) times daily for 7 days.   lidocaine-prilocaine (EMLA) cream Apply to affected area once   loratadine (CLARITIN) 10 MG tablet Take 1 tablet (10 mg total) by mouth daily.   melatonin 3 MG TABS tablet Take 3 mg by mouth at bedtime.   morphine (MS CONTIN) 60 MG 12 hr tablet Take 1 tablet (60 mg total) by mouth every 12 (twelve) hours.   nicotine (NICODERM CQ - DOSED IN MG/24 HOURS) 21 mg/24hr patch Place 1 patch (21 mg total) onto the skin daily.   ondansetron (ZOFRAN) 8 MG tablet Take 1 tablet (8 mg total) by mouth 2 (two) times daily as needed for refractory nausea / vomiting. Start on day 3 after chemotherapy.   oxyCODONE-acetaminophen (PERCOCET/ROXICET) 5-325 MG tablet Take 1 tablet by mouth every 4 (four) hours as needed for severe pain.   pantoprazole (PROTONIX) 20 MG tablet Take 2 tablets (40 mg total) by mouth daily.   prochlorperazine (COMPAZINE) 10 MG tablet Take 1 tablet (10 mg total) by mouth every 6 (six) hours as needed (Nausea or vomiting).   senna-docusate (SENOKOT-S) 8.6-50 MG tablet Take 1 tablet by mouth 2 (two) times daily. (Patient not taking: Reported on 11/29/2021)   tiotropium (SPIRIVA HANDIHALER) 18 MCG inhalation capsule Place 1 capsule (18 mcg total) into inhaler and inhale daily.   No facility-administered encounter medications on file as of 12/05/2021.   I spent 50 minutes providing this consultation; time includes spent with patient/family, chart review and documentation. More than 50% of the time in this consultation was spent on care coordination   Thank you for the opportunity to participate in the care of Roberto Collins.  The palliative care team will continue to follow. Please call our office at [phone] if we can be of additional assistance.   Note: Portions of this note were generated with Lobbyist. Dictation errors may occur despite best attempts at proofreading.  Teodoro Spray, NP

## 2021-12-06 ENCOUNTER — Telehealth: Payer: Medicare PPO | Admitting: Hospice and Palliative Medicine

## 2021-12-08 DIAGNOSIS — G8929 Other chronic pain: Secondary | ICD-10-CM | POA: Diagnosis not present

## 2021-12-08 DIAGNOSIS — F5104 Psychophysiologic insomnia: Secondary | ICD-10-CM | POA: Diagnosis not present

## 2021-12-08 DIAGNOSIS — F17210 Nicotine dependence, cigarettes, uncomplicated: Secondary | ICD-10-CM | POA: Diagnosis not present

## 2021-12-08 DIAGNOSIS — F32A Depression, unspecified: Secondary | ICD-10-CM | POA: Diagnosis not present

## 2021-12-08 DIAGNOSIS — J449 Chronic obstructive pulmonary disease, unspecified: Secondary | ICD-10-CM | POA: Diagnosis not present

## 2021-12-11 DIAGNOSIS — C786 Secondary malignant neoplasm of retroperitoneum and peritoneum: Secondary | ICD-10-CM | POA: Diagnosis not present

## 2021-12-11 DIAGNOSIS — L89890 Pressure ulcer of other site, unstageable: Secondary | ICD-10-CM | POA: Diagnosis not present

## 2021-12-11 DIAGNOSIS — E43 Unspecified severe protein-calorie malnutrition: Secondary | ICD-10-CM | POA: Diagnosis not present

## 2021-12-11 DIAGNOSIS — R180 Malignant ascites: Secondary | ICD-10-CM | POA: Diagnosis not present

## 2021-12-12 DIAGNOSIS — F331 Major depressive disorder, recurrent, moderate: Secondary | ICD-10-CM | POA: Diagnosis not present

## 2021-12-12 DIAGNOSIS — F064 Anxiety disorder due to known physiological condition: Secondary | ICD-10-CM | POA: Diagnosis not present

## 2021-12-13 ENCOUNTER — Encounter: Payer: Self-pay | Admitting: Internal Medicine

## 2021-12-13 ENCOUNTER — Telehealth: Payer: Self-pay | Admitting: Hospice and Palliative Medicine

## 2021-12-13 DIAGNOSIS — I499 Cardiac arrhythmia, unspecified: Secondary | ICD-10-CM | POA: Diagnosis not present

## 2021-12-13 DIAGNOSIS — F32A Depression, unspecified: Secondary | ICD-10-CM | POA: Diagnosis not present

## 2021-12-13 DIAGNOSIS — F17210 Nicotine dependence, cigarettes, uncomplicated: Secondary | ICD-10-CM | POA: Diagnosis not present

## 2021-12-13 DIAGNOSIS — C20 Malignant neoplasm of rectum: Secondary | ICD-10-CM | POA: Diagnosis not present

## 2021-12-13 DIAGNOSIS — R404 Transient alteration of awareness: Secondary | ICD-10-CM | POA: Diagnosis not present

## 2021-12-13 DIAGNOSIS — Z743 Need for continuous supervision: Secondary | ICD-10-CM | POA: Diagnosis not present

## 2021-12-13 DIAGNOSIS — R0602 Shortness of breath: Secondary | ICD-10-CM | POA: Diagnosis not present

## 2021-12-13 DIAGNOSIS — F5104 Psychophysiologic insomnia: Secondary | ICD-10-CM | POA: Diagnosis not present

## 2021-12-13 DIAGNOSIS — I469 Cardiac arrest, cause unspecified: Secondary | ICD-10-CM | POA: Diagnosis not present

## 2021-12-13 DIAGNOSIS — J439 Emphysema, unspecified: Secondary | ICD-10-CM | POA: Diagnosis not present

## 2021-12-13 DIAGNOSIS — R1312 Dysphagia, oropharyngeal phase: Secondary | ICD-10-CM | POA: Diagnosis not present

## 2021-12-13 DIAGNOSIS — J449 Chronic obstructive pulmonary disease, unspecified: Secondary | ICD-10-CM | POA: Diagnosis not present

## 2021-12-13 DIAGNOSIS — G8929 Other chronic pain: Secondary | ICD-10-CM | POA: Diagnosis not present

## 2021-12-13 DIAGNOSIS — K5730 Diverticulosis of large intestine without perforation or abscess without bleeding: Secondary | ICD-10-CM | POA: Diagnosis not present

## 2021-12-14 DIAGNOSIS — J439 Emphysema, unspecified: Secondary | ICD-10-CM | POA: Diagnosis not present

## 2021-12-14 DIAGNOSIS — F32A Depression, unspecified: Secondary | ICD-10-CM | POA: Diagnosis not present

## 2021-12-14 DIAGNOSIS — K5730 Diverticulosis of large intestine without perforation or abscess without bleeding: Secondary | ICD-10-CM | POA: Diagnosis not present

## 2021-12-14 DIAGNOSIS — F5104 Psychophysiologic insomnia: Secondary | ICD-10-CM | POA: Diagnosis not present

## 2021-12-14 DIAGNOSIS — C20 Malignant neoplasm of rectum: Secondary | ICD-10-CM | POA: Diagnosis not present

## 2021-12-14 DIAGNOSIS — R1312 Dysphagia, oropharyngeal phase: Secondary | ICD-10-CM | POA: Diagnosis not present

## 2021-12-14 DIAGNOSIS — J449 Chronic obstructive pulmonary disease, unspecified: Secondary | ICD-10-CM | POA: Diagnosis not present

## 2021-12-14 DIAGNOSIS — G8929 Other chronic pain: Secondary | ICD-10-CM | POA: Diagnosis not present

## 2021-12-14 DIAGNOSIS — F17210 Nicotine dependence, cigarettes, uncomplicated: Secondary | ICD-10-CM | POA: Diagnosis not present

## 2021-12-30 NOTE — Telephone Encounter (Signed)
I called and spoke with patient's sister-in-law.  She was with patient. ? ?She had some questions about IV fluids but says that she is more comfortable with focusing on comfort/best supportive care after hearing from Dr. Rogue Bussing earlier this morning.  We discussed hospice involvement and they plan to move forward with hospice. ?

## 2021-12-30 NOTE — Progress Notes (Signed)
Returned patients family phone call. Unable to reach left a voicemail stating that IV fluids will not be very helpful given patience, low, albumin/abdominal cancer/ascites.  ? ?Josh, I would appreciate if you can reach Anne Ng /sister-in-law- and reiterate, the same. ? ?Thank you , GB

## 2021-12-30 DEATH — deceased

## 2022-01-01 ENCOUNTER — Inpatient Hospital Stay: Payer: Medicare PPO | Attending: Internal Medicine | Admitting: Hospice and Palliative Medicine

## 2022-01-01 DIAGNOSIS — Z515 Encounter for palliative care: Secondary | ICD-10-CM

## 2022-01-01 NOTE — Progress Notes (Signed)
I spoke with patient's sister-in-law.  She reports that patient died on 25-Dec-2022.  We will have chart updated to reflect this status.  Condolences given to family ?

## 2022-02-22 ENCOUNTER — Ambulatory Visit: Payer: Medicare HMO

## 2024-01-01 IMAGING — US US ABDOMEN LIMITED
1 series · 4 of 4 positions shown · non-contrast
Comparison: None.

CLINICAL DATA: 71-year-old male referred for possible paracentesis
with known malignancy.

EXAM:
LIMITED ABDOMEN ULTRASOUND FOR ASCITES
TECHNIQUE: Limited ultrasound survey for ascites was performed in all four
abdominal quadrants.

[Series 1: us abdomen limited · 0.19mm/px · 4 of 4 slices shown]
[im 1/4]
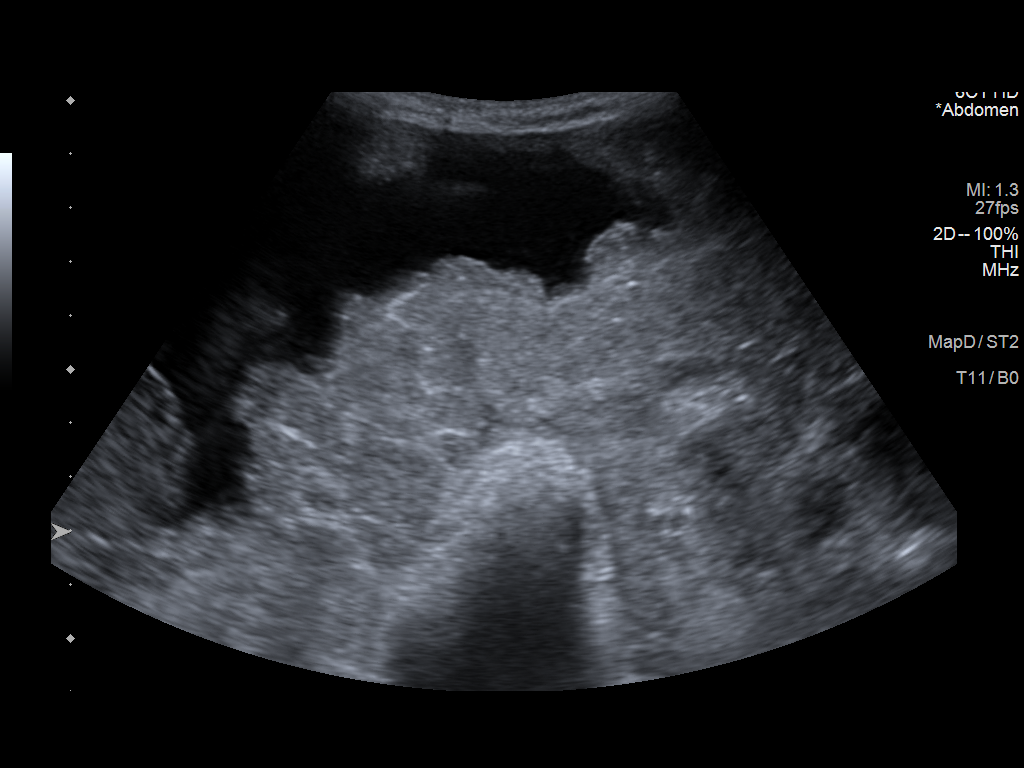
[im 2/4]
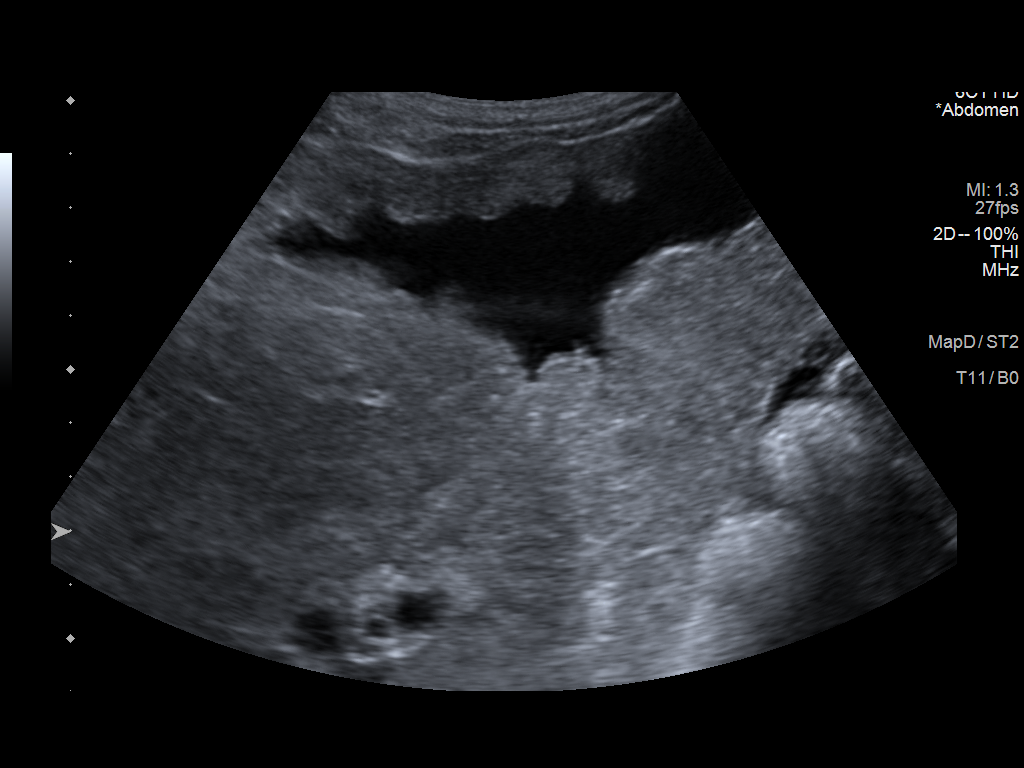
[im 3/4]
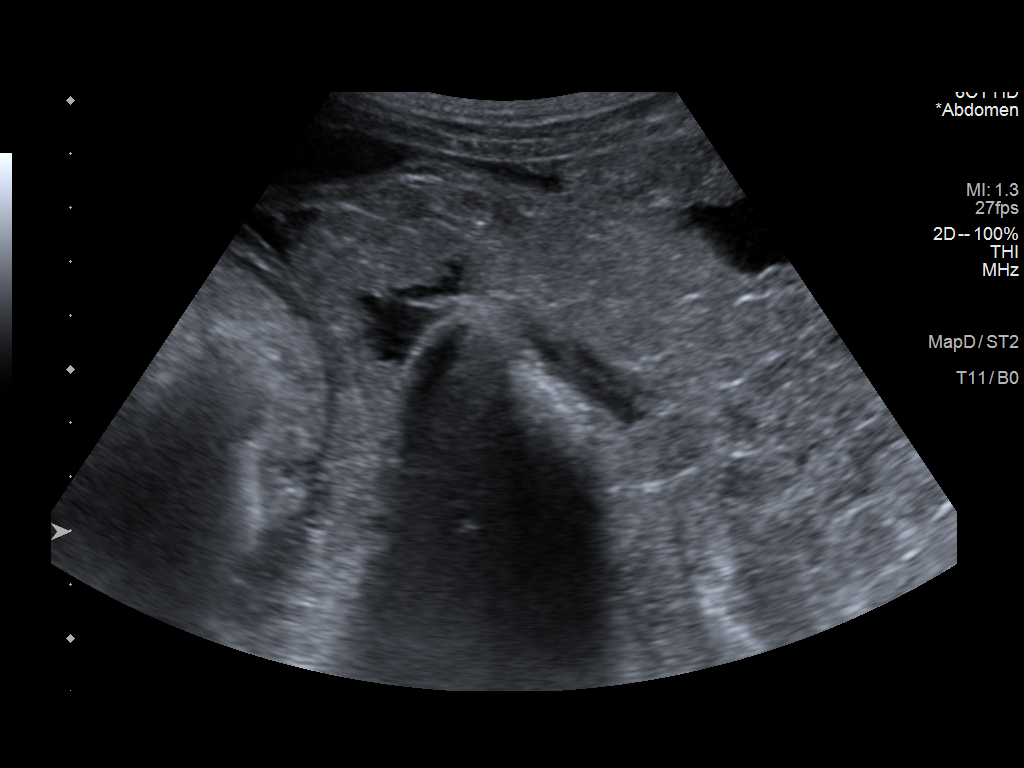
[im 4/4]
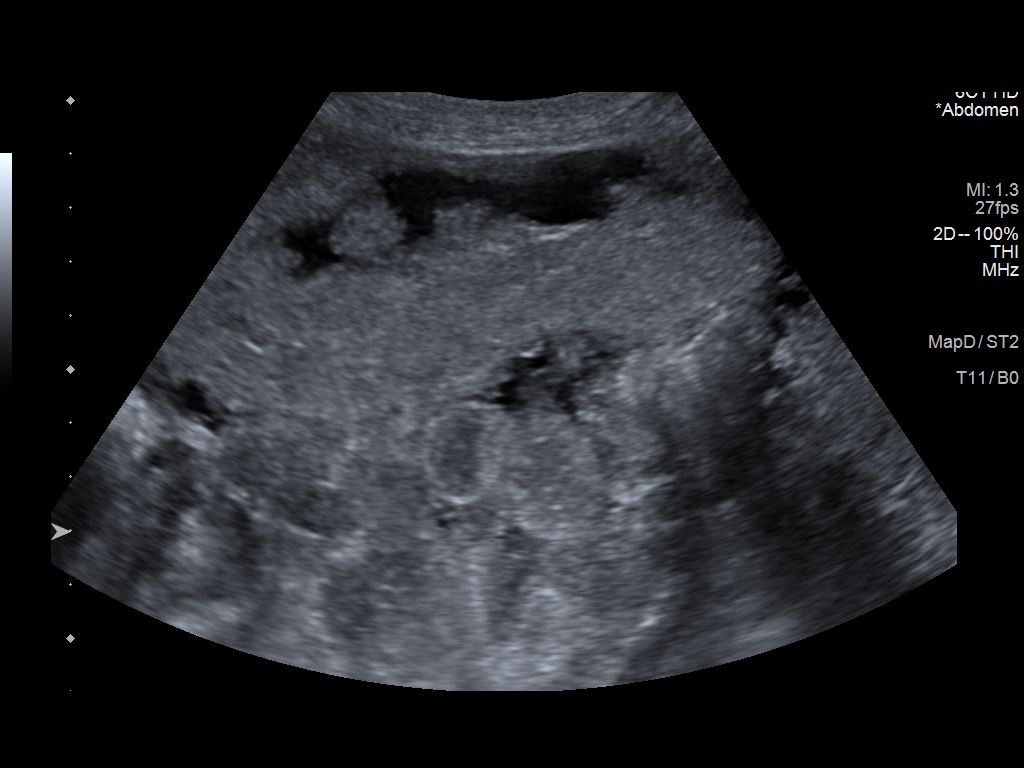

[4 of 4 positions shown; findings below may reference images not displayed]

FINDINGS: Limited ultrasound demonstrates small volume ascites, with
significant peritoneal soft tissue compatible with metastases.
IMPRESSION: Small volume ascites by ultrasound, with evidence of significant
peritoneal metastases.
